# Patient Record
Sex: Male | Born: 1950 | Race: White | Hispanic: No | Marital: Married | Smoking: Never smoker
Health system: Southern US, Community
[De-identification: ages and names within clinical notes are randomized; demographics above are authoritative.]

## PROBLEM LIST (undated history)

## (undated) DIAGNOSIS — G20 Parkinson's disease: Secondary | ICD-10-CM

## (undated) DIAGNOSIS — I10 Essential (primary) hypertension: Secondary | ICD-10-CM

## (undated) DIAGNOSIS — Z87442 Personal history of urinary calculi: Secondary | ICD-10-CM

## (undated) DIAGNOSIS — N400 Enlarged prostate without lower urinary tract symptoms: Secondary | ICD-10-CM

## (undated) DIAGNOSIS — C4490 Unspecified malignant neoplasm of skin, unspecified: Secondary | ICD-10-CM

## (undated) DIAGNOSIS — N200 Calculus of kidney: Secondary | ICD-10-CM

## (undated) DIAGNOSIS — E785 Hyperlipidemia, unspecified: Secondary | ICD-10-CM

## (undated) DIAGNOSIS — I959 Hypotension, unspecified: Secondary | ICD-10-CM

## (undated) DIAGNOSIS — F329 Major depressive disorder, single episode, unspecified: Secondary | ICD-10-CM

## (undated) DIAGNOSIS — N183 Chronic kidney disease, stage 3 unspecified: Secondary | ICD-10-CM

## (undated) DIAGNOSIS — Z87898 Personal history of other specified conditions: Secondary | ICD-10-CM

## (undated) DIAGNOSIS — R55 Syncope and collapse: Secondary | ICD-10-CM

## (undated) DIAGNOSIS — R413 Other amnesia: Secondary | ICD-10-CM

## (undated) DIAGNOSIS — M1990 Unspecified osteoarthritis, unspecified site: Secondary | ICD-10-CM

## (undated) DIAGNOSIS — G4710 Hypersomnia, unspecified: Secondary | ICD-10-CM

## (undated) DIAGNOSIS — K219 Gastro-esophageal reflux disease without esophagitis: Secondary | ICD-10-CM

## (undated) HISTORY — DX: Major depressive disorder, single episode, unspecified: F32.9

## (undated) HISTORY — PX: TONSILLECTOMY: SUR1361

## (undated) HISTORY — DX: Gastro-esophageal reflux disease without esophagitis: K21.9

## (undated) HISTORY — DX: Parkinson's disease: G20

## (undated) HISTORY — DX: Hyperlipidemia, unspecified: E78.5

## (undated) HISTORY — PX: OTHER SURGICAL HISTORY: SHX169

## (undated) HISTORY — DX: Unspecified malignant neoplasm of skin, unspecified: C44.90

## (undated) HISTORY — DX: Hypersomnia, unspecified: G47.10

## (undated) HISTORY — DX: Other amnesia: R41.3

## (undated) HISTORY — DX: Essential (primary) hypertension: I10

---

## 2002-12-08 ENCOUNTER — Encounter: Payer: Self-pay | Admitting: Internal Medicine

## 2002-12-08 ENCOUNTER — Encounter: Admission: RE | Admit: 2002-12-08 | Discharge: 2002-12-08 | Payer: Self-pay | Admitting: Internal Medicine

## 2003-06-08 ENCOUNTER — Emergency Department (HOSPITAL_COMMUNITY): Admission: EM | Admit: 2003-06-08 | Discharge: 2003-06-09 | Payer: Self-pay | Admitting: Emergency Medicine

## 2005-04-09 ENCOUNTER — Ambulatory Visit: Payer: Self-pay | Admitting: Internal Medicine

## 2007-06-07 ENCOUNTER — Ambulatory Visit (HOSPITAL_COMMUNITY): Admission: RE | Admit: 2007-06-07 | Discharge: 2007-06-07 | Payer: Self-pay | Admitting: Neurosurgery

## 2007-11-30 ENCOUNTER — Ambulatory Visit (HOSPITAL_COMMUNITY): Admission: RE | Admit: 2007-11-30 | Discharge: 2007-11-30 | Payer: Self-pay | Admitting: Neurosurgery

## 2007-12-06 ENCOUNTER — Ambulatory Visit (HOSPITAL_COMMUNITY): Admission: RE | Admit: 2007-12-06 | Discharge: 2007-12-06 | Payer: Self-pay | Admitting: Neurosurgery

## 2007-12-07 ENCOUNTER — Inpatient Hospital Stay (HOSPITAL_COMMUNITY): Admission: RE | Admit: 2007-12-07 | Discharge: 2007-12-08 | Payer: Self-pay | Admitting: Neurosurgery

## 2007-12-21 ENCOUNTER — Ambulatory Visit (HOSPITAL_COMMUNITY): Admission: RE | Admit: 2007-12-21 | Discharge: 2007-12-22 | Payer: Self-pay | Admitting: Neurosurgery

## 2008-04-24 ENCOUNTER — Encounter: Payer: Self-pay | Admitting: Internal Medicine

## 2008-07-18 ENCOUNTER — Encounter: Payer: Self-pay | Admitting: Internal Medicine

## 2008-10-06 ENCOUNTER — Encounter: Payer: Self-pay | Admitting: Internal Medicine

## 2009-01-09 ENCOUNTER — Encounter: Payer: Self-pay | Admitting: Internal Medicine

## 2009-04-09 ENCOUNTER — Encounter: Payer: Self-pay | Admitting: Internal Medicine

## 2009-10-09 ENCOUNTER — Encounter: Payer: Self-pay | Admitting: Internal Medicine

## 2010-01-11 ENCOUNTER — Ambulatory Visit (HOSPITAL_COMMUNITY): Admission: RE | Admit: 2010-01-11 | Discharge: 2010-01-11 | Payer: Self-pay | Admitting: Neurosurgery

## 2010-01-25 ENCOUNTER — Encounter: Payer: Self-pay | Admitting: Internal Medicine

## 2010-05-21 NOTE — Letter (Signed)
Summary: [facility]  [facility]   Imported By: Sherian Rein 10/12/2009 10:37:47  _____________________________________________________________________  External Attachment:    Type:   Image     Comment:   External Document

## 2010-05-21 NOTE — Consult Note (Signed)
Summary: [facility]  [facility]   Imported By: Lennie Odor 01/31/2010 14:56:20  _____________________________________________________________________  External Attachment:    Type:   Image     Comment:   External Document

## 2010-05-29 ENCOUNTER — Encounter: Payer: Self-pay | Admitting: Internal Medicine

## 2010-06-12 NOTE — Letter (Signed)
Summary: Avie Echevaria MD/[facility]  Avie Echevaria MD/[facility]   Imported By: Lester Mitchell 06/03/2010 10:30:35  _____________________________________________________________________  External Attachment:    Type:   Image     Comment:   External Document

## 2010-07-04 LAB — CBC
MCHC: 34.7 g/dL (ref 30.0–36.0)
Platelets: 203 10*3/uL (ref 150–400)
RBC: 5.05 MIL/uL (ref 4.22–5.81)
RDW: 14 % (ref 11.5–15.5)
WBC: 7.9 10*3/uL (ref 4.0–10.5)

## 2010-07-04 LAB — SURGICAL PCR SCREEN: Staphylococcus aureus: NEGATIVE

## 2010-09-03 NOTE — Op Note (Signed)
John Herring, John Herring               ACCOUNT NO.:  [id number]   MEDICAL RECORD NO.:  [id number]          PATIENT TYPE:  OIB   LOCATION:  3535                         FACILITY:  [facility]   PHYSICIAN:  Danae Orleans. Venetia Maxon, M.D.  DATE OF BIRTH:  1950/10/05   DATE OF PROCEDURE:  12/21/2007  DATE OF DISCHARGE:                               OPERATIVE REPORT   PREOPERATIVE DIAGNOSIS:  Status post stage I deep brain stimulator  placement for Parkinson's disease.   POSTOPERATIVE DIAGNOSIS:  Status post stage I deep brain stimulator  placement for Parkinson's disease.   PROCEDURE:  Stage II bilateral deep brain stimulator implantable pulse  generator placement for Parkinson's.   SURGEON:  Danae Orleans. Venetia Maxon, MD   ASSISTANT:  Georgiann Cocker, RN   ANESTHESIA:  General endotracheal anesthesia.   ESTIMATED BLOOD LOSS:  Minimal.   COMPLICATIONS:  None.   DISPOSITION:  Recovery.   INDICATIONS:  John Herring is a 60 year old man with Parkinson's  disease and dyskinesias.  He had previously undergone a successful stage  I bilateral deep brain stimulator placement for Parkinson's and comes  back to the operating room for planned second stage with implantable  pulse generator and extension placement for bilateral deep brain  stimulator.   PROCEDURE:  John Herring was brought to the operating room.  Following a  satisfactory and uncomplicated induction of general endotracheal  anesthesia and placement of intravenous lines, he was placed with a  right shoulder bump on a blanket roll and doughnut head holder.  His  previously placed cranial staples were removed.  His frontal, parietal,  postauricular, and right upper chest areas were then prepped and draped  with Betadine scrub and paint.  Areas of planned incision were  infiltrated with local lidocaine.  Incision was made in the  postauricular region overlying the previously placed extensions and a  subcutaneous pocket was created over his right upper  chest.  The  tunneler was used to pass the 40 cm extensions and then these were  hooked to the lead extensions with Silastic covers, a white cover was  placed on the right lead, and clear cover was placed on the left lead.  The connections were torqued appropriately, and Silastic cover was  placed and 2-0 silk ties were placed on either end of the covers.  The  03 connections were placed on the left lead.  The 437 connections were  placed on the right lead with a connector battery and the connections  were torqued appropriately.  The wounds were then irrigated.  The  battery  was placed within the subcutaneous pocket.  The wounds were irrigated  and closed with interrupted 2-0 and 3-0 Vicryl sutures.  Chest and 2-0  Vicryl stitches reapproximated to the galea and then 3-0 nylon running  lock stitch.  Sterile occlusive dressing was placed.  The patient was  taken to the recovery having tolerated the procedure well.      Danae Orleans. Venetia Maxon, M.D.  Electronically Signed     JDS/MEDQ  D:  12/21/2007  T:  12/22/2007  Job:  [id number]

## 2010-09-03 NOTE — Op Note (Signed)
NAMELIBAN, GUEDES               ACCOUNT NO.:  [id number]   MEDICAL RECORD NO.:  [id number]          PATIENT TYPE:  AMB   LOCATION:  SDS                          FACILITY:  [facility]   PHYSICIAN:  Danae Orleans. Venetia Maxon, M.D.  DATE OF BIRTH:  Aug 23, 1950   DATE OF PROCEDURE:  12/07/2007  DATE OF DISCHARGE:  12/08/2007                               OPERATIVE REPORT   PREOPERATIVE DIAGNOSIS:  Parkinson's disease refractory to medications.   POSTOPERATIVE DIAGNOSIS:  Parkinson's disease refractory to medications.   PROCEDURE:  CT and MRI guided stereotactic subthalamic nucleus deep  brain stimulator placement with microelectrode recordings.   SURGEON:  Danae Orleans. Venetia Maxon, MD   ASSISTANT:  Georgiann Cocker, RN   ANESTHESIA:  Monitored sedation with local lidocaine.   FINDINGS:  Excellent microelectrode recordings through long stretches of  subthalamic nucleus bilaterally with good response to stimulation  bilaterally.   ESTIMATED BLOOD LOSS:  Minimal.   COMPLICATIONS:  None.   DISPOSITION:  Recovery.   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  He has been managed medically, but is  having progressively increasing problems with dyskinesias.  It was  elected to take him to surgery for placement of bilateral deep brain  stimulator electrodes.  This was to be done in a frameless fashion.  He  initially underwent an MRI of his brain without fiducials then had  fiducial markers placed in his skull in the office yesterday, had a CT  scan with fiducials and then preoperative planning was done the night  before surgery on the Stealth system to planned trajectories in  targeting for bilateral subthalamic nucleus electrodes.   PROCEDURE:  John Herring was brought to the operating room and placed in  supine semi-Fowler's position on the neck holder and with intravenous  sedation using Precedex.  The patient then had registration of his skull  with the fiducial markers to an accuracy of 0.4 mm.  I then prepped his  scalp after positioning the planned entry points and infiltrated the  skin with lidocaine with epinephrine, scored the scalp, and then drilled  2 marker holes in the skull to orient the bur holes.  The patient's  scalp was then prepped and draped in the usual sterile fashion and area  of planned incision was infiltrated with local lidocaine with  epinephrine.  A linear incision was made posterior to the coronal suture  curving forward on either ends and Raney clips were applied.  The scalp  was brought forward and the previously placed skull markers were  identified and the bur holes were then placed.  The dura was cauterized  with bipolar electrocautery and the bone bleeding was stopped with bone  wax.  The stim-lock caps were then placed and then the tower was created  on the left initially.  The dura was incised in a cruciate fashion.  Erie Noe  was cauterized with bipolar electrocautery, and using the Stealth  neuronavigation system, the left STN trajectory was established and with  planning to target, the microelectrode drive was then set at 87 mm  which  was the planned length to target, and using the microelectrode  recording, the microelectrode was started at 15 mm above target, and  initially the microelectrode recordings were suboptimal and the  electrode was changed and had much better recordings.  We received  indications of thalamic activity at 13 mm above target to 10 mm above  target and then subthalamic nucleus from 6 mm above target to 2 mm below  target and entered substantia nigra reticulata at 3.5 mm below target.  It was elected to put the stimulator to a depth of 2 mm below target and  the stimulating electrode was then placed, and with a stimulating  electrode at relatively low settings, the patient had complete cessation  of tremor and no evidence of rigidity.  On the right side of his body,  he had  transient paresthesias in his fingers and tips of fingers which  rapidly resolved.  The electrode was then locked in position with the  skin lock cap and the tower was disassembled.  A long extension was then  placed and the attention was then turned to the right side where the  tower was again reassembled.  The skull was reregistered, again with 0.4  mm accuracy, and microelectrode recordings were performed after  cauterizing the dura opening in a cruciate fashion and cauterizing the  pia.  On the right side, there was thalamic activity from 13 mm above  target to 8 mm above target and subthalamic nucleus activity from 4 mm  above target to 1.5 mm below target.  This made sense based on the fact  that we had taken somewhat more lateral and anterior trajectory to avoid  a sulcus on the initial plan.  We entered substantia nigra reticulata at  3.5 mm below target.  We set the depth of the stimulating electrode to  1.5 mm below target and then placed the stimulating electrode and with  that the patient had good relief of left-sided tremor and rigidity.  He  had transient paresthesias in both his hand and in his leg, more in the  hand than the leg and these resolved rapidly.  It was felt that this  electrode was well positioned and was locked down and the tower was  disassembled.  The caps were placed.  The redundant electrode was  circularized and placed in the right subgaleal pocket on the posterior  auricular region.  The right-sided extension was cut short to be able to  differentiate from the left side.  The wound was then irrigated and  closed with 2-0 Vicryl galea stitches and staples.  The fiducial markers  were removed.  Sterile occlusive head wrap was placed.  The patient was  taken to recovery room in stable and satisfactory condition having  tolerated the procedure well.      Danae Orleans. Venetia Maxon, M.D.  Electronically Signed     JDS/MEDQ  D:  12/07/2007  T:  12/08/2007  Job:   [id number]

## 2011-01-17 LAB — CBC
Hemoglobin: 16.1
MCHC: 33.7
MCV: 99
RBC: 4.84
WBC: 7.5

## 2011-01-20 ENCOUNTER — Other Ambulatory Visit: Payer: Self-pay | Admitting: Neurology

## 2011-01-20 ENCOUNTER — Ambulatory Visit
Admission: RE | Admit: 2011-01-20 | Discharge: 2011-01-20 | Disposition: A | Discharge status: Home / Self Care | Payer: Medicare Other | Source: Ambulatory Visit | Attending: Neurology | Admitting: Neurology

## 2011-01-20 DIAGNOSIS — M79671 Pain in right foot: Secondary | ICD-10-CM

## 2011-04-22 HISTORY — PX: COLONOSCOPY: SHX174

## 2011-05-05 ENCOUNTER — Telehealth: Payer: Self-pay

## 2011-05-05 DIAGNOSIS — Z0000 Encounter for general adult medical examination without abnormal findings: Secondary | ICD-10-CM

## 2011-05-05 DIAGNOSIS — Z1289 Encounter for screening for malignant neoplasm of other sites: Secondary | ICD-10-CM

## 2011-05-05 NOTE — Telephone Encounter (Signed)
Put order in for physical labs. 

## 2011-05-06 ENCOUNTER — Encounter: Payer: Self-pay | Admitting: Internal Medicine

## 2011-05-06 DIAGNOSIS — G20 Parkinson's disease: Secondary | ICD-10-CM

## 2011-05-06 DIAGNOSIS — F32A Depression, unspecified: Secondary | ICD-10-CM

## 2011-05-06 DIAGNOSIS — G4733 Obstructive sleep apnea (adult) (pediatric): Secondary | ICD-10-CM | POA: Insufficient documentation

## 2011-05-06 DIAGNOSIS — E785 Hyperlipidemia, unspecified: Secondary | ICD-10-CM

## 2011-05-06 DIAGNOSIS — G20A1 Parkinson's disease without dyskinesia, without mention of fluctuations: Secondary | ICD-10-CM

## 2011-05-06 DIAGNOSIS — F329 Major depressive disorder, single episode, unspecified: Secondary | ICD-10-CM | POA: Insufficient documentation

## 2011-05-06 DIAGNOSIS — G4710 Hypersomnia, unspecified: Secondary | ICD-10-CM

## 2011-05-06 DIAGNOSIS — C4490 Unspecified malignant neoplasm of skin, unspecified: Secondary | ICD-10-CM

## 2011-05-06 HISTORY — DX: Unspecified malignant neoplasm of skin, unspecified: C44.90

## 2011-05-06 HISTORY — DX: Parkinson's disease: G20

## 2011-05-06 HISTORY — DX: Hypersomnia, unspecified: G47.10

## 2011-05-06 HISTORY — DX: Hyperlipidemia, unspecified: E78.5

## 2011-05-06 HISTORY — DX: Parkinson's disease without dyskinesia, without mention of fluctuations: G20.A1

## 2011-05-06 HISTORY — DX: Depression, unspecified: F32.A

## 2011-06-08 ENCOUNTER — Encounter: Payer: Self-pay | Admitting: Internal Medicine

## 2011-06-08 DIAGNOSIS — Z0000 Encounter for general adult medical examination without abnormal findings: Secondary | ICD-10-CM | POA: Insufficient documentation

## 2011-06-08 DIAGNOSIS — Z0001 Encounter for general adult medical examination with abnormal findings: Secondary | ICD-10-CM | POA: Insufficient documentation

## 2011-06-13 ENCOUNTER — Encounter: Payer: Self-pay | Admitting: Internal Medicine

## 2011-06-13 ENCOUNTER — Other Ambulatory Visit (INDEPENDENT_AMBULATORY_CARE_PROVIDER_SITE_OTHER): Payer: BC Managed Care – PPO

## 2011-06-13 ENCOUNTER — Ambulatory Visit (INDEPENDENT_AMBULATORY_CARE_PROVIDER_SITE_OTHER): Payer: BC Managed Care – PPO | Admitting: Internal Medicine

## 2011-06-13 VITALS — BP 112/78 | HR 93 | Temp 98.8°F | Ht 70.0 in | Wt 189.5 lb

## 2011-06-13 DIAGNOSIS — Z0000 Encounter for general adult medical examination without abnormal findings: Secondary | ICD-10-CM

## 2011-06-13 DIAGNOSIS — F329 Major depressive disorder, single episode, unspecified: Secondary | ICD-10-CM

## 2011-06-13 DIAGNOSIS — M25519 Pain in unspecified shoulder: Secondary | ICD-10-CM

## 2011-06-13 DIAGNOSIS — M25512 Pain in left shoulder: Secondary | ICD-10-CM | POA: Insufficient documentation

## 2011-06-13 DIAGNOSIS — F1020 Alcohol dependence, uncomplicated: Secondary | ICD-10-CM

## 2011-06-13 DIAGNOSIS — F3289 Other specified depressive episodes: Secondary | ICD-10-CM

## 2011-06-13 DIAGNOSIS — M25511 Pain in right shoulder: Secondary | ICD-10-CM

## 2011-06-13 LAB — URINALYSIS, ROUTINE W REFLEX MICROSCOPIC
Bilirubin Urine: NEGATIVE
Hgb urine dipstick: NEGATIVE
Total Protein, Urine: NEGATIVE
Urine Glucose: NEGATIVE
Urobilinogen, UA: 0.2 (ref 0.0–1.0)

## 2011-06-13 LAB — BASIC METABOLIC PANEL
CO2: 25 mEq/L (ref 19–32)
Calcium: 9.8 mg/dL (ref 8.4–10.5)
Chloride: 104 mEq/L (ref 96–112)
Creatinine, Ser: 1.3 mg/dL (ref 0.4–1.5)
Sodium: 138 mEq/L (ref 135–145)

## 2011-06-13 LAB — CBC WITH DIFFERENTIAL/PLATELET
Basophils Absolute: 0 10*3/uL (ref 0.0–0.1)
Basophils Relative: 0.6 % (ref 0.0–3.0)
Eosinophils Absolute: 0 10*3/uL (ref 0.0–0.7)
Hemoglobin: 15.5 g/dL (ref 13.0–17.0)
Lymphocytes Relative: 23.8 % (ref 12.0–46.0)
MCHC: 33.6 g/dL (ref 30.0–36.0)
MCV: 97.3 fl (ref 78.0–100.0)
Monocytes Absolute: 0.6 10*3/uL (ref 0.1–1.0)
Neutro Abs: 5.3 10*3/uL (ref 1.4–7.7)
RBC: 4.72 Mil/uL (ref 4.22–5.81)
RDW: 14.1 % (ref 11.5–14.6)

## 2011-06-13 LAB — LIPID PANEL
Total CHOL/HDL Ratio: 4
VLDL: 34.6 mg/dL (ref 0.0–40.0)

## 2011-06-13 LAB — HEPATIC FUNCTION PANEL
ALT: 6 U/L (ref 0–53)
Alkaline Phosphatase: 60 U/L (ref 39–117)
Bilirubin, Direct: 0.1 mg/dL (ref 0.0–0.3)
Total Bilirubin: 0.5 mg/dL (ref 0.3–1.2)
Total Protein: 6.8 g/dL (ref 6.0–8.3)

## 2011-06-13 LAB — PSA: PSA: 2.42 ng/mL (ref 0.10–4.00)

## 2011-06-13 LAB — LDL CHOLESTEROL, DIRECT: Direct LDL: 179.1 mg/dL

## 2011-06-13 MED ORDER — ESCITALOPRAM OXALATE 10 MG PO TABS: 10.0000 mg | ORAL_TABLET | Freq: Every day | ORAL | Status: DC

## 2011-06-13 MED ORDER — MELOXICAM 15 MG PO TABS: 15.0000 mg | ORAL_TABLET | Freq: Every day | ORAL | Status: DC

## 2011-06-13 NOTE — Assessment & Plan Note (Signed)
To cont AA involvement, pt encouraged

## 2011-06-13 NOTE — Assessment & Plan Note (Signed)
Unclear etiology, for mobic prn, declines ortho eval or films,  to f/u any worsening symptoms or concerns

## 2011-06-13 NOTE — Assessment & Plan Note (Signed)
Mild, verified nonsuicidal, for lexapro 10 mg qd,  to f/u any worsening symptoms or concerns

## 2011-06-13 NOTE — Progress Notes (Signed)
Subjective:    Patient ID: John Herring, male    DOB: 1950/11/06, 61 y.o.   MRN: [id number]  HPI  Here for wellness and f/u;  Overall doing ok;  Pt denies CP, worsening SOB, DOE, wheezing, orthopnea, PND, worsening LE edema, palpitations, dizziness or syncope.  Pt denies neurological change such as new Headache, facial or extremity weakness.  Pt denies polydipsia, polyuria, or low sugar symptoms. Pt states overall good compliance with treatment and medications, good tolerability, and trying to follow lower cholesterol diet.  Pt denies worsening depressive symptoms, suicidal ideation or panic. No fever, wt loss, night sweats, loss of appetite, or other constitutional symptoms.  Pt states good ability with ADL's, low fall risk, home safety reviewed and adequate, no significant changes in hearing or vision, and occasionally active with exercise.  Sees Dr Sandria Manly every 3 mo for Parkinson's.  Has worsening depression but no suicidal ideation, or panic.  Has ongoing alcoholism as well, now quit and going to AA.  Also with bilat shoulder aching, worse intemittent, mild, without neck pain or radicular symtpoms. Past Medical History  Diagnosis Date  . 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 Hypersomnia 05/06/2011  . Hyperlipidemia 05/06/2011  . Skin cancer 05/06/2011  . Depression 05/06/2011  . OSA (obstructive sleep apnea) 05/06/2011   No past surgical history on file.  reports that he has never smoked. He does not have any smokeless tobacco history on file. He reports that he drinks alcohol. He reports that he does not use illicit drugs. family history is not on file. No Known Allergies No current outpatient prescriptions on file prior to visit.   Review of Systems Review of Systems  Constitutional: Negative for diaphoresis, activity change, appetite change and unexpected weight change.  HENT: Negative for hearing loss, ear pain, facial swelling, mouth sores and neck  stiffness.   Eyes: Negative for pain, redness and visual disturbance.  Respiratory: Negative for shortness of breath and wheezing.   Cardiovascular: Negative for chest pain and palpitations.  Gastrointestinal: Negative for diarrhea, blood in stool, abdominal distention and rectal pain.  Genitourinary: Negative for hematuria, flank pain and decreased urine volume.  Musculoskeletal: Negative for myalgias and joint swelling.  Skin: Negative for color change and wound.  Neurological: Negative for syncope and numbness.  Hematological: Negative for adenopathy.  Psychiatric/Behavioral: Negative for hallucinations, self-injury, decreased concentration and agitation.      Objective:   Physical Exam BP 112/78  Pulse 93  Temp(Src) 98.8 F (37.1 C) (Oral)  Ht 5\' 10"  (1.778 m)  Wt 189 lb 8 oz (85.957 kg)  BMI 27.19 kg/m2  SpO2 91% Physical Exam  VS noted Constitutional: Pt is oriented to person, place, and time. Appears well-developed and well-nourished.  HENT:  Head: Normocephalic and atraumatic.  Right Ear: External ear normal.  Left Ear: External ear normal.  Nose: Nose normal.  Mouth/Throat: Oropharynx is clear and moist.  Eyes: Conjunctivae and EOM are normal. Pupils are equal, round, and reactive to light.  Neck: Normal range of motion. Neck supple. No JVD present. No tracheal deviation present.  Cardiovascular: Normal rate, regular rhythm, normal heart sounds and intact distal pulses.   Pulmonary/Chest: Effort normal and breath sounds normal.  Abdominal: Soft. Bowel sounds are normal. There is no tenderness.  Musculoskeletal: Normal range of motion. Exhibits no edema.  Lymphadenopathy:  Has no cervical adenopathy.  Neurological: Pt is alert and oriented to person, place, and time. Pt has normal reflexes. No cranial  nerve deficit.  Skin: Skin is warm and dry. No rash noted.  Psychiatric:  Has depressed mood and affect. Behavior is normal.  Bilat shoulders FROM, NT    Assessment &  Plan:

## 2011-06-13 NOTE — Assessment & Plan Note (Signed)

## 2011-06-13 NOTE — Patient Instructions (Addendum)
Take all new medications as prescribed - the pain medication as needed for shoulder pain, and the lexapro 10 mg for depression Continue all other medications as before Please keep your appointments with your specialists as you have planned - Dr Love/neurology, and AA meetings You will be contacted regarding the referral for: Gastroenterology for purpose of planning the screening colonoscopy Please make Nurse Appt at any time for Shingles Shot if you wish to have this done You are otherwise up to date with prevention Please go to LAB in the Basement for the blood and/or urine tests to be done today Please call the phone number [phone] (the PhoneTree System) for results of testing in 2-3 days;  When calling, simply dial the number, and when prompted enter the MRN number above (the Medical Record Number) and the # key, then the message should start. Please return in 1 year for your yearly visit, or sooner if needed, with Lab testing done 3-5 days before

## 2011-06-15 ENCOUNTER — Other Ambulatory Visit: Payer: Self-pay | Admitting: Internal Medicine

## 2011-06-15 DIAGNOSIS — E785 Hyperlipidemia, unspecified: Secondary | ICD-10-CM

## 2011-06-15 DIAGNOSIS — Z79899 Other long term (current) drug therapy: Secondary | ICD-10-CM

## 2011-06-15 MED ORDER — ATORVASTATIN CALCIUM 20 MG PO TABS: 20.0000 mg | ORAL_TABLET | Freq: Every day | ORAL | Status: DC

## 2011-07-11 ENCOUNTER — Encounter: Payer: Self-pay | Admitting: Internal Medicine

## 2011-07-11 ENCOUNTER — Other Ambulatory Visit (INDEPENDENT_AMBULATORY_CARE_PROVIDER_SITE_OTHER): Payer: BC Managed Care – PPO

## 2011-07-11 DIAGNOSIS — Z79899 Other long term (current) drug therapy: Secondary | ICD-10-CM

## 2011-07-11 DIAGNOSIS — E785 Hyperlipidemia, unspecified: Secondary | ICD-10-CM

## 2011-07-11 LAB — LIPID PANEL
Cholesterol: 169 mg/dL (ref 0–200)
HDL: 57.6 mg/dL (ref >39.00)
LDL Cholesterol: 87 mg/dL (ref 0–99)
Total CHOL/HDL Ratio: 3
Triglycerides: 123 mg/dL (ref 0.0–149.0)
VLDL: 24.6 mg/dL (ref 0.0–40.0)

## 2011-07-11 LAB — HEPATIC FUNCTION PANEL
AST: 20 U/L (ref 0–37)
Alkaline Phosphatase: 57 U/L (ref 39–117)
Bilirubin, Direct: 0.1 mg/dL (ref 0.0–0.3)
Total Bilirubin: 0.4 mg/dL (ref 0.3–1.2)

## 2011-10-28 ENCOUNTER — Encounter: Payer: Self-pay | Admitting: Internal Medicine

## 2011-12-26 ENCOUNTER — Ambulatory Visit (AMBULATORY_SURGERY_CENTER): Payer: BC Managed Care – PPO

## 2011-12-26 VITALS — Ht 70.5 in | Wt 195.9 lb

## 2011-12-26 DIAGNOSIS — Z1211 Encounter for screening for malignant neoplasm of colon: Secondary | ICD-10-CM

## 2012-01-09 ENCOUNTER — Encounter: Payer: Self-pay | Admitting: Internal Medicine

## 2012-01-09 ENCOUNTER — Ambulatory Visit (AMBULATORY_SURGERY_CENTER): Payer: BC Managed Care – PPO | Admitting: Internal Medicine

## 2012-01-09 VITALS — BP 142/88 | HR 90 | Temp 97.3°F | Resp 19 | Ht 70.0 in | Wt 195.0 lb

## 2012-01-09 DIAGNOSIS — Z1211 Encounter for screening for malignant neoplasm of colon: Secondary | ICD-10-CM

## 2012-01-09 DIAGNOSIS — D126 Benign neoplasm of colon, unspecified: Secondary | ICD-10-CM

## 2012-01-09 MED ORDER — SODIUM CHLORIDE 0.9 % IV SOLN: 500.0000 mL | INTRAVENOUS | Status: DC

## 2012-01-09 NOTE — Patient Instructions (Addendum)

## 2012-01-09 NOTE — Progress Notes (Addendum)
Patient did not have preoperative order for IV antibiotic SSI prophylaxis. (G8918)  Patient did not experience any of the following events: a burn prior to discharge; a fall within the facility; wrong site/side/patient/procedure/implant event; or a hospital transfer or hospital admission upon discharge from the facility. (G8907)  

## 2012-01-09 NOTE — Progress Notes (Signed)
BRAIN STIMULATOR IN PLACE,WITH BATTERY PACK RT. CHEST.

## 2012-01-09 NOTE — Op Note (Signed)
[facility] [address] [place], [address]   COLONOSCOPY PROCEDURE REPORT  PATIENT: John, Herring  MR#: [id number] BIRTHDATE: [birth year] , 61  yrs. old GENDER: Male ENDOSCOPIST: Beverley Fiedler, MD REFERRED AO:ZHYQ, Fayrene Fearing PROCEDURE DATE:  01/09/2012 PROCEDURE:   Colonoscopy with cold biopsy polypectomy ASA CLASS:   Class III INDICATIONS:average risk screening and first colonoscopy. MEDICATIONS: MAC sedation, administered by CRNA and propofol (Diprivan) 200mg  IV  DESCRIPTION OF PROCEDURE:   After the risks benefits and alternatives of the procedure were thoroughly explained, informed consent was obtained.  A digital rectal exam revealed no rectal mass.   The LB CF-[id number]  endoscope was introduced through the anus and advanced to the cecum, which was identified by both the appendix and ileocecal valve. No adverse events experienced. The quality of the prep was Suprep good  The instrument was then slowly withdrawn as the colon was fully examined.   COLON FINDINGS: Mild diverticulosis was noted in the sigmoid colon. A sessile polyp measuring 4 mm in size was found in the descending colon.  A polypectomy was performed with cold forceps.  The resection was complete and the polyp tissue was completely retrieved.   Moderate sized internal hemorrhoids were found. Retroflexed views revealed internal hemorrhoids. The time to cecum=3 minutes 39 seconds.  Withdrawal time=10 minutes 27 seconds. The scope was withdrawn and the procedure completed.  COMPLICATIONS: There were no complications.  ENDOSCOPIC IMPRESSION: 1.   Mild diverticulosis was noted in the sigmoid colon 2.   Sessile polyp measuring 4 mm in size was found in the descending colon; polypectomy was performed with cold forceps 3.   Moderate sized internal hemorrhoids  RECOMMENDATIONS: 1.  Await pathology results 2.  High fiber diet 3.  If the polyp removed today is proven to be an  adenomatous (pre-cancerous) polyp, you will need a repeat colonoscopy in 5 years.  Otherwise you should continue to follow colorectal cancer screening guidelines for "routine risk" patients with colonoscopy in 10 years.  You will receive a letter within 1-2 weeks with the results of your biopsy as well as final recommendations.  Please call my office if you have not received a letter after 3 weeks.  eSigned:  Beverley Fiedler, MD 01/09/2012 10:42 AM  cc: Corwin Levins, MD and The Patient   PATIENT NAME:  John, Herring MR#: [id number]

## 2012-01-12 ENCOUNTER — Telehealth: Payer: Self-pay | Admitting: *Deleted

## 2012-01-12 NOTE — Telephone Encounter (Signed)
NO ANSWER, MESSAGE LEFT FOR THE PATIENT. 

## 2012-01-13 ENCOUNTER — Encounter: Payer: Self-pay | Admitting: Internal Medicine

## 2012-03-08 ENCOUNTER — Other Ambulatory Visit: Payer: Self-pay | Admitting: Internal Medicine

## 2012-06-08 ENCOUNTER — Other Ambulatory Visit: Payer: Self-pay | Admitting: Internal Medicine

## 2012-06-11 ENCOUNTER — Other Ambulatory Visit: Payer: Self-pay | Admitting: Internal Medicine

## 2012-06-12 ENCOUNTER — Encounter: Payer: Self-pay | Admitting: Neurology

## 2012-06-12 DIAGNOSIS — M79609 Pain in unspecified limb: Secondary | ICD-10-CM | POA: Insufficient documentation

## 2012-06-12 DIAGNOSIS — I951 Orthostatic hypotension: Secondary | ICD-10-CM

## 2012-07-12 ENCOUNTER — Encounter: Payer: Self-pay | Admitting: Neurology

## 2012-07-12 ENCOUNTER — Ambulatory Visit (INDEPENDENT_AMBULATORY_CARE_PROVIDER_SITE_OTHER): Payer: BC Managed Care – PPO | Admitting: Neurology

## 2012-07-12 VITALS — BP 124/80 | HR 80 | Temp 97.6°F | Ht 70.0 in | Wt 199.0 lb

## 2012-07-12 DIAGNOSIS — G20 Parkinson's disease: Secondary | ICD-10-CM

## 2012-07-12 DIAGNOSIS — R413 Other amnesia: Secondary | ICD-10-CM

## 2012-07-12 DIAGNOSIS — I951 Orthostatic hypotension: Secondary | ICD-10-CM

## 2012-07-12 DIAGNOSIS — F329 Major depressive disorder, single episode, unspecified: Secondary | ICD-10-CM

## 2012-07-12 DIAGNOSIS — G4710 Hypersomnia, unspecified: Secondary | ICD-10-CM

## 2012-07-12 HISTORY — DX: Other amnesia: R41.3

## 2012-07-12 MED ORDER — MIDODRINE HCL 5 MG PO TABS: ORAL_TABLET | ORAL | Status: DC

## 2012-07-12 NOTE — Patient Instructions (Addendum)
I think overall you're doing fairly well but I do want to suggest a few changes today:   Remember to drink plenty of fluid, eat healthy meals and do not skip any meals. Try to eat protein with a every meal and eat a snack in between meals. Try to keep a regular sleep-wake schedule and try to exercise daily, particularly in the form of walking, 20-30 minutes a day. Try to use a stationary bike or pedals as much as you can to keep the foot pump going. You still may add extra salt at times, if you BP is on the lower side. Monitor BP daily for the next few days, then every few days.   I would like to see you back in 3 months, sooner if the need arises. Please call us with any interim questions, concerns, problems, updates or refill requests.  Please also call us for any test results so we can go over those with you on the phone. Brett Canales is my clinical assistant and will answer any of your questions and relay your messages to me.  Our phone number is [phone].  When you see Dr. Rubin Payor next month please discuss with him whether you could come off of the Azilect to see in the dyskinesias improve. You can always restarted if needed. I renewed you Midodrine prescription today.

## 2012-07-12 NOTE — Progress Notes (Signed)
Subjective:    Patient ID: John Herring is a 62 y.o. male.  HPI Interim history:  John Herring is a very pleasant 62 year old right-handed gentleman with a approximately ten-year history of Parkinson's disease who presents for followup consultation. He is a former patient of John Herring and has been followed by him for the past several years. This is his first visit with me today and he is accompanied by his wife. He was last seen by John Herring on 06/10/2012 at which time he has had some problems with low blood pressure values and he had recently been placed on midodrine for that. This was increased and he was asked to come back in a month for rechecking how his blood pressure values have been. The patient is also followed by John Herring at [facility] and has a history of DBS placed in September 2009. He was last seen for DBS check and programming by John Herring in 1/14. He has had ongoing issues with low BP upon standing with a sense of pre-syncope. In fact, he had an actual syncopal spell. He was up in the morning and was walking the dog and was outside, had bent over and when he straightened up, he fainted. He woke up in the yard, but had no injuries thankfully. His wife was at work. They called John Herring and he was instructed to increase his midodrine from 5 mg tid to 7.5 mg in AM, then 5 mg and 5mg  for the rest of the day. He has been eating more salt. He tries to drink plenty of fluid, but his wife believes, he could do better.  Since his DBS he came off of the Requip altogether and changed from Stalevo to C/L with gradual increase in the dose of the C/L. Does not have much in the way of motor fluctuations at this time. Azilect was added about 6 mo ago and perhaps his dyskinesias are worse since the Azilect was started.  I reviewed his prior notes with John Herring and his old records and below is a summary of that review:  62 year old RH man with an 9-1/2 year history of Parkinson's disease initially  treated with Requip, complicated by dyskinesias. In 2006 C/L was added and in June 2007 he was changed to Stalevo. Zelapar was added in July 2008. He had bilateral DBS in 12/21/2007 and has had problems with motor fluctuations, dyskinesias involving the left side of his body, and  poor response on the right with parkinsonian symptoms. He had changes in his antidepressant medicines which seemed to be associated with an increase in dyskinesias. John Herring decreased his stimulation but then had to  increase it because of the development of significant Parkinsonian signs. His DBS was adjusted in  10/2011 by John Herring and he had a battery change in 02/2012. His dyskinesias on the left side of his body continue but improved after recent change in settings by John Herring. He has slurred speech, difficulty writing, a feeling like "zombie" and "not in my body", and at times marked bradykinesia with difficulty" telling his legs to move" He notes wearing off before the next dose. He has daytime sleepiness and sleeps from 3 PM - 11PM gets up, and then goes back to sleep until the AM. ESS has been in the 13-16 range. Ritalin is helpful. He is independent in his ADLs and drives, but only in restricted areas. He denies bowel or bladder incontinence, obsessive-compulsive behavior, or hallucinations. He has postural dizziness. No history  of dystonia. He has a dizzy sensation with poor balance and memory loss. He is not using a cane or a walker.  He notes  drooling. He walks 1 mile 2 times per day. He has painful  right rotator cuff disease. He requires assistance with some fine motor skills. He has had recurrent falls. Pulse oximetry in the past did not show evidence of intermittent desaturations concerning for OSA. He used to see John Herring for his depression. He also has anxiety and apathy. He renewed his driver's license 04/6107. He saw John Herring, an orthopedist regarding a R foot fracture and John Herring regarding his right  shoulder. He had 2 injections in his right shoulder but his symptoms are now recurring. He is on Methylphenidate 5 mg t.i.d. for daytime sleepiness. He has pseudobulbar affect but cannot afford Nudexta. He visited his son in [place] and did well. On 12/19/2011 = MMSE 28/30, CDT 4/4, AFT 15. He has had like he is going to pass out but has not passed out. On 05/26/2012 = MMSE 28/30, CDT 4/4, AFT 11. On 06/10/12 patient returned for a BP check after being placed on Midodrin 2.5 mg tid. No syncope was reported at that visit.      Past Medical History  Diagnosis Date  . 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 Hypersomnia 05/06/2011  . Hyperlipidemia 05/06/2011  . Skin cancer 05/06/2011  . Depression 05/06/2011  . OSA (obstructive sleep apnea) 05/06/2011     Past Surgical History  Procedure Laterality Date  . Deep brain stimulation      Parkinson's disease    Family History  Problem Relation Age of Onset  . Colon cancer Neg Hx    History   Social History  . Marital Status: Married    Spouse Name: N/A    Number of Children: 2  . Years of Education: 18   Occupational History  . Disabled    Social History Main Topics  . Smoking status: Former Games developer  . Smokeless tobacco: Never Used  . Alcohol Use: No  . Drug Use: No  . Sexually Active: None   Other Topics Concern  . None   Social History Narrative  . None    No Known Allergies:   His Outpatient Encounter Prescriptions as of 07/12/2012  Medication Sig Dispense Refill  . atorvastatin (LIPITOR) 20 MG tablet TAKE 1 TABLET BY MOUTH DAILY.  90 tablet  0  . AZILECT 1 MG TABS       . carbidopa-levodopa (SINEMET) 10-100 MG per tablet Take 1 tablet by mouth 4 (four) times daily.      . carbidopa-levodopa (SINEMET) 25-100 MG per tablet 6 (six) times daily. Take 1/2 twice daily and 1 by mouth daily      . escitalopram (LEXAPRO) 10 MG tablet Take 1 tablet (10 mg total) by mouth daily.  90 tablet  3  .  meloxicam (MOBIC) 15 MG tablet TAKE 1 TABLET BY MOUTH EVERY DAY  90 tablet  1  . methylphenidate (RITALIN) 5 MG tablet Take 5 mg by mouth 3 (three) times daily.      . midodrine (PROAMATINE) 2.5 MG tablet Take 2.5 mg by mouth 3 (three) times daily.      . [DISCONTINUED] atorvastatin (LIPITOR) 20 MG tablet TAKE 1 TABLET BY MOUTH DAILY.  90 tablet  0   No facility-administered encounter medications on file as of 07/12/2012.  :  Review of Systems  Constitutional: Positive for fatigue.  HENT: Positive for sneezing and tinnitus.   Eyes: Positive for visual disturbance (Blurred / double vision).  Respiratory: Positive for cough.        Snoring  Musculoskeletal: Positive for myalgias, joint swelling and arthralgias.  Neurological: Positive for dizziness, tremors, syncope, speech difficulty, weakness and light-headedness.  Psychiatric/Behavioral: Positive for sleep disturbance and dysphoric mood.       Decreased energy,Anxiety, depression    Objective:  Neurologic Exam  Physical Exam  Physical Examination:   Filed Vitals:   07/12/12 0951  BP: 124/80  Pulse: 80  Temp: 97.6 F (36.4 C)    on repeat orthostatic testing, his sitting blood pressure was 153/92 with a pulse of 92 and his standing blood pressure was 141/80 with a pulse of 89. He did not have much in the way of lightheadedness upon standing.  General Examination: The patient is a very pleasant 62 y.o. male in no acute distress.  HEENT: Normocephalic, atraumatic, pupils are equal, round and reactive to light and accommodation. Extraocular tracking is mildly impaired without nystagmus noted. Hearing is grossly intact. Face is mildly masked with mild facial dyskinesias noted. Speech is soft with no dysarthria noted. There is no lip, neck or jaw tremor. Neck is very mildly rigid with full range of motion. Oropharynx exam reveals normal findings. No significant airway crowding is noted. Mallampati is class I. Tongue protrudes  centrally and palate elevates symmetrically.  Chest: is clear to auscultation without wheezing, rhonchi or crackles noted.  Heart: sounds are normal without murmurs, rubs or gallops noted.  Abdomen: is soft, non-tender and non-distended with normal bowel sounds appreciated on auscultation.  Extremities: There is no pitting edema in the distal lower extremities bilaterally. Pedal pulses are intact. He is wearing compression socks to the knees.   Skin: is warm and dry with no trophic changes noted.  Musculoskeletal: exam reveals no obvious joint deformities, tenderness or joint swelling or erythema. He has moderate primarily left-sided dyskinesias, severe at times.  Neurologically:  Mental status: The patient is awake, alert and oriented in all 4 spheres. His memory, attention, language and knowledge are appropriate. There is no aphasia, agnosia, apraxia or anomia. Speech is clear with normal prosody and enunciation. Thought process is linear. Mood is congruent. Affect is normal.  Cranial nerves are as described above under HEENT exam. In addition, shoulder shrug is normal with left shoulder height higher. Motor exam: Normal bulk, and strength is noted. Tone is mildly increased throughout.  He intermittent mild to moderate resting tremor right more so than left. Romberg is negative. Reflexes are 2+ throughout. Toes are downgoing bilaterally. Fine motor skills are  moderately impaired bilaterally with finger taps, hand movements and rapid alternating patting, worse on the right but with more pronounced dyskinesias on the left. Foot taps are mildly impaired on the left and mild to moderately so on the right.   Cerebellar testing shows no dysmetria or intention tremor on finger to nose testing. There is no truncal or gait ataxia but he does have mild truncal dyskinesias at times.  Sensory exam is intact to light touch throughout.    he is able to stand without assistance and his posture is moderately  stooped, he has a tendency to lean to his left. He walks with some insecurity and turns with 3 steps. His balance is mildly impaired.                Assessment and Plan:    Assessment and Plan:  In  summary, FRUTOSO DIMARE is a very pleasant 62 y.o.-year old male with a  approximately ten-year history of idiopathic Parkinson's disease, right-sided predominant, status post bilateral DBS in September 2009 with interim battery change in 2011. He is due for battery check and change probably any time. He is due for followup for programming and DBS check with John Herring next month.  I had a long chat with the patient and  his wife  about my findings and the diagnosis, its prognosis and treatment options. We talked about medical treatments and non-pharmacological approaches. We talked about maintaining a  healthy lifestyle in general. I encouraged the patient to eat healthy, exercise daily and keep well hydrated, to keep a scheduled bedtime and wake time routine, to not skip any meals and eat healthy snacks in between meals. he knows not to eat with his Sinemet dose and we mutually agreed that he will have his lunch around 1 PM and his Sinemet dose around 12:30 PM. He continues to take it 6 times daily. His wife noticed perhaps a worsening of his dyskinesias since the isolate was started a few months ago. I recommended that they bring this up with John Herring next month to see if he can take him off of it for a while. They can always restart it if the need arises. I recommended that he continue with his current medication regimen,   change positions slowly, drink plenty of fluid and perhaps even add a little bit of extra salt to his diet. They are encouraged to monitor his blood pressure regularly.  I answered all their questions today and the patient and his wife  were in agreement. I would like to see  him back in 3 months, sooner if the need arises and encouraged them to call with any interim questions,  concerns, problems or updates and refill requests.

## 2012-07-30 ENCOUNTER — Other Ambulatory Visit: Payer: Self-pay | Admitting: Internal Medicine

## 2012-08-26 ENCOUNTER — Other Ambulatory Visit: Payer: Self-pay | Admitting: Neurosurgery

## 2012-08-26 ENCOUNTER — Encounter (HOSPITAL_COMMUNITY): Payer: Self-pay | Admitting: Pharmacy Technician

## 2012-08-28 ENCOUNTER — Other Ambulatory Visit: Payer: Self-pay | Admitting: Internal Medicine

## 2012-08-31 ENCOUNTER — Encounter (HOSPITAL_COMMUNITY)
Admission: RE | Admit: 2012-08-31 | Discharge: 2012-08-31 | Disposition: A | Discharge status: Home / Self Care | Payer: BC Managed Care – PPO | Source: Ambulatory Visit | Attending: Neurosurgery | Admitting: Neurosurgery

## 2012-08-31 ENCOUNTER — Encounter (HOSPITAL_COMMUNITY): Payer: Self-pay

## 2012-08-31 LAB — CBC
MCH: 31.4 pg (ref 26.0–34.0)
MCV: 89.8 fL (ref 78.0–100.0)
Platelets: 190 10*3/uL (ref 150–400)
RBC: 4.42 MIL/uL (ref 4.22–5.81)
RDW: 14.4 % (ref 11.5–15.5)
WBC: 7.2 10*3/uL (ref 4.0–10.5)

## 2012-08-31 LAB — BASIC METABOLIC PANEL
CO2: 24 mEq/L (ref 19–32)
Calcium: 9.5 mg/dL (ref 8.4–10.5)
Creatinine, Ser: 1.26 mg/dL (ref 0.50–1.35)
GFR calc non Af Amer: 60 mL/min — ABNORMAL LOW (ref >90)
Glucose, Bld: 108 mg/dL — ABNORMAL HIGH (ref 70–99)
Sodium: 139 mEq/L (ref 135–145)

## 2012-08-31 LAB — SURGICAL PCR SCREEN: Staphylococcus aureus: POSITIVE — AB

## 2012-08-31 MED ORDER — CEFAZOLIN SODIUM-DEXTROSE 2-3 GM-% IV SOLR
2.0000 g | INTRAVENOUS | Status: AC
  Administered 2012-09-01: 2 g via INTRAVENOUS
  Filled 2012-08-31: qty 50

## 2012-08-31 NOTE — H&P (Signed)
John Herring  #[id number]  DOB:  [birth year] 02/24/2011:                   John Herring returns today.  He is frustrated with his interaction with Dr. Rubin Payor.  He says he saw him in May.  He adjusted his DBS and then has only been seeing him back every few months and feels he is not doing well since then.  He has not changed any of his medications.  He is not sure how that relationship is to go between him and Dr. Sandria Manly and me.  I wrote a text to Dr. Rubin Payor asking him to see him back sooner to try to optimize things and I will see him back and then we will go from there, as he is currently not scheduled to see him until January and he is extremely dyskinetic on examination today.                                                                            Danae Orleans. Venetia Maxon, M.D./[facility]   Amaryllis Dyke. 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 Orlin Hilding for consideration of deep brain stimulation for Parkinson's disease.  He travels extensively to [place], [place] and [place].  He has been able to work but is having a harder time doing his job and has had a lot of problems with dyskinesias particularly more recently.  He notes pain into his right shoulder and right arm to his elbow and also his neck currently 6-7/10 in severity with 9/10 at night.  He notes numbness into his lower legs and he says he was diagnosed with Parkinson's in 2004.  He is currently taking Requip 4 mg. 5 times daily and Stalevo 150 mg. 4 times daily, and Zelapar 1.25 mg. daily.  He has had lots of problems with dyskinesias but only occasional problems with freezing and he is having symptoms more consistent with on and the dyskinesias than with off.  He says he gets quite tired  with the dyskinesias.  He notes he has trouble with slurring his words and sometimes has difficulty with communication and notes these symptoms over the last six months.  Initially his problem was more tremor than dyskinesias but he does note that he has a lot of difficulty doing fine motor activities for thinks such as buttoning buttons or wiping and he has a hard time with writing clearly or doing fine tasks.    REVIEW OF SYSTEMS:  A detailed Review of Systems sheet was reviewed with the patient.  Pertinent positives are ringing in the ears, balance disturbance, inability to smell, shortness of breath, back pain, arm pain, joint pain, neck pain, difficulty with speech, difficulty with coordination in arms and/or legs.  All other systems are negative; this includes Constitutional symptoms, nose, mouth, throat, Cardiovascular, Endocrine, Gastrointestinal, Genitourinary, Integumentary, Psychiatric, Hematologic/Lymphatic, Allergic/Immunologic.    PAST MEDICAL HISTORY:                                  Current Medical Conditions:                    No other significant medical problems.             Prior Operations and Hospitalizations:     None.            Medications and Allergies:                       He is currently taking Requip 4 mg. 5 times daily and Stalevo 150 mg. 4 times daily, and Zelapar 1.25 mg. daily.  No known drug allergies.            Height and Weight:                                  He lost 28 pounds last year.  Currently 5 feet 11 inches tall, 187 pounds.        SOCIAL HISTORY:                                     Nonsmoker, social drinker of alcoholic beverages.                             PHYSICAL EXAMINATION:                       General Appearance:                           On examination today, Mr. Flynt is a pleasant and cooperative man in no acute distress.             Blood Pressure, Pulse:                  118/70.  Heart rate is 68 and  regular.            HEENT - normocephalic, atraumatic.  The pupils are equal, round and reactive to light.  The extraocular muscles are intact.  Sclerae - white.  Conjunctiva - pink.  Oropharynx benign.  Uvula midline.            Neck - he turns his head to the right and has dyskinesias in which he rotates to the right.  He says that when doing so he gets right arm pain.  There are no masses, meningismus, deformities, tracheal deviation, jugular vein distention or carotid bruits.  There is normal cervical range of motion.  Spurlings' test is negative without reproducible radicular pain turning the patient's head to either side.  Lhermitte's sign is not present with axial compression.  Respiratory - there is normal respiratory effort with good intercostal function.  Lungs are clear to auscultation.  There are no rales, rhonchi or wheezes.             Cardiovascular - the heart has regular rate and rhythm to auscultation.  No murmurs are appreciated.  There is no extremity edema, cyanosis or clubbing.  There are palpable pedal pulses.             Abdomen - soft, nontender, no hepatosplenomegaly appreciated or masses.  There are active bowel sounds.  No guarding or rebound.             Musculoskeletal Examination - the patient is able to walk about the examining room with a normal, casual, heel and toe gait.    NEUROLOGICAL EXAMINATION:        The patient is oriented to time, person and place and has good recall of both recent and remote memory with normal attention span and concentration.  The patient speaks with clear and fluent speech and exhibits normal language function and appropriate fund of knowledge.  Significant dyskinesias in both his neck and his arms.  He does not appear to have a tremor.  He does not appear to have significant rigidity on my examination today.             Cranial Nerve Examination - pupils are equal, round and reactive to light.  Extraocular movements are  full.  Visual fields are full to confrontational testing.  Facial sensation and facial movement are symmetric and intact.  Hearing is intact to finger rub.  Palate is upgoing.  Shoulder shrug is symmetric.  Tongue protrudes in the midline.             Motor Examination - motor strength is 5/5 in the bilateral deltoids, biceps, triceps, handgrips, wrist extensors, interosseous.  In the lower extremities motor strength is 5/5 in hip flexion, extension, quadriceps, hamstrings, plantar flexion, dorsiflexion and extensor hallucis longus.   No cogwheel rigidity or significant tremor but the patient has significant dyskinesias.            Sensory Examination - normal to light touch and pinprick sensation in the upper and lower extremities.            Deep Tendon Reflexes - 2 in the biceps, triceps, and brachioradialis, 2 in the knees, 2 in the ankles.  The great toes are downgoing to plantar stimulation.  No pathologic reflexes. He denies numbness in his upper or lower extremities.              Cerebellar Examination - normal coordination in upper and lower extremities and normal rapid alternating movements.  Romberg test is negative.  Has dyskinetic movements.    IMPRESSION AND RECOMMENDATIONS:      Mr. Helfman is a 62 year old man with a diagnosis of Parkinson's for the last five years.  He has been treated with Requip, Stalevo and Zelapar.  He is having many more problems with dyskinesias over the last six months.  He is sent for consideration of deep brain stimulation and I think this is a very reasonable option for him.  I also think it may help him with control of the dyskinesias but I would like to discuss with Dr. Orlin Hilding consideration of backing down on some of his medications particularly the Zelapar to see if this will also help with his dyskinesias.  I will also assess his cervical spine as I believe that  he may have some disc pathology as a basis for his neck and right arm pain.    I will make  further recommendations after those imaging studies have been performed and I have discussed this situation with Dr. Orlin Hilding.   [facility]       Danae Orleans. Venetia Maxon, M.D.

## 2012-08-31 NOTE — Pre-Procedure Instructions (Signed)
John Herring  08/31/2012   Your procedure is scheduled on:  May 14  Report to [facility] Enter hospital off of [address] then [facility] 3rd Floor at 06:30 AM.  Call this number if you have problems the morning of surgery: [phone]   Remember:   Do not eat food or drink liquids after midnight.   Take these medicines the morning of surgery with A SIP OF WATER: Amantadine, Azilect, Carbidopa- Levodopa, Lexapro, Ritalin, Midorine   Do not wear jewelry, make-up or nail polish.  Do not wear lotions, powders, or perfumes. You may wear deodorant.  Do not shave 48 hours prior to surgery. Men may shave face and neck.  Do not bring valuables to the hospital.  Contacts, dentures or bridgework may not be worn into surgery.  Leave suitcase in the car. After surgery it may be brought to your room.  For patients admitted to the hospital, checkout time is 11:00 AM the day of discharge.   Patients discharged the day of surgery will not be allowed to drive home.  Name and phone number of your driver: Family/ Friend  Special Instructions: Shower using CHG 2 nights before surgery and the night before surgery.  If you shower the day of surgery use CHG.  Use special wash - you have one bottle of CHG for all showers.  You should use approximately 1/3 of the bottle for each shower.   Please read over the following fact sheets that you were given: Pain Booklet, Coughing and Deep Breathing and Surgical Site Infection Prevention

## 2012-09-01 ENCOUNTER — Encounter (HOSPITAL_COMMUNITY): Payer: Self-pay | Admitting: *Deleted

## 2012-09-01 ENCOUNTER — Ambulatory Visit (HOSPITAL_COMMUNITY): Payer: BC Managed Care – PPO | Admitting: Anesthesiology

## 2012-09-01 ENCOUNTER — Ambulatory Visit (HOSPITAL_COMMUNITY)
Admission: RE | Admit: 2012-09-01 | Discharge: 2012-09-01 | Disposition: A | Discharge status: Home / Self Care | Payer: BC Managed Care – PPO | Source: Ambulatory Visit | Attending: Neurosurgery | Admitting: Neurosurgery

## 2012-09-01 ENCOUNTER — Encounter (HOSPITAL_COMMUNITY): Payer: Self-pay | Admitting: Anesthesiology

## 2012-09-01 ENCOUNTER — Encounter (HOSPITAL_COMMUNITY)
Admission: RE | Disposition: A | Discharge status: Home / Self Care | Payer: Self-pay | Source: Ambulatory Visit | Attending: Neurosurgery

## 2012-09-01 DIAGNOSIS — Z462 Encounter for fitting and adjustment of other devices related to nervous system and special senses: Secondary | ICD-10-CM | POA: Insufficient documentation

## 2012-09-01 HISTORY — PX: SUBTHALAMIC STIMULATOR BATTERY REPLACEMENT: SHX5405

## 2012-09-01 SURGERY — SUBTHALAMIC STIMULATOR BATTERY REPLACEMENT
Anesthesia: General | Site: Chest | Wound class: Clean

## 2012-09-01 MED ORDER — VANCOMYCIN HCL 1000 MG IV SOLR
INTRAVENOUS | Status: AC
  Filled 2012-09-01: qty 1000

## 2012-09-01 MED ORDER — MEPERIDINE HCL 25 MG/ML IJ SOLN: 6.2500 mg | INTRAMUSCULAR | Status: DC | PRN

## 2012-09-01 MED ORDER — MUPIROCIN 2 % EX OINT
TOPICAL_OINTMENT | CUTANEOUS | Status: AC
  Administered 2012-09-01: 08:00:00
  Filled 2012-09-01: qty 22

## 2012-09-01 MED ORDER — PROPOFOL 10 MG/ML IV BOLUS
INTRAVENOUS | Status: DC | PRN
  Administered 2012-09-01: 150 mg via INTRAVENOUS

## 2012-09-01 MED ORDER — 0.9 % SODIUM CHLORIDE (POUR BTL) OPTIME
TOPICAL | Status: DC | PRN
  Administered 2012-09-01: 1000 mL

## 2012-09-01 MED ORDER — MIDAZOLAM HCL 5 MG/5ML IJ SOLN
INTRAMUSCULAR | Status: DC | PRN
  Administered 2012-09-01 (×2): 1 mg via INTRAVENOUS

## 2012-09-01 MED ORDER — HYDROMORPHONE HCL PF 1 MG/ML IJ SOLN
INTRAMUSCULAR | Status: AC
  Filled 2012-09-01: qty 1

## 2012-09-01 MED ORDER — OXYCODONE HCL 5 MG/5ML PO SOLN: 5.0000 mg | Freq: Once | ORAL | Status: AC | PRN

## 2012-09-01 MED ORDER — HYDROMORPHONE HCL PF 1 MG/ML IJ SOLN
0.2500 mg | INTRAMUSCULAR | Status: DC | PRN
  Administered 2012-09-01 (×2): 0.25 mg via INTRAVENOUS

## 2012-09-01 MED ORDER — BUPIVACAINE HCL (PF) 0.5 % IJ SOLN
INTRAMUSCULAR | Status: DC | PRN
  Administered 2012-09-01: 5 mL

## 2012-09-01 MED ORDER — LIDOCAINE-EPINEPHRINE 1 %-1:100000 IJ SOLN
INTRAMUSCULAR | Status: DC | PRN
  Administered 2012-09-01: 5 mL

## 2012-09-01 MED ORDER — ONDANSETRON HCL 4 MG/2ML IJ SOLN: 4.0000 mg | Freq: Once | INTRAMUSCULAR | Status: DC | PRN

## 2012-09-01 MED ORDER — VANCOMYCIN HCL 1000 MG IV SOLR
INTRAVENOUS | Status: DC | PRN
  Administered 2012-09-01: 1000 mg

## 2012-09-01 MED ORDER — OXYCODONE HCL 5 MG PO TABS
ORAL_TABLET | ORAL | Status: AC
  Filled 2012-09-01: qty 1

## 2012-09-01 MED ORDER — LACTATED RINGERS IV SOLN
INTRAVENOUS | Status: DC | PRN
  Administered 2012-09-01: 08:00:00 via INTRAVENOUS

## 2012-09-01 MED ORDER — FENTANYL CITRATE 0.05 MG/ML IJ SOLN
INTRAMUSCULAR | Status: DC | PRN
  Administered 2012-09-01: 100 ug via INTRAVENOUS
  Administered 2012-09-01: 50 ug via INTRAVENOUS

## 2012-09-01 MED ORDER — OXYCODONE HCL 5 MG PO TABS
5.0000 mg | ORAL_TABLET | Freq: Once | ORAL | Status: AC | PRN
  Administered 2012-09-01: 5 mg via ORAL

## 2012-09-01 SURGICAL SUPPLY — 58 items
ADAPTER POCKET 2X4 ×1 IMPLANT
ADPR NRSTM 2X4 SPNL CORD ×1 IMPLANT
APL SKNCLS STERI-STRIP NONHPOA (GAUZE/BANDAGES/DRESSINGS) ×1
BAG DECANTER FOR FLEXI CONT (MISCELLANEOUS) ×2 IMPLANT
BENZOIN TINCTURE PRP APPL 2/3 (GAUZE/BANDAGES/DRESSINGS) ×1 IMPLANT
BLADE SURG 10 STRL SS (BLADE) ×2 IMPLANT
BLADE SURG 15 STRL LF DISP TIS (BLADE) ×1 IMPLANT
BLADE SURG 15 STRL SS (BLADE) ×2
BOOT SUTURE AID YELLOW STND (SUTURE) ×2 IMPLANT
CANISTER SUCTION 2500CC (MISCELLANEOUS) ×2 IMPLANT
CLOTH BEACON ORANGE TIMEOUT ST (SAFETY) ×2 IMPLANT
CORDS BIPOLAR (ELECTRODE) ×2 IMPLANT
DRAPE INCISE IOBAN 66X45 STRL (DRAPES) ×2 IMPLANT
DRAPE LAPAROTOMY 100X72 PEDS (DRAPES) ×2 IMPLANT
DRAPE POUCH INSTRU U-SHP 10X18 (DRAPES) ×2 IMPLANT
DRESSING TELFA 8X3 (GAUZE/BANDAGES/DRESSINGS) ×2 IMPLANT
DRSG OPSITE 4X5.5 SM (GAUZE/BANDAGES/DRESSINGS) ×1 IMPLANT
ELECT CAUTERY BLADE 6.4 (BLADE) ×2 IMPLANT
GAUZE SPONGE 4X4 16PLY XRAY LF (GAUZE/BANDAGES/DRESSINGS) ×2 IMPLANT
GLOVE BIO SURGEON STRL SZ8 (GLOVE) ×2 IMPLANT
GLOVE BIOGEL PI IND STRL 8 (GLOVE) ×1 IMPLANT
GLOVE BIOGEL PI IND STRL 8.5 (GLOVE) ×1 IMPLANT
GLOVE BIOGEL PI INDICATOR 8 (GLOVE) ×1
GLOVE BIOGEL PI INDICATOR 8.5 (GLOVE) ×1
GLOVE ECLIPSE 7.5 STRL STRAW (GLOVE) ×2 IMPLANT
GLOVE EXAM NITRILE LRG STRL (GLOVE) IMPLANT
GLOVE EXAM NITRILE MD LF STRL (GLOVE) IMPLANT
GLOVE EXAM NITRILE XL STR (GLOVE) IMPLANT
GLOVE EXAM NITRILE XS STR PU (GLOVE) IMPLANT
GOWN BRE IMP SLV AUR LG STRL (GOWN DISPOSABLE) IMPLANT
GOWN BRE IMP SLV AUR XL STRL (GOWN DISPOSABLE) ×2 IMPLANT
GOWN STRL REIN 2XL LVL4 (GOWN DISPOSABLE) ×2 IMPLANT
KIT BASIN OR (CUSTOM PROCEDURE TRAY) ×2 IMPLANT
KIT ROOM TURNOVER OR (KITS) ×2 IMPLANT
NDL HYPO 25X1 1.5 SAFETY (NEEDLE) ×1 IMPLANT
NEEDLE HYPO 25X1 1.5 SAFETY (NEEDLE) ×2 IMPLANT
NEUROSTIM OCTOPOLAR ~~LOC~~ 49X65 (Neuro Prosthesis/Implant) ×1 IMPLANT
NS IRRIG 1000ML POUR BTL (IV SOLUTION) ×2 IMPLANT
PACK SURGICAL SETUP 50X90 (CUSTOM PROCEDURE TRAY) ×2 IMPLANT
PAD ARMBOARD 7.5X6 YLW CONV (MISCELLANEOUS) ×6 IMPLANT
PENCIL BUTTON HOLSTER BLD 10FT (ELECTRODE) ×2 IMPLANT
PROGRAMMER EON PATIENT (MISCELLANEOUS) ×1 IMPLANT
SPONGE GAUZE 4X4 12PLY (GAUZE/BANDAGES/DRESSINGS) ×2 IMPLANT
STAPLER SKIN PROX WIDE 3.9 (STAPLE) ×2 IMPLANT
STOCKINETTE 4X48 STRL (DRAPES) ×2 IMPLANT
STRIP CLOSURE SKIN 1/2X4 (GAUZE/BANDAGES/DRESSINGS) ×2 IMPLANT
SUT ETHILON 3 0 PS 1 (SUTURE) ×4 IMPLANT
SUT SILK 2 0 TIES 10X30 (SUTURE) ×2 IMPLANT
SUT VIC AB 2-0 CP2 18 (SUTURE) ×2 IMPLANT
SUT VIC AB 3-0 SH 8-18 (SUTURE) ×2 IMPLANT
SYR BULB 3OZ (MISCELLANEOUS) ×2 IMPLANT
SYR CONTROL 10ML LL (SYRINGE) ×2 IMPLANT
TAPE STRIPS DRAPE STRL (GAUZE/BANDAGES/DRESSINGS) ×1 IMPLANT
TOWEL OR 17X24 6PK STRL BLUE (TOWEL DISPOSABLE) ×2 IMPLANT
TOWEL OR 17X26 10 PK STRL BLUE (TOWEL DISPOSABLE) ×2 IMPLANT
TUBE CONNECTING 12X1/4 (SUCTIONS) ×2 IMPLANT
UNDERPAD 30X30 INCONTINENT (UNDERPADS AND DIAPERS) ×2 IMPLANT
WATER STERILE IRR 1000ML POUR (IV SOLUTION) ×2 IMPLANT

## 2012-09-01 NOTE — Preoperative (Signed)
Beta Blockers   Reason not to administer Beta Blockers:Not Applicable 

## 2012-09-01 NOTE — Anesthesia Preprocedure Evaluation (Addendum)
Anesthesia Evaluation  Patient identified by MRN, date of birth, ID band Patient awake    Reviewed: Allergy & Precautions, H&P , NPO status , Patient's Chart, lab work & pertinent test results, reviewed documented beta blocker date and time   Airway Mallampati: I TM Distance: >3 FB Neck ROM: Full    Dental  (+) Teeth Intact and Dental Advisory Given   Pulmonary sleep apnea ,  Does not use CPAP         Cardiovascular     Neuro/Psych H/O Parkinsonism    GI/Hepatic   Endo/Other    Renal/GU      Musculoskeletal   Abdominal   Peds  Hematology   Anesthesia Other Findings   Reproductive/Obstetrics                          Anesthesia Physical Anesthesia Plan  ASA: III  Anesthesia Plan: General   Post-op Pain Management:    Induction: Intravenous  Airway Management Planned: Oral ETT  Additional Equipment:   Intra-op Plan:   Post-operative Plan: Extubation in OR  Informed Consent: I have reviewed the patients History and Physical, chart, labs and discussed the procedure including the risks, benefits and alternatives for the proposed anesthesia with the patient or authorized representative who has indicated his/her understanding and acceptance.   Dental advisory given  Plan Discussed with: CRNA, Surgeon and Anesthesiologist  Anesthesia Plan Comments:        Anesthesia Quick Evaluation

## 2012-09-01 NOTE — Anesthesia Postprocedure Evaluation (Signed)
Anesthesia Post Note  Patient: John Herring  Procedure(s) Performed: Procedure(s) (LRB): SUBTHALAMIC STIMULATOR BATTERY REPLACEMENT (N/A)  Anesthesia type: general  Patient location: PACU  Post pain: Pain level controlled  Post assessment: Patient's Cardiovascular Status Stable  Last Vitals:  Filed Vitals:   09/01/12 1046  BP: 111/75  Pulse: 82  Temp: 36.8 C  Resp: 16    Post vital signs: Reviewed and stable  Level of consciousness: sedated  Complications: No apparent anesthesia complications

## 2012-09-01 NOTE — Interval H&P Note (Signed)
History and Physical Interval Note:  09/01/2012 8:16 AM  John Herring  has presented today for surgery, with the diagnosis of Parkinsons   The various methods of treatment have been discussed with the patient and family. After consideration of risks, benefits and other options for treatment, the patient has consented to  Procedure(s) with comments: SUBTHALAMIC STIMULATOR BATTERY REPLACEMENT (N/A) - Deep Brain Stimulator battery change as a surgical intervention .  The patient's history has been reviewed, patient examined, no change in status, stable for surgery.  I have reviewed the patient's chart and labs.  Questions were answered to the patient's satisfaction.     [name]

## 2012-09-01 NOTE — Anesthesia Procedure Notes (Signed)
Procedure Name: LMA Insertion Date/Time: 09/01/2012 8:37 AM Performed by: Brien Mates DOBSON Pre-anesthesia Checklist: Patient identified, Emergency Drugs available, Suction available, Patient being monitored and Timeout performed Patient Re-evaluated:Patient Re-evaluated prior to inductionOxygen Delivery Method: Circle system utilized Preoxygenation: Pre-oxygenation with 100% oxygen Intubation Type: IV induction Ventilation: Mask ventilation without difficulty LMA: LMA inserted LMA Size: 4.0 Number of attempts: 1 Placement Confirmation: positive ETCO2 and breath sounds checked- equal and bilateral Tube secured with: Tape Dental Injury: Teeth and Oropharynx as per pre-operative assessment

## 2012-09-01 NOTE — Progress Notes (Signed)
Spoke with Herring Herring , Dr. Rush Farmer nurse re; when dressing can come off.  Herring Herring stated to leave on 24-48 hours, leave steri strips or surgical glue alone will come off in few weeks.  No lotions, powders, etc to be used around incision (patient + spouse made aware).

## 2012-09-01 NOTE — Brief Op Note (Signed)
09/01/2012  9:05 AM  PATIENT:  John Herring  62 y.o. male  PRE-OPERATIVE DIAGNOSIS:  Parkinsons   POST-OPERATIVE DIAGNOSIS:  Parkinsons   PROCEDURE:  Procedure(s) with comments: SUBTHALAMIC STIMULATOR BATTERY REPLACEMENT (N/A) - Deep Brain Stimulator battery change  SURGEON:  Surgeon(s) and Role:    * Maeola Harman, MD - Primary  PHYSICIAN ASSISTANT:   ASSISTANTS: none   ANESTHESIA:   general  EBL:  Total I/O In: 800 [I.V.:800] Out: -   BLOOD ADMINISTERED:none  DRAINS: none   LOCAL MEDICATIONS USED:  MARCAINE     SPECIMEN:  No Specimen  DISPOSITION OF SPECIMEN:  N/A  COUNTS:  YES  TOURNIQUET:  * No tourniquets in log *  DICTATION: Patient has implanted subthalamic stimulator electrodes and IPG, which is now depleted.  It was elected for patient to undergo IPG revision.  PROCEDURE: Patient was brought to the operating room and given intravenous sedation.  Right upper chest was prepped with betadine scrub and duraprep.  Area of planned incision was infiltrated with lidocaine.  Prior incision was reopened and the old IPG was externalized.  Adaptor was connected to new IPG which was placed in the pocket.  Wound was irrigated with bacitracin and irrigated with vancomycin. Then irrigated once more.  Incision was closed with 2-0 Vicryl and 3-0 vicryl sutures and dressed with a sterile occlusive dressing.  Counts were correct at the end of the case.  PLAN OF CARE: Discharge to home after PACU  PATIENT DISPOSITION:  PACU - hemodynamically stable.   Delay start of Pharmacological VTE agent (>24hrs) due to surgical blood loss or risk of bleeding: yes

## 2012-09-01 NOTE — Op Note (Signed)
09/01/2012  9:05 AM  PATIENT:  John Herring  62 y.o. male  PRE-OPERATIVE DIAGNOSIS:  Parkinsons   POST-OPERATIVE DIAGNOSIS:  Parkinsons   PROCEDURE:  Procedure(s) with comments: SUBTHALAMIC STIMULATOR BATTERY REPLACEMENT (N/A) - Deep Brain Stimulator battery change  SURGEON:  Surgeon(s) and Role:    * Vanetta Rule, MD - Primary  PHYSICIAN ASSISTANT:   ASSISTANTS: none   ANESTHESIA:   general  EBL:  Total I/O In: 800 [I.V.:800] Out: -   BLOOD ADMINISTERED:none  DRAINS: none   LOCAL MEDICATIONS USED:  MARCAINE     SPECIMEN:  No Specimen  DISPOSITION OF SPECIMEN:  N/A  COUNTS:  YES  TOURNIQUET:  * No tourniquets in log *  DICTATION: Patient has implanted subthalamic stimulator electrodes and IPG, which is now depleted.  It was elected for patient to undergo IPG revision.  PROCEDURE: Patient was brought to the operating room and given intravenous sedation.  Right upper chest was prepped with betadine scrub and duraprep.  Area of planned incision was infiltrated with lidocaine.  Prior incision was reopened and the old IPG was externalized.  Adaptor was connected to new IPG which was placed in the pocket.  Wound was irrigated with bacitracin and irrigated with vancomycin. Then irrigated once more.  Incision was closed with 2-0 Vicryl and 3-0 vicryl sutures and dressed with a sterile occlusive dressing.  Counts were correct at the end of the case.  PLAN OF CARE: Discharge to home after PACU  PATIENT DISPOSITION:  PACU - hemodynamically stable.   Delay start of Pharmacological VTE agent (>24hrs) due to surgical blood loss or risk of bleeding: yes  

## 2012-09-01 NOTE — Transfer of Care (Signed)
Immediate Anesthesia Transfer of Care Note  Patient: John Herring  Procedure(s) Performed: Procedure(s) with comments: SUBTHALAMIC STIMULATOR BATTERY REPLACEMENT (N/A) - Deep Brain Stimulator battery change  Patient Location: PACU  Anesthesia Type:General  Level of Consciousness: awake  Airway & Oxygen Therapy: Patient Spontanous Breathing and Patient connected to face mask oxygen  Post-op Assessment: Report given to PACU RN and Post -op Vital signs reviewed and stable  Post vital signs: Reviewed and stable  Complications: No apparent anesthesia complications

## 2012-09-03 ENCOUNTER — Encounter (HOSPITAL_COMMUNITY): Payer: Self-pay | Admitting: Neurosurgery

## 2012-09-05 ENCOUNTER — Other Ambulatory Visit: Payer: Self-pay | Admitting: Internal Medicine

## 2012-09-06 NOTE — Telephone Encounter (Signed)
mobic done erx 

## 2012-10-07 ENCOUNTER — Ambulatory Visit: Payer: Medicare Other | Admitting: Neurology

## 2012-11-08 ENCOUNTER — Ambulatory Visit: Payer: BC Managed Care – PPO | Admitting: Neurology

## 2012-12-01 ENCOUNTER — Other Ambulatory Visit: Payer: Self-pay | Admitting: Internal Medicine

## 2012-12-02 ENCOUNTER — Ambulatory Visit (INDEPENDENT_AMBULATORY_CARE_PROVIDER_SITE_OTHER): Payer: BC Managed Care – PPO | Admitting: Internal Medicine

## 2012-12-02 ENCOUNTER — Other Ambulatory Visit (INDEPENDENT_AMBULATORY_CARE_PROVIDER_SITE_OTHER): Payer: BC Managed Care – PPO

## 2012-12-02 ENCOUNTER — Encounter: Payer: Self-pay | Admitting: Internal Medicine

## 2012-12-02 ENCOUNTER — Telehealth: Payer: Self-pay | Admitting: *Deleted

## 2012-12-02 DIAGNOSIS — Z0000 Encounter for general adult medical examination without abnormal findings: Secondary | ICD-10-CM

## 2012-12-02 DIAGNOSIS — K219 Gastro-esophageal reflux disease without esophagitis: Secondary | ICD-10-CM

## 2012-12-02 HISTORY — DX: Gastro-esophageal reflux disease without esophagitis: K21.9

## 2012-12-02 LAB — CBC WITH DIFFERENTIAL/PLATELET
Basophils Absolute: 0.1 10*3/uL (ref 0.0–0.1)
Eosinophils Absolute: 0.1 10*3/uL (ref 0.0–0.7)
HCT: 42.7 % (ref 39.0–52.0)
Hemoglobin: 14.2 g/dL (ref 13.0–17.0)
Lymphs Abs: 2.9 10*3/uL (ref 0.7–4.0)
MCHC: 33.3 g/dL (ref 30.0–36.0)
MCV: 92.4 fl (ref 78.0–100.0)
Monocytes Relative: 9.3 % (ref 3.0–12.0)
Neutro Abs: 5 10*3/uL (ref 1.4–7.7)
RDW: 14.5 % (ref 11.5–14.6)

## 2012-12-02 LAB — HEPATIC FUNCTION PANEL
Alkaline Phosphatase: 89 U/L (ref 39–117)
Bilirubin, Direct: 0.2 mg/dL (ref 0.0–0.3)
Total Bilirubin: 1.1 mg/dL (ref 0.3–1.2)

## 2012-12-02 LAB — LIPID PANEL
HDL: 43.5 mg/dL (ref >39.00)
Total CHOL/HDL Ratio: 4
VLDL: 35.2 mg/dL (ref 0.0–40.0)

## 2012-12-02 LAB — BASIC METABOLIC PANEL
Calcium: 10.3 mg/dL (ref 8.4–10.5)
GFR: 53.77 mL/min — ABNORMAL LOW (ref >60.00)
Sodium: 138 mEq/L (ref 135–145)

## 2012-12-02 MED ORDER — ESCITALOPRAM OXALATE 10 MG PO TABS: 10.0000 mg | ORAL_TABLET | Freq: Every day | ORAL | Status: DC

## 2012-12-02 MED ORDER — ATORVASTATIN CALCIUM 20 MG PO TABS: 20.0000 mg | ORAL_TABLET | Freq: Every day | ORAL | Status: DC

## 2012-12-02 NOTE — Telephone Encounter (Signed)
Can address at OV today

## 2012-12-02 NOTE — Patient Instructions (Signed)
Please continue all other medications as before, and refills have been done if requested. Please continue your efforts at being more active, low cholesterol diet, and weight control. You are otherwise up to date with prevention measures today. Please keep your appointments with your specialists as you have planned Please go to the LAB in the Basement (turn left off the elevator) for the tests to be done today You will be contacted by phone if any changes need to be made immediately.  Otherwise, you will receive a letter about your results with an explanation, but please check with MyChart first.  Please remember to sign up for My Chart if you have not done so, as this will be important to you in the future with finding out test results, communicating by private email, and scheduling acute appointments online when needed.  Please return in 1 year for your yearly visit, or sooner if needed, with Lab testing done 3-5 days before  

## 2012-12-02 NOTE — Assessment & Plan Note (Signed)

## 2012-12-02 NOTE — Telephone Encounter (Signed)
Pt called requesting refills on Escitalopram and Atorvistatin.  Pt scheduled appointment for this afternoon,  Please advise

## 2012-12-02 NOTE — Progress Notes (Signed)
Subjective:    Patient ID: John Herring, male    DOB: Mar 02, 1951, 62 y.o.   MRN: [id number]  HPI  Here for wellness and f/u;  Overall doing ok;  Pt denies CP, worsening SOB, DOE, wheezing, orthopnea, PND, worsening LE edema, palpitations, dizziness or syncope.  Pt denies neurological change such as new headache, facial or extremity weakness.  Pt denies polydipsia, polyuria, or low sugar symptoms. Pt states overall good compliance with treatment and medications, good tolerability, and has been trying to follow lower cholesterol diet.  Pt denies worsening depressive symptoms, suicidal ideation or panic. No fever, night sweats, wt loss, loss of appetite, or other constitutional symptoms.  Pt states good ability with ADL's, has low fall risk, home safety reviewed and adequate, no other significant changes in hearing or vision, and only occasionally active with exercise. No acute compalints, Sees Dr Donneta Romberg Azzie Almas at [facility], also NS - Dr Venetia Maxon locally. Has occasional reflux controlled with TUMS Past Medical History  Diagnosis Date  . 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 Hypersomnia 05/06/2011  . Hyperlipidemia 05/06/2011  . Skin cancer 05/06/2011  . Depression 05/06/2011  . Memory loss 07/12/2012   Past Surgical History  Procedure Laterality Date  . Deep brain stimulation      Parkinson's disease  . Tonsillectomy      As a child  . Subthalamic stimulator battery replacement N/A 09/01/2012    Procedure: SUBTHALAMIC STIMULATOR BATTERY REPLACEMENT;  Surgeon: Maeola Harman, MD;  Location: [facility];  Service: Neurosurgery;  Laterality: N/A;  Deep Brain Stimulator battery change    reports that he has never smoked. He has never used smokeless tobacco. He reports that he does not drink alcohol or use illicit drugs. family history is negative for Colon cancer. No Known Allergies Current Outpatient Prescriptions on File Prior to Visit  Medication Sig Dispense Refill   . amantadine (SYMMETREL) 100 MG capsule Take 100 mg by mouth 2 (two) times daily.      . AZILECT 1 MG TABS Take 1 mg by mouth daily.       . carbidopa-levodopa (SINEMET) 25-100 MG per tablet 6 (six) times daily. Take 1/2 twice daily and 1 by mouth daily      . meloxicam (MOBIC) 15 MG tablet Take 15 mg by mouth daily.      . meloxicam (MOBIC) 15 MG tablet TAKE 1 TABLET BY MOUTH EVERY DAY  90 tablet  1  . methylphenidate (RITALIN) 5 MG tablet Take 5 mg by mouth 3 (three) times daily.      . midodrine (PROAMATINE) 5 MG tablet Take 5-7.5 mg by mouth 3 (three) times daily. 7.5 mg at 730 am and then 5 mg 1230 pm and 5 mg at 530p       No current facility-administered medications on file prior to visit.   Review of Systems Constitutional: Negative for diaphoresis, activity change, appetite change or unexpected weight change.  HENT: Negative for hearing loss, ear pain, facial swelling, mouth sores and neck stiffness.   Eyes: Negative for pain, redness and visual disturbance.  Respiratory: Negative for shortness of breath and wheezing.   Cardiovascular: Negative for chest pain and palpitations.  Gastrointestinal: Negative for diarrhea, blood in stool, abdominal distention or other pain Genitourinary: Negative for hematuria, flank pain or change in urine volume.  Musculoskeletal: Negative for myalgias and joint swelling.  Skin: Negative for color change and wound.  Neurological: Negative for syncope and numbness. other than  noted Hematological: Negative for adenopathy.  Psychiatric/Behavioral: Negative for hallucinations, self-injury, decreased concentration and agitation.      Objective:   Physical Exam There were no vitals taken for this visit. VS noted,  Constitutional: Pt is oriented to person, place, and time. Appears well-developed and well-nourished.  Head: Normocephalic and atraumatic.  Right Ear: External ear normal.  Left Ear: External ear normal.  Nose: Nose normal.   Mouth/Throat: Oropharynx is clear and moist.  Eyes: Conjunctivae and EOM are normal. Pupils are equal, round, and reactive to light.  Neck: Normal range of motion. Neck supple. No JVD present. No tracheal deviation present.  Cardiovascular: Normal rate, regular rhythm, normal heart sounds and intact distal pulses.   Pulmonary/Chest: Effort normal and breath sounds normal.  Abdominal: Soft. Bowel sounds are normal. There is no tenderness. No HSM  Musculoskeletal: Normal range of motion. Exhibits no edema.  Lymphadenopathy:  Has no cervical adenopathy.  Neurological: Pt is alert and oriented to person, place, and time. Pt has normal reflexes. No cranial nerve deficit. Motor 5/5, large abnormal movements despite current med and DBS Skin: Skin is warm and dry. No rash noted.  Psychiatric:  Has  normal mood and affect. Behavior is normal.     Assessment & Plan:

## 2012-12-03 ENCOUNTER — Encounter: Payer: Self-pay | Admitting: Internal Medicine

## 2012-12-03 ENCOUNTER — Other Ambulatory Visit: Payer: Self-pay | Admitting: Internal Medicine

## 2012-12-03 ENCOUNTER — Other Ambulatory Visit: Payer: Self-pay | Admitting: Neurology

## 2012-12-03 DIAGNOSIS — I951 Orthostatic hypotension: Secondary | ICD-10-CM

## 2012-12-03 LAB — URINALYSIS, ROUTINE W REFLEX MICROSCOPIC
Ketones, ur: NEGATIVE
RBC / HPF: NONE SEEN (ref 0–?)
Specific Gravity, Urine: 1.015 (ref 1.000–1.030)
Urine Glucose: NEGATIVE
Urobilinogen, UA: 0.2 (ref 0.0–1.0)

## 2012-12-03 MED ORDER — MIDODRINE HCL 5 MG PO TABS: ORAL_TABLET | ORAL | Status: DC

## 2012-12-03 NOTE — Telephone Encounter (Signed)
MD will address at OV

## 2013-01-04 ENCOUNTER — Encounter: Payer: BC Managed Care – PPO | Admitting: Internal Medicine

## 2013-03-10 ENCOUNTER — Other Ambulatory Visit: Payer: Self-pay | Admitting: Internal Medicine

## 2013-03-16 DIAGNOSIS — G4752 REM sleep behavior disorder: Secondary | ICD-10-CM | POA: Insufficient documentation

## 2013-03-16 DIAGNOSIS — G249 Dystonia, unspecified: Secondary | ICD-10-CM | POA: Insufficient documentation

## 2013-03-16 DIAGNOSIS — R55 Syncope and collapse: Secondary | ICD-10-CM | POA: Insufficient documentation

## 2013-03-16 DIAGNOSIS — R269 Unspecified abnormalities of gait and mobility: Secondary | ICD-10-CM | POA: Insufficient documentation

## 2013-03-23 ENCOUNTER — Ambulatory Visit (INDEPENDENT_AMBULATORY_CARE_PROVIDER_SITE_OTHER): Payer: BC Managed Care – PPO | Admitting: Internal Medicine

## 2013-03-23 ENCOUNTER — Encounter: Payer: Self-pay | Admitting: Internal Medicine

## 2013-03-23 DIAGNOSIS — I951 Orthostatic hypotension: Secondary | ICD-10-CM

## 2013-03-23 DIAGNOSIS — F329 Major depressive disorder, single episode, unspecified: Secondary | ICD-10-CM

## 2013-03-23 DIAGNOSIS — F3289 Other specified depressive episodes: Secondary | ICD-10-CM

## 2013-03-23 DIAGNOSIS — E785 Hyperlipidemia, unspecified: Secondary | ICD-10-CM

## 2013-03-23 NOTE — Assessment & Plan Note (Signed)
With myalgias ? Related to statin, for trial off lipitor,  to f/u any worsening symptoms or concerns

## 2013-03-23 NOTE — Patient Instructions (Signed)
Please take HALF of the lexapro for 1 mo, then stop completely OK to stop the Lipitor now to see if the pains seem to resolve, or weakness improves so that you have less chance of falls Please keep your appointments with your specialists as you have planned - Neurology  Please remember to sign up for My Chart if you have not done so, as this will be important to you in the future with finding out test results, communicating by private email, and scheduling acute appointments online when needed.

## 2013-03-23 NOTE — Assessment & Plan Note (Signed)
To cont current meds, avoid further anti-htn meds as not likely to tolerate

## 2013-03-23 NOTE — Progress Notes (Signed)
   Subjective:    Patient ID: John Herring, male    DOB: 1950-11-04, 62 y.o.   MRN: [id number]  HPI  Here to fu, has worsening recurring orthostasis, dizzy with standing now on mult meds, and fell last night with abrasion to left forehead, no other injury.  Pt states neurology asked him to see about cutting back on other meds if possible, does have myalgias ongoing - ? Statin realted.  Also denies worsening depression, on lexapro x 2 yrs, asks for trial off.  mobic helping for arthritic pain. Past Medical History  Diagnosis Date  . 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 Hypersomnia 05/06/2011  . Hyperlipidemia 05/06/2011  . Skin cancer 05/06/2011  . Depression 05/06/2011  . Memory loss 07/12/2012  . GERD (gastroesophageal reflux disease) 12/02/2012   Past Surgical History  Procedure Laterality Date  . Deep brain stimulation      Parkinson's disease  . Tonsillectomy      As a child  . Subthalamic stimulator battery replacement N/A 09/01/2012    Procedure: SUBTHALAMIC STIMULATOR BATTERY REPLACEMENT;  Surgeon: Maeola Harman, MD;  Location: [facility];  Service: Neurosurgery;  Laterality: N/A;  Deep Brain Stimulator battery change    reports that he has never smoked. He has never used smokeless tobacco. He reports that he does not drink alcohol or use illicit drugs. family history is negative for Colon cancer. No Known Allergies Current Outpatient Prescriptions on File Prior to Visit  Medication Sig Dispense Refill  . amantadine (SYMMETREL) 100 MG capsule Take 100 mg by mouth 2 (two) times daily.      . AZILECT 1 MG TABS Take 1 mg by mouth daily.       . carbidopa-levodopa (SINEMET) 25-100 MG per tablet 6 (six) times daily. Take 1/2 twice daily and 1 by mouth daily      . meloxicam (MOBIC) 15 MG tablet Take 15 mg by mouth daily.      . meloxicam (MOBIC) 15 MG tablet TAKE 1 TABLET BY MOUTH EVERY DAY  90 tablet  1  . methylphenidate (RITALIN) 5 MG tablet  Take 5 mg by mouth 3 (three) times daily.      . midodrine (PROAMATINE) 5 MG tablet 7.5 mg at 730 am and then 5 mg 1230 pm and 5 mg at 530p  45 tablet  5   No current facility-administered medications on file prior to visit.      Review of Systems  Constitutional: Negative for unexpected weight change, or unusual diaphoresis  HENT: Negative for tinnitus.   Eyes: Negative for photophobia and visual disturbance.  Respiratory: Negative for choking and stridor.   Gastrointestinal: Negative for vomiting and blood in stool.  Genitourinary: Negative for hematuria and decreased urine volume.  Musculoskeletal: Negative for acute joint swelling Skin: Negative for color change and wound.  Psychiatric/Behavioral: Negative for decreased concentration or  hyperactivity.       Objective:   Physical Exam        Assessment & Plan:

## 2013-03-23 NOTE — Assessment & Plan Note (Signed)
Ok for trial off lexapro at 5 mg per day for one month, then stop

## 2013-03-23 NOTE — Progress Notes (Signed)
Pre-visit discussion using our clinic review tool. No additional management support is needed unless otherwise documented below in the visit note.  

## 2013-05-04 ENCOUNTER — Other Ambulatory Visit: Payer: Self-pay | Admitting: *Deleted

## 2013-05-04 DIAGNOSIS — I951 Orthostatic hypotension: Secondary | ICD-10-CM

## 2013-05-04 MED ORDER — MIDODRINE HCL 5 MG PO TABS: ORAL_TABLET | ORAL | Status: DC

## 2013-05-04 NOTE — Telephone Encounter (Signed)
Patient phoned needing refill on midodrine.  Last OV with CP 03/23/2013.  Refilled per protocol.

## 2013-05-10 ENCOUNTER — Telehealth: Payer: Self-pay

## 2013-05-10 DIAGNOSIS — I951 Orthostatic hypotension: Secondary | ICD-10-CM

## 2013-05-10 MED ORDER — MIDODRINE HCL 5 MG PO TABS
ORAL_TABLET | ORAL | Status: DC
Start: 2013-05-10 — End: 2013-06-06

## 2013-05-10 NOTE — Telephone Encounter (Signed)
Pharmacy requesting ok to fill Midorine 5 mg for a 30 day supply.  Advise please.

## 2013-05-10 NOTE — Telephone Encounter (Signed)
Ok

## 2013-06-06 ENCOUNTER — Other Ambulatory Visit: Payer: Self-pay | Admitting: Internal Medicine

## 2013-06-21 ENCOUNTER — Other Ambulatory Visit: Payer: Self-pay | Admitting: Internal Medicine

## 2013-06-22 NOTE — Telephone Encounter (Signed)
Done erx 

## 2013-06-30 ENCOUNTER — Other Ambulatory Visit: Payer: Self-pay | Admitting: Dermatology

## 2013-09-08 ENCOUNTER — Other Ambulatory Visit: Payer: Self-pay | Admitting: Internal Medicine

## 2013-10-04 ENCOUNTER — Other Ambulatory Visit: Payer: Self-pay | Admitting: Internal Medicine

## 2013-10-06 ENCOUNTER — Telehealth: Payer: Self-pay

## 2013-10-06 NOTE — Telephone Encounter (Signed)
Ok this time only 

## 2013-10-06 NOTE — Telephone Encounter (Signed)
Pharmacy called needing ok to increase recent refill from #45 to #90 for midodrine as going to [place] for one month.  Informed pharmacist ok to increase as were refills on medication.

## 2013-10-07 NOTE — Telephone Encounter (Signed)
Pharmacy informed.

## 2013-11-22 ENCOUNTER — Emergency Department
Admission: EM | Admit: 2013-11-22 | Discharge: 2013-11-22 | Disposition: A | Discharge status: Home / Self Care | Payer: BC Managed Care – PPO | Attending: Emergency Medicine | Admitting: Emergency Medicine

## 2013-11-22 ENCOUNTER — Emergency Department: Payer: Medicare Other

## 2013-11-22 ENCOUNTER — Emergency Department: Payer: BC Managed Care – PPO

## 2013-11-22 DIAGNOSIS — S20212A Contusion of left front wall of thorax, initial encounter: Secondary | ICD-10-CM

## 2013-11-22 DIAGNOSIS — S20219A Contusion of unspecified front wall of thorax, initial encounter: Secondary | ICD-10-CM | POA: Insufficient documentation

## 2013-11-22 DIAGNOSIS — W19XXXA Unspecified fall, initial encounter: Secondary | ICD-10-CM | POA: Insufficient documentation

## 2013-11-22 DIAGNOSIS — I10 Essential (primary) hypertension: Secondary | ICD-10-CM | POA: Insufficient documentation

## 2013-11-22 MED ORDER — OXYCODONE-ACETAMINOPHEN 5-325 MG PO TABS
1.0000 | ORAL_TABLET | ORAL | Status: DC | PRN
Start: 2013-11-22 — End: 2017-08-06

## 2013-11-22 NOTE — ED Provider Notes (Signed)
Physician/Midlevel provider first contact with patient: 11/22/13 1419         History     Chief Complaint   Patient presents with   . Fall   . Hip Pain     Patient is a 63 y.o. male presenting with fall and hip pain. The history is provided by the patient and the spouse. No language interpreter was used.   Fall  Incident onset: one hour ago. The fall occurred while walking. Distance fallen: the patient lost his balance when walking down the steps. He was turning on the landing and fell down the remainder of the steps. He impacted his left rib cage. The point of impact was the head and left hip. He was ambulatory at the scene. There was no entrapment after the fall. There was no drug use involved in the accident. Pertinent negatives include no abdominal pain, no vomiting, no headaches and no loss of consciousness.   Hip Pain  Pertinent negatives include no abdominal pain, headaches or vomiting.   he states that he hit his left hip but that has not bothering him. He also hit his head but he said not enough to cause any injury and he denies any headache, neck pain, loss of consciousness or any focal motor or sensory complaints. hhe denies any injury to his extremities and has no abdominal pain.    @INJURYMECHANISM @   Past Medical History   Diagnosis Date   . Parkinson disease    . Hypertension        History reviewed. No pertinent past surgical history.    History reviewed. No pertinent family history.    Social  History   Substance Use Topics   . Smoking status: Never Smoker    . Smokeless tobacco: Not on file   . Alcohol Use: No       .     No Known Allergies    Current/Home Medications    CARBIDOPA-LEVODOPA (SINEMET) 10-100 MG PER TABLET    Take 1 tablet by mouth 3 (three) times daily.        Review of Systems   Gastrointestinal: Negative for vomiting and abdominal pain.   Neurological: Negative for loss of consciousness and headaches.   All other systems reviewed and are negative.      Physical Exam    BP: 140/88  mmHg, Heart Rate: 102, Temp: 97.5 F (36.4 C), Resp Rate: 20, SpO2: 97 %    Physical Exam   Constitutional: He appears well-developed and well-nourished.   HENT:   Head: Normocephalic and atraumatic.   Eyes: EOM are normal. Pupils are equal, round, and reactive to light.   Neck: Neck supple.   Neck is nontender   Cardiovascular: Normal rate, regular rhythm and normal heart sounds.    Pulmonary/Chest: Effort normal and breath sounds normal.   There is moderate to marked tenderness over the left thorax in the midaxillary line down to approximately rib 10.   Abdominal: Soft. There is no tenderness.       MDM and ED Course     ED Medication Orders    None        Radiology Results (24 Hour)    Procedure Component Value Units Date/Time    XR Ribs Left W PA Chest [[id number]] Collected:  11/22/13 1452    Order Status:  Completed Updated:  11/22/13 1454    Narrative:      Clinical History:  injury    Examination:  Four views  of the left ribs and a frontal view of the chest.    Comparison:  None available.    Findings:  Heart normal size. Lungs clear. No pleural effusion or pneumothorax.  There is no fracture or other focal bony  pathology.  Soft tissues unremarkable.      Impression:      Normal chest and left ribs.    ReadingStation:[id number]             MDM  3:06 PM   Reexamination of the abdomen shows no tenderness. There is no sign of splenic injury on a clinical basis.    Procedures    Clinical Impression & Disposition     Clinical Impression  Final diagnoses:   Rib contusion, left, initial encounter        ED Disposition    Discharge [name] discharge to home/self care.    Condition at disposition: Stable             New Prescriptions    OXYCODONE-ACETAMINOPHEN (PERCOCET) 5-325 MG PER TABLET    Take 1-2 tablets by mouth every 4 (four) hours as needed for Pain.                 Charissa Bash, MD  11/22/13 [phone]

## 2013-11-22 NOTE — Discharge Instructions (Signed)
Rib Contusion    A rib contusion is a bruise to one or more rib bones. It may cause pain, tenderness, swelling and a purplish discoloration. There may be a sharp pain with each breath. A rib contusion takes a few days, to a few weeks to heal.    A small crack (fracture) in the rib may cause the same symptoms as a rib contusion. The small crack may not be seen on a chest x-ray. However, the treatment of these two conditions are the same.  Home Care:   Rest. You should not be doing any heavy lifting or strenuous exertion, or any activity that causes pain.   Apply an ice pack (ice cubes in a plastic bag, wrapped in a towel) over the injured area for 20 minutes every 1-2 hours the first day. Continue with ice packs 3-4 times a day for the next two days, then as needed for the relief of pain and swelling.   You may use acetaminophen (Tylenol) or ibuprofen (Motrin, Advil) to control pain, unless another pain medicine was prescribed. [NOTE: If you have chronic liver or kidney disease or ever had a stomach ulcer or GI bleeding, talk with your doctor before using these medicines.]  Follow Up  with your doctor during the next week or as directed.  Get Prompt Medical Attention  if any of the following occur:   Shortness of breath   Increasing chest pain with breathing or congested cough   Dizziness, weakness or fainting   New or worsening of abdominal pain   Fever of 100.4F (38C) or higher, or as directed by your healthcare provider   2000-2014 [employer] [address], [place] [address]. All rights reserved. This information is not intended as a substitute for professional medical care. Always follow your healthcare professional's instructions.

## 2013-12-06 ENCOUNTER — Other Ambulatory Visit (INDEPENDENT_AMBULATORY_CARE_PROVIDER_SITE_OTHER): Payer: BC Managed Care – PPO

## 2013-12-06 ENCOUNTER — Encounter: Payer: Self-pay | Admitting: Internal Medicine

## 2013-12-06 ENCOUNTER — Ambulatory Visit (INDEPENDENT_AMBULATORY_CARE_PROVIDER_SITE_OTHER): Payer: BC Managed Care – PPO | Admitting: Internal Medicine

## 2013-12-06 VITALS — BP 138/72 | HR 107 | Temp 97.4°F | Wt 196.5 lb

## 2013-12-06 DIAGNOSIS — R079 Chest pain, unspecified: Secondary | ICD-10-CM | POA: Insufficient documentation

## 2013-12-06 DIAGNOSIS — I209 Angina pectoris, unspecified: Secondary | ICD-10-CM | POA: Insufficient documentation

## 2013-12-06 DIAGNOSIS — Z0000 Encounter for general adult medical examination without abnormal findings: Secondary | ICD-10-CM

## 2013-12-06 DIAGNOSIS — R0789 Other chest pain: Secondary | ICD-10-CM

## 2013-12-06 DIAGNOSIS — E785 Hyperlipidemia, unspecified: Secondary | ICD-10-CM

## 2013-12-06 DIAGNOSIS — I951 Orthostatic hypotension: Secondary | ICD-10-CM

## 2013-12-06 LAB — CBC WITH DIFFERENTIAL/PLATELET
Basophils Absolute: 0.1 10*3/uL (ref 0.0–0.1)
Basophils Relative: 1.3 % (ref 0.0–3.0)
EOS PCT: 3.4 % (ref 0.0–5.0)
Eosinophils Absolute: 0.4 10*3/uL (ref 0.0–0.7)
HEMATOCRIT: 42.2 % (ref 39.0–52.0)
HEMOGLOBIN: 14.1 g/dL (ref 13.0–17.0)
LYMPHS ABS: 2.7 10*3/uL (ref 0.7–4.0)
Lymphocytes Relative: 26.3 % (ref 12.0–46.0)
MCHC: 33.3 g/dL (ref 30.0–36.0)
MCV: 93 fl (ref 78.0–100.0)
MONO ABS: 0.8 10*3/uL (ref 0.1–1.0)
Monocytes Relative: 7.6 % (ref 3.0–12.0)
Neutro Abs: 6.3 10*3/uL (ref 1.4–7.7)
Neutrophils Relative %: 61.4 % (ref 43.0–77.0)
PLATELETS: 308 10*3/uL (ref 150.0–400.0)
RBC: 4.54 Mil/uL (ref 4.22–5.81)
RDW: 14.2 % (ref 11.5–15.5)
WBC: 10.3 10*3/uL (ref 4.0–10.5)

## 2013-12-06 LAB — LIPID PANEL
CHOLESTEROL: 240 mg/dL — AB (ref 0–200)
HDL: 37.7 mg/dL — AB (ref >39.00)
LDL Cholesterol: 162 mg/dL — ABNORMAL HIGH (ref 0–99)
NonHDL: 202.3
TRIGLYCERIDES: 200 mg/dL — AB (ref 0.0–149.0)
Total CHOL/HDL Ratio: 6
VLDL: 40 mg/dL (ref 0.0–40.0)

## 2013-12-06 LAB — BASIC METABOLIC PANEL
BUN: 24 mg/dL — ABNORMAL HIGH (ref 6–23)
CALCIUM: 9.4 mg/dL (ref 8.4–10.5)
CO2: 26 mEq/L (ref 19–32)
Chloride: 104 mEq/L (ref 96–112)
Creatinine, Ser: 1.6 mg/dL — ABNORMAL HIGH (ref 0.4–1.5)
GFR: 47.38 mL/min — ABNORMAL LOW (ref >60.00)
GLUCOSE: 84 mg/dL (ref 70–99)
POTASSIUM: 4.8 meq/L (ref 3.5–5.1)
SODIUM: 138 meq/L (ref 135–145)

## 2013-12-06 LAB — HEPATIC FUNCTION PANEL
ALBUMIN: 3.9 g/dL (ref 3.5–5.2)
ALT: 6 U/L (ref 0–53)
AST: 15 U/L (ref 0–37)
Alkaline Phosphatase: 105 U/L (ref 39–117)
BILIRUBIN TOTAL: 1 mg/dL (ref 0.2–1.2)
Bilirubin, Direct: 0.1 mg/dL (ref 0.0–0.3)
TOTAL PROTEIN: 6.8 g/dL (ref 6.0–8.3)

## 2013-12-06 LAB — PSA: PSA: 3.65 ng/mL (ref 0.10–4.00)

## 2013-12-06 LAB — TSH: TSH: 3.01 u[IU]/mL (ref 0.35–4.50)

## 2013-12-06 MED ORDER — CYCLOBENZAPRINE HCL 5 MG PO TABS: 5.0000 mg | ORAL_TABLET | Freq: Three times a day (TID) | ORAL | Status: DC | PRN

## 2013-12-06 MED ORDER — TRAMADOL HCL 50 MG PO TABS: 50.0000 mg | ORAL_TABLET | Freq: Four times a day (QID) | ORAL | Status: DC | PRN

## 2013-12-06 NOTE — Assessment & Plan Note (Signed)

## 2013-12-06 NOTE — Assessment & Plan Note (Signed)
Ok for trial off the midodrine to gauge persistent symptoms, on non activity day (nothing scheduled), also to not drive for several days, to re-start for recurrent symptoms

## 2013-12-06 NOTE — Assessment & Plan Note (Signed)
With recent fall, c/w contusion , x 2 wks, to d/c ibuprofen, cont mobic prn, add tramadol prn, flexeril prn,  to f/u any worsening symptoms or concerns

## 2013-12-06 NOTE — Patient Instructions (Addendum)
Please take all new medication as prescribed - the tramadol for pain, and muscle relaxer as needed  OK to hold the midodrine if you do not have anything scheduled for a few days, and hold off on driving.  Please re-start the midrodrine for any worsening symptoms of dizziness and weakness  Please continue all other medications as before, and refills have been done if requested.  Please have the pharmacy call with any other refills you may need.  Please continue your efforts at being more active, low cholesterol diet, and weight control.  You are otherwise up to date with prevention measures today.  Please keep your appointments with your specialists as you may have planned  Please go to the LAB in the Basement (turn left off the elevator) for the tests to be done today  You will be contacted by phone if any changes need to be made immediately.  Otherwise, you will receive a letter about your results with an explanation, but please check with MyChart first.  Please remember to sign up for MyChart if you have not done so, as this will be important to you in the future with finding out test results, communicating by private email, and scheduling acute appointments online when needed.  Please return in 1 year for your yearly visit, or sooner if needed

## 2013-12-06 NOTE — Assessment & Plan Note (Signed)
To hold further statin due to intolerance with parkinsons

## 2013-12-06 NOTE — Addendum Note (Signed)
Addended by: Biagio Borg on: 12/06/2013 03:49 PM   Modules accepted: Orders

## 2013-12-06 NOTE — Progress Notes (Signed)
Pre visit review using our clinic review tool, if applicable. No additional management support is needed unless otherwise documented below in the visit note. 

## 2013-12-06 NOTE — Progress Notes (Signed)
Subjective:    Patient ID: John Herring, male    DOB: 1951-01-05, 63 y.o.   MRN: [id number]  HPI Here for wellness and f/u;  Overall doing ok;  Pt denies CP, worsening SOB, DOE, wheezing, orthopnea, PND, worsening LE edema, palpitations, dizziness or syncope.  Pt denies neurological change such as new headache, facial or extremity weakness.  Pt denies polydipsia, polyuria, or low sugar symptoms. Pt states overall good compliance with treatment and medications, good tolerability, and has been trying to follow lower cholesterol diet.  Pt denies worsening depressive symptoms, suicidal ideation or panic. No fever, night sweats, wt loss, loss of appetite, or other constitutional symptoms.  Pt states good ability with ADL's, has low fall risk, home safety reviewed and adequate, no other significant changes in hearing or vision, and only occasionally active with exercise.  Was in [place], at a second home, fell down stairs aug 4 to the basement, 7 steps, due to tripping only, landed on left chest/rib cage, shar severe pain with sob, seen in local ER, imaging neg, dx with contusion, tx with percocet 5 325 prn (only 8 pills) and ibuprofen 200 at 4 tabs qid.  Asks for different pain med.  Also taking meloxicam for pain Past Medical History  Diagnosis Date  . 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 Hypersomnia 05/06/2011  . Hyperlipidemia 05/06/2011  . Skin cancer 05/06/2011  . Depression 05/06/2011  . Memory loss 07/12/2012  . GERD (gastroesophageal reflux disease) 12/02/2012   Past Surgical History  Procedure Laterality Date  . Deep brain stimulation      Parkinson's disease  . Tonsillectomy      As a child  . Subthalamic stimulator battery replacement N/A 09/01/2012    Procedure: SUBTHALAMIC STIMULATOR BATTERY REPLACEMENT;  Surgeon: Erline Levine, MD;  Location: [facility];  Service: Neurosurgery;  Laterality: N/A;  Deep Brain Stimulator battery change    reports  that he has never smoked. He has never used smokeless tobacco. He reports that he does not drink alcohol or use illicit drugs. family history is negative for Colon cancer. No Known Allergies Current Outpatient Prescriptions on File Prior to Visit  Medication Sig Dispense Refill  . amantadine (SYMMETREL) 100 MG capsule Take 100 mg by mouth 2 (two) times daily.      . AZILECT 1 MG TABS Take 1 mg by mouth daily.       . carbidopa-levodopa (SINEMET) 25-100 MG per tablet 6 (six) times daily. Take 1/2 twice daily and 1 by mouth daily      . midodrine (PROAMATINE) 5 MG tablet TAKE 1 1/2 TABLETS BY MOUTH AT 7:30 AM, 1 TABLET AT 12:30 PM AND 1 TABLET AT 5:30 PM  45 tablet  2  . meloxicam (MOBIC) 15 MG tablet TAKE 1 TABLET BY MOUTH EVERY DAY  90 tablet  1   No current facility-administered medications on file prior to visit.     Review of Systems Constitutional: Negative for increased diaphoresis, other activity, appetite or other siginficant weight change  HENT: Negative for worsening hearing loss, ear pain, facial swelling, mouth sores and neck stiffness.   Eyes: Negative for other worsening pain, redness or visual disturbance.  Respiratory: Negative for shortness of breath and wheezing.   Cardiovascular: Negative for chest pain and palpitations.  Gastrointestinal: Negative for diarrhea, blood in stool, abdominal distention or other pain Genitourinary: Negative for hematuria, flank pain or change in urine volume.  Musculoskeletal: Negative for  myalgias or other joint complaints.  Skin: Negative for color change and wound.  Neurological: Negative for syncope and numbness. other than noted Hematological: Negative for adenopathy. or other swelling Psychiatric/Behavioral: Negative for hallucinations, self-injury, decreased concentration or other worsening agitation.      Objective:   Physical Exam BP 138/72  Pulse 107  Temp(Src) 97.4 F (36.3 C) (Oral)  Wt 196 lb 8 oz (89.132 kg)  SpO2  94% VS noted,  Constitutional: Pt is oriented to person, place, and time. Appears well-developed and well-nourished.  Head: Normocephalic and atraumatic.  Right Ear: External ear normal.  Left Ear: External ear normal.  Nose: Nose normal.  Mouth/Throat: Oropharynx is clear and moist.  Eyes: Conjunctivae and EOM are normal. Pupils are equal, round, and reactive to light.  Neck: Normal range of motion. Neck supple. No JVD present. No tracheal deviation present.  Cardiovascular: Normal rate, regular rhythm, normal heart sounds and intact distal pulses.   Pulmonary/Chest: Effort normal and breath sounds without rales or wheezing  Abdominal: Soft. Bowel sounds are normal. NT. No HSM  Musculoskeletal: Normal range of motion. Exhibits no edema.  Lymphadenopathy:  Has no cervical adenopathy.  Neurological: Pt is alert and oriented to person, place, and time. Pt has normal reflexes. No cranial nerve deficit. Motor grossly intact, has marked parkinson type movements Skin: Skin is warm and dry. No rash noted.  Psychiatric:  Has normal mood and affect. Behavior is normal.      Assessment & Plan:

## 2013-12-07 ENCOUNTER — Encounter: Payer: Self-pay | Admitting: Internal Medicine

## 2013-12-07 LAB — URINALYSIS, ROUTINE W REFLEX MICROSCOPIC
BILIRUBIN URINE: NEGATIVE
HGB URINE DIPSTICK: NEGATIVE
KETONES UR: NEGATIVE
LEUKOCYTES UA: NEGATIVE
NITRITE: NEGATIVE
RBC / HPF: NONE SEEN (ref 0–?)
Specific Gravity, Urine: 1.01 (ref 1.000–1.030)
Total Protein, Urine: NEGATIVE
Urine Glucose: NEGATIVE
Urobilinogen, UA: 0.2 (ref 0.0–1.0)
pH: 7 (ref 5.0–8.0)

## 2014-01-17 ENCOUNTER — Other Ambulatory Visit: Payer: Self-pay

## 2014-01-17 MED ORDER — MIDODRINE HCL 5 MG PO TABS: ORAL_TABLET | ORAL | Status: DC

## 2014-03-07 ENCOUNTER — Other Ambulatory Visit: Payer: Self-pay | Admitting: Internal Medicine

## 2014-06-09 ENCOUNTER — Other Ambulatory Visit (INDEPENDENT_AMBULATORY_CARE_PROVIDER_SITE_OTHER): Payer: BC Managed Care – PPO

## 2014-06-09 ENCOUNTER — Ambulatory Visit (INDEPENDENT_AMBULATORY_CARE_PROVIDER_SITE_OTHER): Payer: BC Managed Care – PPO | Admitting: Internal Medicine

## 2014-06-09 ENCOUNTER — Encounter: Payer: Self-pay | Admitting: Internal Medicine

## 2014-06-09 VITALS — BP 102/68 | HR 88 | Temp 98.6°F | Wt 204.0 lb

## 2014-06-09 DIAGNOSIS — R972 Elevated prostate specific antigen [PSA]: Secondary | ICD-10-CM | POA: Diagnosis not present

## 2014-06-09 DIAGNOSIS — E785 Hyperlipidemia, unspecified: Secondary | ICD-10-CM

## 2014-06-09 DIAGNOSIS — N189 Chronic kidney disease, unspecified: Secondary | ICD-10-CM | POA: Insufficient documentation

## 2014-06-09 DIAGNOSIS — F329 Major depressive disorder, single episode, unspecified: Secondary | ICD-10-CM

## 2014-06-09 DIAGNOSIS — N182 Chronic kidney disease, stage 2 (mild): Secondary | ICD-10-CM

## 2014-06-09 DIAGNOSIS — N183 Chronic kidney disease, stage 3 unspecified: Secondary | ICD-10-CM | POA: Insufficient documentation

## 2014-06-09 DIAGNOSIS — F32A Depression, unspecified: Secondary | ICD-10-CM

## 2014-06-09 LAB — BASIC METABOLIC PANEL
BUN: 19 mg/dL (ref 6–23)
CHLORIDE: 106 meq/L (ref 96–112)
CO2: 27 meq/L (ref 19–32)
Calcium: 10.1 mg/dL (ref 8.4–10.5)
Creatinine, Ser: 1.57 mg/dL — ABNORMAL HIGH (ref 0.40–1.50)
GFR: 47.65 mL/min — ABNORMAL LOW (ref >60.00)
Glucose, Bld: 78 mg/dL (ref 70–99)
Potassium: 5.2 mEq/L — ABNORMAL HIGH (ref 3.5–5.1)
Sodium: 139 mEq/L (ref 135–145)

## 2014-06-09 LAB — PSA: PSA: 3.58 ng/mL (ref 0.10–4.00)

## 2014-06-09 NOTE — Progress Notes (Signed)
Pre visit review using our clinic review tool, if applicable. No additional management support is needed unless otherwise documented below in the visit note. 

## 2014-06-09 NOTE — Progress Notes (Signed)
Subjective:    Patient ID: John Herring, male    DOB: 1950-07-17, 64 y.o.   MRN: [id number]  HPI  Here to f/u; overall doing ok,  Pt denies chest pain, increased sob or doe, wheezing, orthopnea, PND, increased LE swelling, palpitations, dizziness or syncope.  Pt denies polydipsia, polyuria, or low sugar symptoms such as weakness or confusion improved with po intake.  Pt denies new neurological symptoms such as new headache, or facial or extremity weakness or numbness.   Pt states overall good compliance with meds, has been trying to follow lower cholesterol diet, with wt overall stable,  but little exercise however. Cont's to see [facility] neurology, s/p DBS, no new complaints.  Still drives locally. No recent accidents.  Has had several falls, one in the past month at night disoriented and struck head on furnitture with scalp abrasion.  Overall much improved however MSK control off the statin, which seemed to also make the dizziness worse as well Denies worsening depressive symptoms, suicidal ideation, or panic Past Medical History  Diagnosis Date  . 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 Hypersomnia 05/06/2011  . Hyperlipidemia 05/06/2011  . Skin cancer 05/06/2011  . Depression 05/06/2011  . Memory loss 07/12/2012  . GERD (gastroesophageal reflux disease) 12/02/2012   Past Surgical History  Procedure Laterality Date  . Deep brain stimulation      Parkinson's disease  . Tonsillectomy      As a child  . Subthalamic stimulator battery replacement N/A 09/01/2012    Procedure: SUBTHALAMIC STIMULATOR BATTERY REPLACEMENT;  Surgeon: Erline Levine, MD;  Location: [facility];  Service: Neurosurgery;  Laterality: N/A;  Deep Brain Stimulator battery change    reports that he has never smoked. He has never used smokeless tobacco. He reports that he does not drink alcohol or use illicit drugs. family history is negative for Colon cancer. Allergies  Allergen Reactions  .  Statins     Makes parkinson worse   Current Outpatient Prescriptions on File Prior to Visit  Medication Sig Dispense Refill  . amantadine (SYMMETREL) 100 MG capsule Take 100 mg by mouth 2 (two) times daily.    . AZILECT 1 MG TABS Take 1 mg by mouth daily.     . carbidopa-levodopa (SINEMET) 25-100 MG per tablet Take 1 tablet by mouth 6 (six) times daily.     . meloxicam (MOBIC) 15 MG tablet TAKE 1 TABLET BY MOUTH EVERY DAY 90 tablet 1  . midodrine (PROAMATINE) 5 MG tablet Take 1 1/2 tablets by mouth at 7:30 AM, 1 tablet at 12:30 PM and 1 tablet at 5:30 PM. 45 tablet 2   No current facility-administered medications on file prior to visit.   Review of Systems  Constitutional: Negative for unusual diaphoresis or other sweats  HENT: Negative for ringing in ear Eyes: Negative for double vision or worsening visual disturbance.  Respiratory: Negative for choking and stridor.   Gastrointestinal: Negative for vomiting or other signifcant bowel change Genitourinary: Negative for hematuria or decreased urine volume.  Musculoskeletal: Negative for other MSK pain or swelling Skin: Negative for color change and worsening wound.  Neurological: Negative for tremors and numbness other than noted  Psychiatric/Behavioral: Negative for decreased concentration or agitation other than above       Objective:   Physical Exam BP 102/68 mmHg  Pulse 88  Temp(Src) 98.6 F (37 C) (Oral)  Wt 204 lb (92.534 kg) VS noted,  Constitutional: Pt appears well-developed, well-nourished.  HENT: Head: NCAT.  Right Ear: External ear normal.  Left Ear: External ear normal.  Eyes: . Pupils are equal, round, and reactive to light. Conjunctivae and EOM are normal Neck: Normal range of motion. Neck supple.  Cardiovascular: Normal rate and regular rhythm.   Pulmonary/Chest: Effort normal and breath sounds without rales or wheezing.  Abd:  Soft, NT, ND, + BS Neurological: Pt is alert. Not confused , motor grossly intact,  + parkinsonian movements, masked facies Skin: Skin is warm. No rash Psychiatric: Pt behavior is normal. No agitation.     Assessment & Plan:

## 2014-06-09 NOTE — Patient Instructions (Signed)

## 2014-06-09 NOTE — Assessment & Plan Note (Signed)
stable overall by history and exam, recent data reviewed with pt, and pt to continue medical treatment as before,  to f/u any worsening symptoms or concerns, has been statin intoilerant, not wanting to consider PCSK9 inhibitor at this time Lab Results  Component Value Date   CHOL 240* 12/06/2013   HDL 37.70* 12/06/2013   LDLCALC 162* 12/06/2013   LDLDIRECT 179.1 06/13/2011   TRIG 200.0* 12/06/2013   CHOLHDL 6 12/06/2013

## 2014-06-09 NOTE — Assessment & Plan Note (Signed)
Mild last visit, ? Transient related to volume, for f/u lab today

## 2014-06-09 NOTE — Assessment & Plan Note (Signed)
?   Worsening psa velocity - for psa repeat today

## 2014-06-09 NOTE — Assessment & Plan Note (Signed)
stable overall by history and exam, recent data reviewed with pt, and pt to continue medical treatment as before,  to f/u any worsening symptoms or concerns Lab Results  Component Value Date   WBC 10.3 12/06/2013   HGB 14.1 12/06/2013   HCT 42.2 12/06/2013   PLT 308.0 12/06/2013   GLUCOSE 84 12/06/2013   CHOL 240* 12/06/2013   TRIG 200.0* 12/06/2013   HDL 37.70* 12/06/2013   LDLDIRECT 179.1 06/13/2011   LDLCALC 162* 12/06/2013   ALT 6 12/06/2013   AST 15 12/06/2013   NA 138 12/06/2013   K 4.8 12/06/2013   CL 104 12/06/2013   CREATININE 1.6* 12/06/2013   BUN 24* 12/06/2013   CO2 26 12/06/2013   TSH 3.01 12/06/2013   PSA 3.65 12/06/2013

## 2014-06-13 ENCOUNTER — Encounter: Payer: Self-pay | Admitting: Internal Medicine

## 2014-09-01 ENCOUNTER — Other Ambulatory Visit: Payer: Self-pay | Admitting: Internal Medicine

## 2014-12-08 ENCOUNTER — Encounter: Payer: BC Managed Care – PPO | Admitting: Internal Medicine

## 2014-12-08 ENCOUNTER — Ambulatory Visit (INDEPENDENT_AMBULATORY_CARE_PROVIDER_SITE_OTHER): Payer: BC Managed Care – PPO | Admitting: Internal Medicine

## 2014-12-08 ENCOUNTER — Encounter: Payer: Self-pay | Admitting: Internal Medicine

## 2014-12-08 ENCOUNTER — Other Ambulatory Visit (INDEPENDENT_AMBULATORY_CARE_PROVIDER_SITE_OTHER): Payer: BC Managed Care – PPO

## 2014-12-08 VITALS — BP 124/82 | HR 90 | Temp 98.4°F | Ht 71.0 in | Wt 194.0 lb

## 2014-12-08 DIAGNOSIS — Z0000 Encounter for general adult medical examination without abnormal findings: Secondary | ICD-10-CM | POA: Diagnosis not present

## 2014-12-08 LAB — URINALYSIS, ROUTINE W REFLEX MICROSCOPIC
Bilirubin Urine: NEGATIVE
HGB URINE DIPSTICK: NEGATIVE
KETONES UR: NEGATIVE
Leukocytes, UA: NEGATIVE
NITRITE: NEGATIVE
SPECIFIC GRAVITY, URINE: 1.02 (ref 1.000–1.030)
Urine Glucose: NEGATIVE
Urobilinogen, UA: 0.2 (ref 0.0–1.0)
pH: 6.5 (ref 5.0–8.0)

## 2014-12-08 LAB — HEPATIC FUNCTION PANEL
ALK PHOS: 77 U/L (ref 39–117)
ALT: 2 U/L (ref 0–53)
AST: 13 U/L (ref 0–37)
Albumin: 4.2 g/dL (ref 3.5–5.2)
BILIRUBIN TOTAL: 0.6 mg/dL (ref 0.2–1.2)
Bilirubin, Direct: 0.1 mg/dL (ref 0.0–0.3)
Total Protein: 6.6 g/dL (ref 6.0–8.3)

## 2014-12-08 LAB — LIPID PANEL
CHOL/HDL RATIO: 5
CHOLESTEROL: 229 mg/dL — AB (ref 0–200)
HDL: 43.3 mg/dL (ref >39.00)
LDL CALC: 154 mg/dL — AB (ref 0–99)
NonHDL: 185.39
Triglycerides: 156 mg/dL — ABNORMAL HIGH (ref 0.0–149.0)
VLDL: 31.2 mg/dL (ref 0.0–40.0)

## 2014-12-08 LAB — CBC WITH DIFFERENTIAL/PLATELET
BASOS PCT: 0.6 % (ref 0.0–3.0)
Basophils Absolute: 0.1 10*3/uL (ref 0.0–0.1)
EOS PCT: 0.7 % (ref 0.0–5.0)
Eosinophils Absolute: 0.1 10*3/uL (ref 0.0–0.7)
HCT: 45.5 % (ref 39.0–52.0)
Hemoglobin: 15.4 g/dL (ref 13.0–17.0)
LYMPHS ABS: 1.8 10*3/uL (ref 0.7–4.0)
Lymphocytes Relative: 19.3 % (ref 12.0–46.0)
MCHC: 33.7 g/dL (ref 30.0–36.0)
MCV: 93.1 fl (ref 78.0–100.0)
MONO ABS: 0.6 10*3/uL (ref 0.1–1.0)
Monocytes Relative: 7 % (ref 3.0–12.0)
NEUTROS PCT: 72.4 % (ref 43.0–77.0)
Neutro Abs: 6.6 10*3/uL (ref 1.4–7.7)
Platelets: 207 10*3/uL (ref 150.0–400.0)
RBC: 4.89 Mil/uL (ref 4.22–5.81)
RDW: 13.9 % (ref 11.5–15.5)
WBC: 9.2 10*3/uL (ref 4.0–10.5)

## 2014-12-08 LAB — TSH: TSH: 2.37 u[IU]/mL (ref 0.35–4.50)

## 2014-12-08 LAB — BASIC METABOLIC PANEL
BUN: 23 mg/dL (ref 6–23)
CO2: 27 mEq/L (ref 19–32)
Calcium: 9.4 mg/dL (ref 8.4–10.5)
Chloride: 105 mEq/L (ref 96–112)
Creatinine, Ser: 1.27 mg/dL (ref 0.40–1.50)
GFR: 60.76 mL/min (ref >60.00)
Glucose, Bld: 101 mg/dL — ABNORMAL HIGH (ref 70–99)
POTASSIUM: 4.8 meq/L (ref 3.5–5.1)
SODIUM: 139 meq/L (ref 135–145)

## 2014-12-08 LAB — PSA: PSA: 4 ng/mL (ref 0.10–4.00)

## 2014-12-08 NOTE — Progress Notes (Signed)
Pre visit review using our clinic review tool, if applicable. No additional management support is needed unless otherwise documented below in the visit note. 

## 2014-12-08 NOTE — Progress Notes (Signed)
Subjective:    Patient ID: John Herring, male    DOB: 1950-10-03, 64 y.o.   MRN: [id number]  HPI  Here for wellness and f/u;  Overall doing ok;  Pt denies Chest pain, worsening SOB, DOE, wheezing, orthopnea, PND, worsening LE edema, palpitations, dizziness or syncope.  Pt denies neurological change such as new headache, facial or extremity weakness.  Pt denies polydipsia, polyuria, or low sugar symptoms. Pt states overall good compliance with treatment and medications, good tolerability, and has been trying to follow appropriate diet.  Pt denies worsening depressive symptoms, suicidal ideation or panic. No fever, night sweats, wt loss, loss of appetite, or other constitutional symptoms.  Pt states good ability with ADL's, has low fall risk, home safety reviewed and adequate, no other significant changes in hearing or vision, and only occasionally active with exercise.  Has ongoing parkinsons with several falls recently, no specific injury complaints Past Medical History  Diagnosis Date  . 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 Hypersomnia 05/06/2011  . Hyperlipidemia 05/06/2011  . Skin cancer 05/06/2011  . Depression 05/06/2011  . Memory loss 07/12/2012  . GERD (gastroesophageal reflux disease) 12/02/2012   Past Surgical History  Procedure Laterality Date  . Deep brain stimulation      Parkinson's disease  . Tonsillectomy      As a child  . Subthalamic stimulator battery replacement N/A 09/01/2012    Procedure: SUBTHALAMIC STIMULATOR BATTERY REPLACEMENT;  Surgeon: Erline Levine, MD;  Location: [facility];  Service: Neurosurgery;  Laterality: N/A;  Deep Brain Stimulator battery change    reports that he has never smoked. He has never used smokeless tobacco. He reports that he does not drink alcohol or use illicit drugs. family history is negative for Colon cancer. Allergies  Allergen Reactions  . Statins     Makes parkinson worse   Current  Outpatient Prescriptions on File Prior to Visit  Medication Sig Dispense Refill  . amantadine (SYMMETREL) 100 MG capsule Take 100 mg by mouth 2 (two) times daily.    . AZILECT 1 MG TABS Take 1 mg by mouth daily.     . carbidopa-levodopa (SINEMET) 25-100 MG per tablet Take 1 tablet by mouth 6 (six) times daily.     . meloxicam (MOBIC) 15 MG tablet TAKE 1 TABLET BY MOUTH EVERY DAY 90 tablet 1  . midodrine (PROAMATINE) 5 MG tablet Take 1 1/2 tablets by mouth at 7:30 AM, 1 tablet at 12:30 PM and 1 tablet at 5:30 PM. (Patient not taking: Reported on 12/08/2014) 45 tablet 2   No current facility-administered medications on file prior to visit.   Review of Systems Constitutional: Negative for increased diaphoresis, other activity, appetite or siginficant weight change other than noted HENT: Negative for worsening hearing loss, ear pain, facial swelling, mouth sores and neck stiffness.   Eyes: Negative for other worsening pain, redness or visual disturbance.  Respiratory: Negative for shortness of breath and wheezing  Cardiovascular: Negative for chest pain and palpitations.  Gastrointestinal: Negative for diarrhea, blood in stool, abdominal distention or other pain Genitourinary: Negative for hematuria, flank pain or change in urine volume.  Musculoskeletal: Negative for myalgias or other joint complaints.  Skin: Negative for color change and wound or drainage.  Neurological: Negative for syncope and numbness. other than noted Hematological: Negative for adenopathy. or other swelling Psychiatric/Behavioral: Negative for hallucinations, SI, self-injury, decreased concentration or other worsening agitation.      Objective:   Physical  Exam BP 124/82 mmHg  Pulse 90  Temp(Src) 98.4 F (36.9 C) (Oral)  Ht 5\' 11"  (1.803 m)  Wt 194 lb (87.998 kg)  BMI 27.07 kg/m2  SpO2 95% VS noted,  Constitutional: Pt is oriented to person, place, and time. Appears well-developed and well-nourished, in no  significant distress Head: Normocephalic and atraumatic.  Right Ear: External ear normal.  Left Ear: External ear normal.  Nose: Nose normal.  Mouth/Throat: Oropharynx is clear and moist.  Eyes: Conjunctivae and EOM are normal. Pupils are equal, round, and reactive to light.  Neck: Normal range of motion. Neck supple. No JVD present. No tracheal deviation present or significant neck LA or mass Cardiovascular: Normal rate, regular rhythm, normal heart sounds and intact distal pulses.   Pulmonary/Chest: Effort normal and breath sounds without rales or wheezing  Abdominal: Soft. Bowel sounds are normal. NT. No HSM  Musculoskeletal: Normal range of motion. Exhibits no edema.  Lymphadenopathy:  Has no cervical adenopathy.  Neurological: Pt is alert and oriented to person, place, and time. Pt has normal reflexes. No cranial nerve deficit. Motor grossly intact, o/w not done in detail, has signficant parkinsonian movement today Skin: Skin is warm and dry. No rash noted.  Psychiatric:  Has normal mood and affect. Behavior is normal.     Assessment & Plan:

## 2014-12-08 NOTE — Patient Instructions (Signed)

## 2014-12-08 NOTE — Assessment & Plan Note (Signed)

## 2014-12-18 ENCOUNTER — Telehealth: Payer: Self-pay | Admitting: *Deleted

## 2014-12-18 MED ORDER — MIDODRINE HCL 5 MG PO TABS: ORAL_TABLET | ORAL | Status: DC

## 2014-12-18 NOTE — Telephone Encounter (Signed)
Pt states he is needing refills on his midodrine. Verified pharmacy inform pt send it to [facility],,,/lmb

## 2014-12-28 ENCOUNTER — Telehealth: Payer: Self-pay | Admitting: Internal Medicine

## 2014-12-28 MED ORDER — MIDODRINE HCL 5 MG PO TABS: ORAL_TABLET | ORAL | Status: DC

## 2014-12-28 NOTE — Telephone Encounter (Signed)
The midocrine is to help keep the blood pressure up and less dizzy  It does not cause lower BP  The current dosing is apparently not working well  Patients have taken up to 20 mg at a time and done ok  For now, ok to increase to 2 tabs in AM, then 1.5 tab at 1230 PM, then 1.5 tab at 530 PM - done erx

## 2014-12-28 NOTE — Telephone Encounter (Signed)
Since he has been taking midodrine (PROAMATINE) 5 MG tablet [[id number] he has been feeling very dizzy especially in the morning. He says last week bp read 72/53, but this week has been in the normal range Can you please call to discuss

## 2014-12-28 NOTE — Telephone Encounter (Signed)
Pt states that he has been experiencing dizziness x 2 week (prior to starting Midodrine). He has had this before and was told that is was due to low blood pressure. Pt is requesting MD advisement on whether he should continue with Midodrine or D/C, please advise

## 2014-12-28 NOTE — Telephone Encounter (Signed)
Pt informed and understood

## 2015-02-02 ENCOUNTER — Ambulatory Visit (INDEPENDENT_AMBULATORY_CARE_PROVIDER_SITE_OTHER): Payer: BC Managed Care – PPO | Admitting: Internal Medicine

## 2015-02-02 ENCOUNTER — Encounter: Payer: Self-pay | Admitting: Internal Medicine

## 2015-02-02 VITALS — BP 114/78 | HR 88 | Temp 98.5°F | Ht 69.0 in | Wt 191.0 lb

## 2015-02-02 DIAGNOSIS — F32A Depression, unspecified: Secondary | ICD-10-CM

## 2015-02-02 DIAGNOSIS — F329 Major depressive disorder, single episode, unspecified: Secondary | ICD-10-CM

## 2015-02-02 DIAGNOSIS — G20 Parkinson's disease: Secondary | ICD-10-CM | POA: Diagnosis not present

## 2015-02-02 DIAGNOSIS — I951 Orthostatic hypotension: Secondary | ICD-10-CM

## 2015-02-02 MED ORDER — MIDODRINE HCL 10 MG PO TABS: 10.0000 mg | ORAL_TABLET | Freq: Three times a day (TID) | ORAL | Status: DC

## 2015-02-02 NOTE — Patient Instructions (Addendum)
Ok to increase the midodrine to 10 mg three times daily  Please continue all other medications as before, and refills have been done if requested.  Please have the pharmacy call with any other refills you may need.  Please continue your efforts at being more active, low cholesterol diet, and weight control.  Please keep your appointments with your specialists as you may have planned - Dr Levora Angel at [facility]

## 2015-02-02 NOTE — Progress Notes (Signed)
Pre visit review using our clinic review tool, if applicable. No additional management support is needed unless otherwise documented below in the visit note. 

## 2015-02-02 NOTE — Progress Notes (Signed)
Subjective:    Patient ID: John Herring, male    DOB: 21-Nov-1950, 64 y.o.   MRN: [id number]  HPI  Here to f/u, unfortunately with falls daily and more frequent with recurring orthostatic symptoms , feels "overwhelmed" and just goes down. Sees Neurology Dr Levora Angel at [facility], and done relatively well on current meds, but seems not working as well in last 2 months.  Pt denies chest pain, increased sob or doe, wheezing, orthopnea, PND, increased LE swelling, palpitations, dizziness or syncope. Pt denies new neurological symptoms such as new headache, or facial or extremity weakness or numbness   Pt denies polydipsia, polyuria,  Denies worsening depressive symptoms, and no suicidal ideation, or panic Past Medical History  Diagnosis Date  . 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[place]) 05/06/2011  . 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 Hypersomnia 05/06/2011  . Hyperlipidemia 05/06/2011  . Skin cancer 05/06/2011  . Depression 05/06/2011  . Memory loss 07/12/2012  . GERD (gastroesophageal reflux disease) 12/02/2012   Past Surgical History  Procedure Laterality Date  . Deep brain stimulation      Parkinson's disease  . Tonsillectomy      As a child  . Subthalamic stimulator battery replacement N/A 09/01/2012    Procedure: SUBTHALAMIC STIMULATOR BATTERY REPLACEMENT;  Surgeon: Erline Levine, MD;  Location: [place];  Service: Neurosurgery;  Laterality: N/A;  Deep Brain Stimulator battery change    reports that he has never smoked. He has never used smokeless tobacco. He reports that he does not drink alcohol or use illicit drugs. family history is negative for Colon cancer. Allergies  Allergen Reactions  . Statins     Makes parkinson worse   Current Outpatient Prescriptions on File Prior to Visit  Medication Sig Dispense Refill  . amantadine (SYMMETREL) 100 MG capsule Take 100 mg by mouth 2 (two) times daily.    . AZILECT 1 MG TABS Take 1 mg by mouth daily.     . carbidopa-levodopa (SINEMET) 25-100 MG per  tablet Take 1 tablet by mouth 6 (six) times daily.     . meloxicam (MOBIC) 15 MG tablet TAKE 1 TABLET BY MOUTH EVERY DAY 90 tablet 1   No current facility-administered medications on file prior to visit.   Review of Systems  Constitutional: Negative for unusual diaphoresis or night sweats HENT: Negative for ringing in ear or discharge Eyes: Negative for double vision or worsening visual disturbance.  Respiratory: Negative for choking and stridor.   Gastrointestinal: Negative for vomiting or other signifcant bowel change Genitourinary: Negative for hematuria or change in urine volume.  Musculoskeletal: Negative for other MSK pain or swelling Skin: Negative for color change and worsening wound.  Neurological: Negative for tremors and numbness other than noted  Psychiatric/Behavioral: Negative for decreased concentration or agitation other than above       Objective:   Physical Exam BP 114/78 mmHg  Pulse 88  Temp(Src) 98.5 F (36.9 C) (Oral)  Ht 5\' 9"  (1.753 m)  Wt 191 lb (86.637 kg)  BMI 28.19 kg/m2  SpO2 96% VS noted,  Constitutional: Pt appears in no significant distress HENT: Head: NCAT.  Right Ear: External ear normal.  Left Ear: External ear normal.  Eyes: . Pupils are equal, round, and reactive to light. Conjunctivae and EOM are normal Neck: Normal range of motion. Neck supple.  Cardiovascular: Normal rate and regular rhythm.   Pulmonary/Chest: Effort normal and breath sounds without rales or wheezing.  Abd:  Soft, NT, ND, + BS Neurological: Pt is  alert. Not confused , motor grossly intact, marked parkinson movements of head and extremities Skin: Skin is warm. No rash, no LE edema Psychiatric: Pt behavior is normal. No agitation.     Assessment & Plan:

## 2015-02-03 NOTE — Assessment & Plan Note (Signed)
Worsening symptomatically, for increased midodrine to 10 tid,  to f/u any worsening symptoms or concerns, consider trial florinef

## 2015-02-03 NOTE — Assessment & Plan Note (Signed)
stable overall by history and exam, recent data reviewed with pt, and pt to continue medical treatment as before,  to f/u any worsening symptoms or concerns Lab Results  Component Value Date   WBC 9.2 12/08/2014   HGB 15.4 12/08/2014   HCT 45.5 12/08/2014   PLT 207.0 12/08/2014   GLUCOSE 101* 12/08/2014   CHOL 229* 12/08/2014   TRIG 156.0* 12/08/2014   HDL 43.30 12/08/2014   LDLDIRECT 179.1 06/13/2011   LDLCALC 154* 12/08/2014   ALT 2 12/08/2014   AST 13 12/08/2014   NA 139 12/08/2014   K 4.8 12/08/2014   CL 105 12/08/2014   CREATININE 1.27 12/08/2014   BUN 23 12/08/2014   CO2 27 12/08/2014   TSH 2.37 12/08/2014   PSA 4.00 12/08/2014

## 2015-02-03 NOTE — Assessment & Plan Note (Signed)
Cont same dose sinamet, for neurology f/u as planned,  to f/u any worsening symptoms or concerns

## 2015-02-09 ENCOUNTER — Encounter: Payer: Self-pay | Admitting: Internal Medicine

## 2015-02-09 ENCOUNTER — Ambulatory Visit (INDEPENDENT_AMBULATORY_CARE_PROVIDER_SITE_OTHER): Payer: BC Managed Care – PPO | Admitting: Internal Medicine

## 2015-02-09 VITALS — BP 118/70 | HR 98 | Temp 98.4°F | Wt 193.0 lb

## 2015-02-09 DIAGNOSIS — G20 Parkinson's disease: Secondary | ICD-10-CM | POA: Diagnosis not present

## 2015-02-09 DIAGNOSIS — I951 Orthostatic hypotension: Secondary | ICD-10-CM | POA: Diagnosis not present

## 2015-02-09 DIAGNOSIS — Z23 Encounter for immunization: Secondary | ICD-10-CM

## 2015-02-09 DIAGNOSIS — E785 Hyperlipidemia, unspecified: Secondary | ICD-10-CM | POA: Diagnosis not present

## 2015-02-09 NOTE — Patient Instructions (Addendum)
You had the flu shot today  Please continue all other medications as before, and refills have been done if requested.  Please have the pharmacy call with any other refills you may need.  Please continue your efforts at being more active, low cholesterol diet, and weight control.  Please keep your appointments with your specialists as you may have planned - Neurology at [facility] in Dec 2016

## 2015-02-09 NOTE — Assessment & Plan Note (Signed)
With recent much worsening falls, now on high dose midodrine and much improved with no falls in the past wk, to cont current meds,  to f/u any worsening symptoms or concerns

## 2015-02-09 NOTE — Progress Notes (Signed)
Subjective:    Patient ID: John Herring, male    DOB: 07-May-1950, 64 y.o.   MRN: [id number]  HPI  Here to f/u, overall doing very well with NO falls in the past wk.  Still has some occas dizziness but overall quite happy with results.  Pt denies chest pain, increased sob or doe, wheezing, orthopnea, PND, increased LE swelling, palpitations, dizziness or syncope.  Pt denies new neurological symptoms such as new headache, or facial or extremity weakness or numbness   Pt denies polydipsia, polyuria.  Has not been able to tolerate statins in the past Past Medical History  Diagnosis Date  . 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 Hypersomnia 05/06/2011  . Hyperlipidemia 05/06/2011  . Skin cancer 05/06/2011  . Depression 05/06/2011  . Memory loss 07/12/2012  . GERD (gastroesophageal reflux disease) 12/02/2012   Past Surgical History  Procedure Laterality Date  . Deep brain stimulation      Parkinson's disease  . Tonsillectomy      As a child  . Subthalamic stimulator battery replacement N/A 09/01/2012    Procedure: SUBTHALAMIC STIMULATOR BATTERY REPLACEMENT;  Surgeon: Erline Levine, MD;  Location: [facility];  Service: Neurosurgery;  Laterality: N/A;  Deep Brain Stimulator battery change    reports that he has never smoked. He has never used smokeless tobacco. He reports that he does not drink alcohol or use illicit drugs. family history is negative for Colon cancer. Allergies  Allergen Reactions  . Statins     Makes parkinson worse   Current Outpatient Prescriptions on File Prior to Visit  Medication Sig Dispense Refill  . amantadine (SYMMETREL) 100 MG capsule Take 100 mg by mouth 2 (two) times daily.    . AZILECT 1 MG TABS Take 1 mg by mouth daily.     . carbidopa-levodopa (SINEMET) 25-100 MG per tablet Take 1 tablet by mouth 6 (six) times daily.     . meloxicam (MOBIC) 15 MG tablet TAKE 1 TABLET BY MOUTH EVERY DAY 90 tablet 1  . midodrine  (PROAMATINE) 10 MG tablet Take 1 tablet (10 mg total) by mouth 3 (three) times daily. 90 tablet 11   No current facility-administered medications on file prior to visit.   Review of Systems  Constitutional: Negative for unusual diaphoresis or night sweats HENT: Negative for ringing in ear or discharge Eyes: Negative for double vision or worsening visual disturbance.  Respiratory: Negative for choking and stridor.   Gastrointestinal: Negative for vomiting or other signifcant bowel change Genitourinary: Negative for hematuria or change in urine volume.  Musculoskeletal: Negative for other MSK pain or swelling Skin: Negative for color change and worsening wound.  Neurological: Negative for tremors and numbness other than noted  Psychiatric/Behavioral: Negative for decreased concentration or agitation other than above       Objective:   Physical Exam BP 118/70 mmHg  Pulse 98  Temp(Src) 98.4 F (36.9 C)  Wt 193 lb (87.544 kg)  SpO2 95% VS noted, not ill appearing Constitutional: Pt appears in no significant distress HENT: Head: NCAT.  Right Ear: External ear normal.  Left Ear: External ear normal.  Eyes: . Pupils are equal, round, and reactive to light. Conjunctivae and EOM are normal Neck: Normal range of motion. Neck supple.  Cardiovascular: Normal rate and regular rhythm.   Pulmonary/Chest: Effort normal and breath sounds without rales or wheezing.  Abd:  Soft, NT, ND, + BS Neurological: Pt is alert. Not confused ,  motor grossly intact, less parkinson Skin: Skin is warm. No rash, no LE edema Psychiatric: Pt behavior is normal. No agitation.     Assessment & Plan:

## 2015-02-09 NOTE — Assessment & Plan Note (Signed)
Stable, for f/u [facility] neurology as planned

## 2015-02-09 NOTE — Assessment & Plan Note (Signed)
Statin intolerant, d/w pt - for lower chol diet  Lab Results  Component Value Date   LDLCALC 154* 12/08/2014

## 2015-02-09 NOTE — Progress Notes (Signed)
Pre visit review using our clinic review tool, if applicable. No additional management support is needed unless otherwise documented below in the visit note. 

## 2015-03-05 ENCOUNTER — Other Ambulatory Visit: Payer: Self-pay | Admitting: Internal Medicine

## 2015-03-05 ENCOUNTER — Other Ambulatory Visit: Payer: Self-pay

## 2015-03-05 MED ORDER — MELOXICAM 15 MG PO TABS: 15.0000 mg | ORAL_TABLET | Freq: Every day | ORAL | Status: DC

## 2015-06-12 ENCOUNTER — Other Ambulatory Visit (INDEPENDENT_AMBULATORY_CARE_PROVIDER_SITE_OTHER): Payer: BC Managed Care – PPO

## 2015-06-12 ENCOUNTER — Encounter: Payer: Self-pay | Admitting: Internal Medicine

## 2015-06-12 ENCOUNTER — Ambulatory Visit (INDEPENDENT_AMBULATORY_CARE_PROVIDER_SITE_OTHER): Payer: BC Managed Care – PPO | Admitting: Internal Medicine

## 2015-06-12 VITALS — BP 130/82 | HR 90 | Temp 98.7°F | Resp 20 | Wt 185.0 lb

## 2015-06-12 DIAGNOSIS — Z0000 Encounter for general adult medical examination without abnormal findings: Secondary | ICD-10-CM | POA: Diagnosis not present

## 2015-06-12 DIAGNOSIS — R7989 Other specified abnormal findings of blood chemistry: Secondary | ICD-10-CM

## 2015-06-12 LAB — CBC WITH DIFFERENTIAL/PLATELET
Basophils Absolute: 0.1 10*3/uL (ref 0.0–0.1)
Basophils Relative: 1.1 % (ref 0.0–3.0)
EOS ABS: 0 10*3/uL (ref 0.0–0.7)
Eosinophils Relative: 0.4 % (ref 0.0–5.0)
HCT: 43.5 % (ref 39.0–52.0)
HEMOGLOBIN: 14.7 g/dL (ref 13.0–17.0)
Lymphocytes Relative: 20.1 % (ref 12.0–46.0)
Lymphs Abs: 1.9 10*3/uL (ref 0.7–4.0)
MCHC: 33.8 g/dL (ref 30.0–36.0)
MCV: 91.1 fl (ref 78.0–100.0)
MONO ABS: 0.8 10*3/uL (ref 0.1–1.0)
Monocytes Relative: 8.6 % (ref 3.0–12.0)
Neutro Abs: 6.5 10*3/uL (ref 1.4–7.7)
Neutrophils Relative %: 69.8 % (ref 43.0–77.0)
Platelets: 218 10*3/uL (ref 150.0–400.0)
RBC: 4.78 Mil/uL (ref 4.22–5.81)
RDW: 14.7 % (ref 11.5–15.5)
WBC: 9.4 10*3/uL (ref 4.0–10.5)

## 2015-06-12 LAB — HEPATIC FUNCTION PANEL
ALBUMIN: 4.7 g/dL (ref 3.5–5.2)
ALT: 4 U/L (ref 0–53)
AST: 11 U/L (ref 0–37)
Alkaline Phosphatase: 68 U/L (ref 39–117)
Bilirubin, Direct: 0.1 mg/dL (ref 0.0–0.3)
TOTAL PROTEIN: 7.2 g/dL (ref 6.0–8.3)
Total Bilirubin: 0.6 mg/dL (ref 0.2–1.2)

## 2015-06-12 LAB — LIPID PANEL
CHOLESTEROL: 298 mg/dL — AB (ref 0–200)
HDL: 44.2 mg/dL (ref >39.00)
NonHDL: 253.47
TRIGLYCERIDES: 223 mg/dL — AB (ref 0.0–149.0)
Total CHOL/HDL Ratio: 7
VLDL: 44.6 mg/dL — AB (ref 0.0–40.0)

## 2015-06-12 LAB — BASIC METABOLIC PANEL
BUN: 29 mg/dL — AB (ref 6–23)
CHLORIDE: 104 meq/L (ref 96–112)
CO2: 28 mEq/L (ref 19–32)
Calcium: 10.1 mg/dL (ref 8.4–10.5)
Creatinine, Ser: 1.55 mg/dL — ABNORMAL HIGH (ref 0.40–1.50)
GFR: 48.2 mL/min — ABNORMAL LOW (ref >60.00)
GLUCOSE: 98 mg/dL (ref 70–99)
POTASSIUM: 5.1 meq/L (ref 3.5–5.1)
Sodium: 138 mEq/L (ref 135–145)

## 2015-06-12 LAB — TSH: TSH: 1.97 u[IU]/mL (ref 0.35–4.50)

## 2015-06-12 LAB — PSA: PSA: 2.45 ng/mL (ref 0.10–4.00)

## 2015-06-12 LAB — LDL CHOLESTEROL, DIRECT: Direct LDL: 189 mg/dL

## 2015-06-12 NOTE — Progress Notes (Signed)
Subjective:    Patient ID: John Herring, male    DOB: 1950/07/08, 65 y.o.   MRN: [id number]  HPI  Here for wellness and f/u;  Overall doing ok;  Pt denies Chest pain, worsening SOB, DOE, wheezing, orthopnea, PND, worsening LE edema, palpitations, dizziness or syncope.  Pt denies neurological change such as new headache, facial or extremity weakness.  Pt denies polydipsia, polyuria, or low sugar symptoms. Pt states overall good compliance with treatment and medications, good tolerability, and has been trying to follow appropriate diet.  Pt denies worsening depressive symptoms, suicidal ideation or panic. No fever, night sweats, wt loss, loss of appetite, or other constitutional symptoms.  Pt states good ability with ADL's, has low fall risk, home safety reviewed and adequate, no other significant changes in hearing or vision, and only occasionally active with exercise, due to PD and recent finding worseing orthostatic hypotension, seen per neuro and cardiology Past Medical History  Diagnosis Date  . 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 Hypersomnia 05/06/2011  . Hyperlipidemia 05/06/2011  . Skin cancer 05/06/2011  . Depression 05/06/2011  . Memory loss 07/12/2012  . GERD (gastroesophageal reflux disease) 12/02/2012   Past Surgical History  Procedure Laterality Date  . Deep brain stimulation      Parkinson's disease  . Tonsillectomy      As a child  . Subthalamic stimulator battery replacement N/A 09/01/2012    Procedure: SUBTHALAMIC STIMULATOR BATTERY REPLACEMENT;  Surgeon: Erline Levine, MD;  Location: [facility];  Service: Neurosurgery;  Laterality: N/A;  Deep Brain Stimulator battery change    reports that he has never smoked. He has never used smokeless tobacco. He reports that he does not drink alcohol or use illicit drugs. family history is negative for Colon cancer. Allergies  Allergen Reactions  . Statins     Makes parkinson worse    Current Outpatient Prescriptions on File Prior to Visit  Medication Sig Dispense Refill  . amantadine (SYMMETREL) 100 MG capsule Take 100 mg by mouth 2 (two) times daily.    . AZILECT 1 MG TABS Take 1 mg by mouth daily.     . carbidopa-levodopa (SINEMET) 25-100 MG per tablet Take 1 tablet by mouth 6 (six) times daily.     . meloxicam (MOBIC) 15 MG tablet TAKE 1 TABLET BY MOUTH EVERY DAY 90 tablet 1  . meloxicam (MOBIC) 15 MG tablet Take 1 tablet (15 mg total) by mouth daily. 90 tablet 1  . midodrine (PROAMATINE) 10 MG tablet Take 1 tablet (10 mg total) by mouth 3 (three) times daily. 90 tablet 11   No current facility-administered medications on file prior to visit.   Review of Systems Constitutional: Negative for increased diaphoresis, other activity, appetite or siginficant weight change other than noted HENT: Negative for worsening hearing loss, ear pain, facial swelling, mouth sores and neck stiffness.   Eyes: Negative for other worsening pain, redness or visual disturbance.  Respiratory: Negative for shortness of breath and wheezing  Cardiovascular: Negative for chest pain and palpitations.  Gastrointestinal: Negative for diarrhea, blood in stool, abdominal distention or other pain Genitourinary: Negative for hematuria, flank pain or change in urine volume.  Musculoskeletal: Negative for myalgias or other joint complaints.  Skin: Negative for color change and wound or drainage.  Neurological: Negative for syncope and numbness. other than noted Hematological: Negative for adenopathy. or other swelling Psychiatric/Behavioral: Negative for hallucinations, SI, self-injury, decreased concentration or other worsening agitation.  Objective:   Physical Exam BP 130/82 mmHg  Pulse 90  Temp(Src) 98.7 F (37.1 C) (Oral)  Resp 20  Wt 185 lb (83.915 kg)  SpO2 96% VS noted, not ill appearing Constitutional: Pt is oriented to person, place, and time. Appears well-developed and  well-nourished, in no significant distress Head: Normocephalic and atraumatic.  Right Ear: External ear normal.  Left Ear: External ear normal.  Nose: Nose normal.  Mouth/Throat: Oropharynx is clear and moist.  Eyes: Conjunctivae and EOM are normal. Pupils are equal, round, and reactive to light.  Neck: Normal range of motion. Neck supple. No JVD present. No tracheal deviation present or significant neck LA or mass Cardiovascular: Normal rate, regular rhythm, normal heart sounds and intact distal pulses.   Pulmonary/Chest: Effort normal and breath sounds without rales or wheezing  Abdominal: Soft. Bowel sounds are normal. NT. No HSM  Musculoskeletal: Normal range of motion. Exhibits no edema.  Lymphadenopathy:  Has no cervical adenopathy.  Neurological: Pt is alert and oriented to person, place, and time. Pt has normal reflexes. No cranial nerve deficit. Motor grossly intact, + PD tupe involuntary movements Skin: Skin is warm and dry. No rash noted.  Psychiatric:  Has normal mood and affect. Behavior is normal. affect is c/w pseudobulbar     Assessment & Plan:

## 2015-06-12 NOTE — Assessment & Plan Note (Signed)

## 2015-06-12 NOTE — Progress Notes (Signed)
Pre visit review using our clinic review tool, if applicable. No additional management support is needed unless otherwise documented below in the visit note. 

## 2015-06-12 NOTE — Patient Instructions (Signed)
Please continue all other medications as before, and refills have been done if requested.  Please have the pharmacy call with any other refills you may need.  Please continue your efforts at being more active, low cholesterol diet, and weight control.  You are otherwise up to date with prevention measures today.  Please keep your appointments with your specialists as you may have planned  Please go to the LAB in the Basement (turn left off the elevator) for the tests to be done today  You will be contacted by phone if any changes need to be made immediately.  Otherwise, you will receive a letter about your results with an explanation, but please check with MyChart first.  Please return in 6 months, or sooner if needed 

## 2015-06-13 ENCOUNTER — Encounter: Payer: Self-pay | Admitting: Internal Medicine

## 2015-06-13 LAB — URINALYSIS, ROUTINE W REFLEX MICROSCOPIC
Bilirubin Urine: NEGATIVE
KETONES UR: NEGATIVE
LEUKOCYTES UA: NEGATIVE
NITRITE: NEGATIVE
SPECIFIC GRAVITY, URINE: 1.025 (ref 1.000–1.030)
Total Protein, Urine: NEGATIVE
URINE GLUCOSE: NEGATIVE
Urobilinogen, UA: 0.2 (ref 0.0–1.0)
WBC, UA: NONE SEEN (ref 0–?)
pH: 6 (ref 5.0–8.0)

## 2015-06-13 LAB — HEPATITIS C ANTIBODY: HCV AB: NEGATIVE

## 2015-07-18 ENCOUNTER — Other Ambulatory Visit: Payer: Self-pay | Admitting: Neurosurgery

## 2015-07-18 ENCOUNTER — Encounter (HOSPITAL_COMMUNITY): Payer: Self-pay | Admitting: *Deleted

## 2015-07-18 NOTE — Progress Notes (Signed)
   07/18/15 1839  OBSTRUCTIVE SLEEP APNEA  Have you ever been diagnosed with sleep apnea through a sleep study? No  Do you snore loudly (loud enough to be heard through closed doors)?  1  Do you often feel tired, fatigued, or sleepy during the daytime (such as falling asleep during driving or talking to someone)? 1  Has anyone observed you stop breathing during your sleep? 0  Do you have, or are you being treated for high blood pressure? 0  BMI more than 35 kg/m2? 0  Age > 50 (1-yes) 1  Neck circumference greater than:Male 16 inches or larger, Male 17inches or larger? 1  Male Gender (Yes=1) 1  Obstructive Sleep Apnea Score 5

## 2015-07-18 NOTE — Progress Notes (Signed)
Pt requested that I speak with his wife for pre-op call. She states pt does not have a cardiac history, denies any recent chest pain or sob complaint from pt. Pt does have Parkinson's Disease, hypotension and syncope.   I spoke with Dr. Oletta Lamas (anesthesiologist) because pt is on Azilect (MAO inhibitor) and Amantadine. She states that pt will be evaluated day of surgery but asked that pt not take Azilect or Amantadine in the AM, but to bring them with him in case his surgery gets cancelled. She also requested that I let Dr. Vertell Limber know that the MAO inhibitor may cause the surgery to be cancelled. It is after office hours and Dr. Vertell Limber is not on call. Will notify him in the AM.  Pt's wife was instructed for pt not to take his Azilect and Amantadine in the AM, but to bring them with pt. She voiced understanding.

## 2015-07-19 ENCOUNTER — Ambulatory Visit (HOSPITAL_COMMUNITY)
Admission: RE | Admit: 2015-07-19 | Discharge: 2015-07-19 | Disposition: A | Discharge status: Home / Self Care | Payer: BC Managed Care – PPO | Source: Ambulatory Visit | Attending: Neurosurgery | Admitting: Neurosurgery

## 2015-07-19 ENCOUNTER — Ambulatory Visit (HOSPITAL_COMMUNITY): Payer: BC Managed Care – PPO | Admitting: Certified Registered Nurse Anesthetist

## 2015-07-19 ENCOUNTER — Encounter (HOSPITAL_COMMUNITY): Payer: Self-pay | Admitting: Certified Registered Nurse Anesthetist

## 2015-07-19 ENCOUNTER — Encounter (HOSPITAL_COMMUNITY)
Admission: RE | Disposition: A | Discharge status: Home / Self Care | Payer: Self-pay | Source: Ambulatory Visit | Attending: Neurosurgery

## 2015-07-19 DIAGNOSIS — G20 Parkinson's disease: Secondary | ICD-10-CM | POA: Diagnosis present

## 2015-07-19 DIAGNOSIS — K219 Gastro-esophageal reflux disease without esophagitis: Secondary | ICD-10-CM | POA: Insufficient documentation

## 2015-07-19 DIAGNOSIS — Z85828 Personal history of other malignant neoplasm of skin: Secondary | ICD-10-CM | POA: Insufficient documentation

## 2015-07-19 DIAGNOSIS — E785 Hyperlipidemia, unspecified: Secondary | ICD-10-CM | POA: Diagnosis not present

## 2015-07-19 DIAGNOSIS — G20A1 Parkinson's disease without dyskinesia, without mention of fluctuations: Secondary | ICD-10-CM | POA: Diagnosis present

## 2015-07-19 DIAGNOSIS — F329 Major depressive disorder, single episode, unspecified: Secondary | ICD-10-CM | POA: Insufficient documentation

## 2015-07-19 HISTORY — PX: SUBTHALAMIC STIMULATOR BATTERY REPLACEMENT: SHX5405

## 2015-07-19 HISTORY — DX: Unspecified osteoarthritis, unspecified site: M19.90

## 2015-07-19 HISTORY — DX: Benign prostatic hyperplasia without lower urinary tract symptoms: N40.0

## 2015-07-19 HISTORY — DX: Calculus of kidney: N20.0

## 2015-07-19 HISTORY — DX: Hypotension, unspecified: I95.9

## 2015-07-19 HISTORY — DX: Syncope and collapse: R55

## 2015-07-19 LAB — BASIC METABOLIC PANEL
ANION GAP: 10 (ref 5–15)
BUN: 35 mg/dL — ABNORMAL HIGH (ref 6–20)
CALCIUM: 9.7 mg/dL (ref 8.9–10.3)
CO2: 21 mmol/L — ABNORMAL LOW (ref 22–32)
Chloride: 108 mmol/L (ref 101–111)
Creatinine, Ser: 1.72 mg/dL — ABNORMAL HIGH (ref 0.61–1.24)
GFR, EST AFRICAN AMERICAN: 47 mL/min — AB (ref >60)
GFR, EST NON AFRICAN AMERICAN: 40 mL/min — AB (ref >60)
Glucose, Bld: 100 mg/dL — ABNORMAL HIGH (ref 65–99)
Potassium: 5 mmol/L (ref 3.5–5.1)
Sodium: 139 mmol/L (ref 135–145)

## 2015-07-19 LAB — CBC
HCT: 42.9 % (ref 39.0–52.0)
Hemoglobin: 14.4 g/dL (ref 13.0–17.0)
MCH: 30.6 pg (ref 26.0–34.0)
MCHC: 33.6 g/dL (ref 30.0–36.0)
MCV: 91.3 fL (ref 78.0–100.0)
PLATELETS: 196 10*3/uL (ref 150–400)
RBC: 4.7 MIL/uL (ref 4.22–5.81)
RDW: 14.3 % (ref 11.5–15.5)
WBC: 8.7 10*3/uL (ref 4.0–10.5)

## 2015-07-19 SURGERY — SUBTHALAMIC STIMULATOR BATTERY REPLACEMENT
Anesthesia: General

## 2015-07-19 MED ORDER — PANTOPRAZOLE SODIUM 40 MG IV SOLR: 40.0000 mg | Freq: Every day | INTRAVENOUS | Status: DC

## 2015-07-19 MED ORDER — MENTHOL 3 MG MT LOZG: 1.0000 | LOZENGE | OROMUCOSAL | Status: DC | PRN

## 2015-07-19 MED ORDER — CEFAZOLIN SODIUM-DEXTROSE 2-4 GM/100ML-% IV SOLN: 2.0000 g | Freq: Three times a day (TID) | INTRAVENOUS | Status: DC

## 2015-07-19 MED ORDER — ROCURONIUM BROMIDE 50 MG/5ML IV SOLN
INTRAVENOUS | Status: AC
  Filled 2015-07-19: qty 1

## 2015-07-19 MED ORDER — METHOCARBAMOL 500 MG PO TABS: 500.0000 mg | ORAL_TABLET | Freq: Four times a day (QID) | ORAL | Status: DC | PRN

## 2015-07-19 MED ORDER — MIDAZOLAM HCL 5 MG/5ML IJ SOLN
INTRAMUSCULAR | Status: DC | PRN
  Administered 2015-07-19 (×2): .5 mg via INTRAVENOUS

## 2015-07-19 MED ORDER — MIDAZOLAM HCL 2 MG/2ML IJ SOLN
INTRAMUSCULAR | Status: AC
  Filled 2015-07-19: qty 2

## 2015-07-19 MED ORDER — HYDROCODONE-ACETAMINOPHEN 5-325 MG PO TABS
1.0000 | ORAL_TABLET | ORAL | Status: DC | PRN
  Administered 2015-07-19: 2 via ORAL
  Filled 2015-07-19: qty 2

## 2015-07-19 MED ORDER — MELOXICAM 15 MG PO TABS
15.0000 mg | ORAL_TABLET | Freq: Every day | ORAL | Status: DC
  Filled 2015-07-19: qty 1

## 2015-07-19 MED ORDER — PHENYLEPHRINE 40 MCG/ML (10ML) SYRINGE FOR IV PUSH (FOR BLOOD PRESSURE SUPPORT)
PREFILLED_SYRINGE | INTRAVENOUS | Status: AC
  Filled 2015-07-19: qty 10

## 2015-07-19 MED ORDER — OXYCODONE-ACETAMINOPHEN 5-325 MG PO TABS: 1.0000 | ORAL_TABLET | ORAL | Status: DC | PRN

## 2015-07-19 MED ORDER — FENTANYL CITRATE (PF) 250 MCG/5ML IJ SOLN
INTRAMUSCULAR | Status: AC
  Filled 2015-07-19: qty 5

## 2015-07-19 MED ORDER — ONDANSETRON HCL 4 MG/2ML IJ SOLN
INTRAMUSCULAR | Status: AC
  Filled 2015-07-19: qty 2

## 2015-07-19 MED ORDER — SERTRALINE HCL 50 MG PO TABS: 50.0000 mg | ORAL_TABLET | Freq: Every day | ORAL | Status: DC

## 2015-07-19 MED ORDER — FENTANYL CITRATE (PF) 100 MCG/2ML IJ SOLN
25.0000 ug | INTRAMUSCULAR | Status: DC | PRN
Start: 2015-07-19 — End: 2015-07-19

## 2015-07-19 MED ORDER — SODIUM CHLORIDE 0.9% FLUSH: 3.0000 mL | Freq: Two times a day (BID) | INTRAVENOUS | Status: DC

## 2015-07-19 MED ORDER — HYDROMORPHONE HCL 1 MG/ML IJ SOLN: 0.5000 mg | INTRAMUSCULAR | Status: DC | PRN

## 2015-07-19 MED ORDER — MIDODRINE HCL 5 MG PO TABS
10.0000 mg | ORAL_TABLET | Freq: Three times a day (TID) | ORAL | Status: DC
Start: 2015-07-20 — End: 2015-07-19
  Filled 2015-07-19: qty 2

## 2015-07-19 MED ORDER — LIDOCAINE-EPINEPHRINE 1 %-1:100000 IJ SOLN
INTRAMUSCULAR | Status: DC | PRN
  Administered 2015-07-19: 20 mL

## 2015-07-19 MED ORDER — LIDOCAINE HCL (CARDIAC) 20 MG/ML IV SOLN
INTRAVENOUS | Status: AC
  Filled 2015-07-19: qty 5

## 2015-07-19 MED ORDER — SODIUM CHLORIDE 0.9 % IV SOLN: 250.0000 mL | INTRAVENOUS | Status: DC

## 2015-07-19 MED ORDER — FENTANYL CITRATE (PF) 100 MCG/2ML IJ SOLN
INTRAMUSCULAR | Status: DC | PRN
  Administered 2015-07-19: 25 ug via INTRAVENOUS

## 2015-07-19 MED ORDER — ONDANSETRON HCL 4 MG/2ML IJ SOLN
INTRAMUSCULAR | Status: DC | PRN
  Administered 2015-07-19: 4 mg via INTRAVENOUS

## 2015-07-19 MED ORDER — OXYMETAZOLINE HCL 0.05 % NA SOLN: 1.0000 | Freq: Every day | NASAL | Status: DC | PRN

## 2015-07-19 MED ORDER — LACTATED RINGERS IV SOLN
INTRAVENOUS | Status: DC
  Administered 2015-07-19: 13:00:00 via INTRAVENOUS

## 2015-07-19 MED ORDER — ONDANSETRON HCL 4 MG/2ML IJ SOLN: 4.0000 mg | INTRAMUSCULAR | Status: DC | PRN

## 2015-07-19 MED ORDER — RASAGILINE MESYLATE 1 MG PO TABS
1.0000 mg | ORAL_TABLET | Freq: Every day | ORAL | Status: DC
Start: 2015-07-19 — End: 2015-07-19
  Filled 2015-07-19: qty 1

## 2015-07-19 MED ORDER — PROPOFOL 10 MG/ML IV BOLUS
INTRAVENOUS | Status: DC | PRN
  Administered 2015-07-19: 150 mg via INTRAVENOUS
  Administered 2015-07-19 (×2): 10 mg via INTRAVENOUS

## 2015-07-19 MED ORDER — ONDANSETRON HCL 4 MG/2ML IJ SOLN
4.0000 mg | Freq: Once | INTRAMUSCULAR | Status: DC
Start: 2015-07-19 — End: 2015-07-19

## 2015-07-19 MED ORDER — BUPIVACAINE HCL (PF) 0.5 % IJ SOLN
INTRAMUSCULAR | Status: DC | PRN
  Administered 2015-07-19: 20 mL

## 2015-07-19 MED ORDER — VANCOMYCIN HCL 1000 MG IV SOLR
INTRAVENOUS | Status: AC
  Filled 2015-07-19: qty 1000

## 2015-07-19 MED ORDER — DOCUSATE SODIUM 100 MG PO CAPS: 100.0000 mg | ORAL_CAPSULE | Freq: Two times a day (BID) | ORAL | Status: DC

## 2015-07-19 MED ORDER — KCL IN DEXTROSE-NACL 20-5-0.45 MEQ/L-%-% IV SOLN: INTRAVENOUS | Status: DC

## 2015-07-19 MED ORDER — AMANTADINE HCL 100 MG PO CAPS
100.0000 mg | ORAL_CAPSULE | Freq: Three times a day (TID) | ORAL | Status: DC
  Filled 2015-07-19: qty 1

## 2015-07-19 MED ORDER — LIDOCAINE HCL (CARDIAC) 20 MG/ML IV SOLN
INTRAVENOUS | Status: DC | PRN
  Administered 2015-07-19: 100 mg via INTRAVENOUS

## 2015-07-19 MED ORDER — VANCOMYCIN HCL IN DEXTROSE 1-5 GM/200ML-% IV SOLN
INTRAVENOUS | Status: AC
  Administered 2015-07-19: 1000 mg via INTRAVENOUS
  Filled 2015-07-19: qty 200

## 2015-07-19 MED ORDER — VANCOMYCIN HCL 1000 MG IV SOLR
INTRAVENOUS | Status: DC | PRN
  Administered 2015-07-19: 1000 mg

## 2015-07-19 MED ORDER — CARBIDOPA-LEVODOPA 25-100 MG PO TABS
1.0000 | ORAL_TABLET | Freq: Every day | ORAL | Status: DC
  Filled 2015-07-19 (×2): qty 1

## 2015-07-19 MED ORDER — CEFAZOLIN SODIUM-DEXTROSE 2-4 GM/100ML-% IV SOLN
2.0000 g | INTRAVENOUS | Status: AC
  Administered 2015-07-19: 2 g via INTRAVENOUS
  Filled 2015-07-19: qty 100

## 2015-07-19 MED ORDER — PHENOL 1.4 % MT LIQD: 1.0000 | OROMUCOSAL | Status: DC | PRN

## 2015-07-19 MED ORDER — ACETAMINOPHEN 325 MG PO TABS
650.0000 mg | ORAL_TABLET | ORAL | Status: DC | PRN
  Filled 2015-07-19: qty 2

## 2015-07-19 MED ORDER — IBUPROFEN 200 MG PO TABS
800.0000 mg | ORAL_TABLET | Freq: Three times a day (TID) | ORAL | Status: DC | PRN
Start: 2015-07-19 — End: 2015-07-19

## 2015-07-19 MED ORDER — ACETAMINOPHEN 650 MG RE SUPP: 650.0000 mg | RECTAL | Status: DC | PRN

## 2015-07-19 MED ORDER — SODIUM CHLORIDE 0.9% FLUSH
3.0000 mL | INTRAVENOUS | Status: DC | PRN
Start: 2015-07-19 — End: 2015-07-19

## 2015-07-19 MED ORDER — 0.9 % SODIUM CHLORIDE (POUR BTL) OPTIME
TOPICAL | Status: DC | PRN
  Administered 2015-07-19: 1000 mL

## 2015-07-19 MED ORDER — PROPOFOL 10 MG/ML IV BOLUS
INTRAVENOUS | Status: AC
  Filled 2015-07-19: qty 20

## 2015-07-19 MED ORDER — MEPERIDINE HCL 25 MG/ML IJ SOLN: 6.2500 mg | INTRAMUSCULAR | Status: DC | PRN

## 2015-07-19 MED ORDER — METHOCARBAMOL 1000 MG/10ML IJ SOLN
500.0000 mg | Freq: Four times a day (QID) | INTRAVENOUS | Status: DC | PRN
  Filled 2015-07-19: qty 5

## 2015-07-19 MED ORDER — ALUM & MAG HYDROXIDE-SIMETH 200-200-20 MG/5ML PO SUSP: 30.0000 mL | Freq: Four times a day (QID) | ORAL | Status: DC | PRN

## 2015-07-19 SURGICAL SUPPLY — 39 items
ADH SKN CLS APL DERMABOND .7 (GAUZE/BANDAGES/DRESSINGS) ×1
CANISTER SUCT 3000ML PPV (MISCELLANEOUS) ×3 IMPLANT
DECANTER SPIKE VIAL GLASS SM (MISCELLANEOUS) ×3 IMPLANT
DERMABOND ADVANCED (GAUZE/BANDAGES/DRESSINGS) ×2
DERMABOND ADVANCED .7 DNX12 (GAUZE/BANDAGES/DRESSINGS) ×1 IMPLANT
DRAPE CAMERA VIDEO/LASER (DRAPES) ×2 IMPLANT
DRAPE LAPAROTOMY 100X72 PEDS (DRAPES) ×3 IMPLANT
DRAPE POUCH INSTRU U-SHP 10X18 (DRAPES) ×3 IMPLANT
DRSG OPSITE POSTOP 4X6 (GAUZE/BANDAGES/DRESSINGS) ×2 IMPLANT
DURAPREP 26ML APPLICATOR (WOUND CARE) ×3 IMPLANT
GAUZE SPONGE 4X4 16PLY XRAY LF (GAUZE/BANDAGES/DRESSINGS) IMPLANT
GLOVE BIO SURGEON STRL SZ8 (GLOVE) ×3 IMPLANT
GLOVE BIOGEL PI IND STRL 8 (GLOVE) ×1 IMPLANT
GLOVE BIOGEL PI IND STRL 8.5 (GLOVE) ×1 IMPLANT
GLOVE BIOGEL PI INDICATOR 8 (GLOVE) ×2
GLOVE BIOGEL PI INDICATOR 8.5 (GLOVE) ×2
GLOVE ECLIPSE 8.0 STRL XLNG CF (GLOVE) ×3 IMPLANT
GLOVE EXAM NITRILE LRG STRL (GLOVE) IMPLANT
GLOVE EXAM NITRILE MD LF STRL (GLOVE) IMPLANT
GLOVE EXAM NITRILE XL STR (GLOVE) IMPLANT
GLOVE EXAM NITRILE XS STR PU (GLOVE) IMPLANT
GOWN STRL REUS W/ TWL LRG LVL3 (GOWN DISPOSABLE) IMPLANT
GOWN STRL REUS W/ TWL XL LVL3 (GOWN DISPOSABLE) ×1 IMPLANT
GOWN STRL REUS W/TWL 2XL LVL3 (GOWN DISPOSABLE) ×3 IMPLANT
GOWN STRL REUS W/TWL LRG LVL3 (GOWN DISPOSABLE)
GOWN STRL REUS W/TWL XL LVL3 (GOWN DISPOSABLE) ×3
KIT BASIN OR (CUSTOM PROCEDURE TRAY) ×3 IMPLANT
KIT ROOM TURNOVER OR (KITS) ×3 IMPLANT
NDL HYPO 25X1 1.5 SAFETY (NEEDLE) ×1 IMPLANT
NEEDLE HYPO 25X1 1.5 SAFETY (NEEDLE) ×3 IMPLANT
NEUROSTIM OCTOPOLAR ~~LOC~~ 49X65 (Neuro Prosthesis/Implant) ×2 IMPLANT
NS IRRIG 1000ML POUR BTL (IV SOLUTION) ×3 IMPLANT
PACK LAMINECTOMY NEURO (CUSTOM PROCEDURE TRAY) ×3 IMPLANT
PAD ARMBOARD 7.5X6 YLW CONV (MISCELLANEOUS) ×9 IMPLANT
SUT VIC AB 2-0 CP2 18 (SUTURE) ×5 IMPLANT
SUT VIC AB 3-0 SH 8-18 (SUTURE) ×3 IMPLANT
TOWEL OR 17X24 6PK STRL BLUE (TOWEL DISPOSABLE) ×3 IMPLANT
TOWEL OR 17X26 10 PK STRL BLUE (TOWEL DISPOSABLE) ×3 IMPLANT
WATER STERILE IRR 1000ML POUR (IV SOLUTION) ×3 IMPLANT

## 2015-07-19 NOTE — Anesthesia Preprocedure Evaluation (Addendum)
Anesthesia Evaluation  Patient identified by MRN, date of birth, ID band Patient awake    Reviewed: Allergy & Precautions, H&P , NPO status , Patient's Chart, lab work & pertinent test results, reviewed documented beta blocker date and time   Airway Mallampati: I  TM Distance: >3 FB Neck ROM: Full    Dental no notable dental hx. (+) Teeth Intact, Dental Advisory Given   Pulmonary sleep apnea ,  Does not use CPAP   Pulmonary exam normal breath sounds clear to auscultation       Cardiovascular negative cardio ROS Normal cardiovascular exam Rhythm:Regular Rate:Normal     Neuro/Psych H/O Parkinsonism negative psych ROS   GI/Hepatic negative GI ROS, Neg liver ROS,   Endo/Other  negative endocrine ROS  Renal/GU negative Renal ROS  negative genitourinary   Musculoskeletal negative musculoskeletal ROS (+)   Abdominal   Peds negative pediatric ROS (+)  Hematology negative hematology ROS (+)   Anesthesia Other Findings   Reproductive/Obstetrics negative OB ROS                            Anesthesia Physical  Anesthesia Plan  ASA: III  Anesthesia Plan: General   Post-op Pain Management:    Induction: Intravenous  Airway Management Planned: LMA  Additional Equipment:   Intra-op Plan:   Post-operative Plan: Extubation in OR  Informed Consent: I have reviewed the patients History and Physical, chart, labs and discussed the procedure including the risks, benefits and alternatives for the proposed anesthesia with the patient or authorized representative who has indicated his/her understanding and acceptance.   Dental advisory given  Plan Discussed with: CRNA, Surgeon and Anesthesiologist  Anesthesia Plan Comments:        Anesthesia Quick Evaluation

## 2015-07-19 NOTE — Progress Notes (Signed)
Discharged instructions/education/Rx given to patient with wife at bedside and they both verbalized understanding. Patient tolerated his dinner well, patient voiding with no difficulty, and patient ambulated in the hallway with no issues. Pain is low to moderate. Patient discharged via wheelchair.

## 2015-07-19 NOTE — Brief Op Note (Signed)
07/19/2015  4:43 PM  PATIENT:  John Herring  65 y.o. male  PRE-OPERATIVE DIAGNOSIS:  Parkinsons Disease with depleted implanted pulse generator for bilateral STN DBS leads  POST-OPERATIVE DIAGNOSIS: Parkinsons Disease with depleted implanted pulse generator for bilateral STN DBS leads  PROCEDURE:  Procedure(s) with comments: Deep Brain stimulator battery replacement (N/A) - Deep Brain stimulator battery replacement  SURGEON:  Surgeon(s) and Role:    * Erline Levine, MD - Primary  PHYSICIAN ASSISTANT:   ASSISTANTS: Poteat, RN   ANESTHESIA:   general  EBL:  Total I/O In: 600 [I.V.:600] Out: 1 [Blood:1]  BLOOD ADMINISTERED:none  DRAINS: none   LOCAL MEDICATIONS USED:  MARCAINE    and LIDOCAINE   SPECIMEN:  No Specimen  DISPOSITION OF SPECIMEN:  N/A  COUNTS:  YES  TOURNIQUET:  * No tourniquets in log *  DICTATION: DICTATION: Patient has implanted subthalamic stimulator electrodes and IPG, which is now depleted.  It was elected for patient to undergo IPG revision.  PROCEDURE: Patient was brought to the operating room and given intravenous sedation.  Right upper chest was prepped with betadine scrub and Duraprep.  Area of planned incision was infiltrated with lidocaine.  Prior incision was reopened and the old IPG was externalized.  Adaptor was connected to new IPG which was placed in the pocket.  Impedances were checked and found to be within range.  Wound was irrigated with vancomycin. Then irrigated once more.  Incision was closed with 2-0 Vicryl and 3-0 vicryl sutures and dressed with a sterile occlusive dressing.  Counts were correct at the end of the case.  PLAN OF CARE: Admit for overnight observation  PATIENT DISPOSITION:  PACU - hemodynamically stable.   Delay start of Pharmacological VTE agent (>24hrs) due to surgical blood loss or risk of bleeding: yes

## 2015-07-19 NOTE — Transfer of Care (Signed)
Immediate Anesthesia Transfer of Care Note  Patient: John Herring  Procedure(s) Performed: Procedure(s) with comments: Deep Brain stimulator battery replacement (N/A) - Deep Brain stimulator battery replacement  Patient Location: PACU  Anesthesia Type:General  Level of Consciousness: awake, alert  and oriented  Airway & Oxygen Therapy: Patient Spontanous Breathing  Post-op Assessment: Report given to RN and Post -op Vital signs reviewed and stable  Post vital signs: Reviewed and stable  Last Vitals:  Filed Vitals:   07/19/15 1206  BP: 138/86  Pulse: 88  Temp: 36.6 C  Resp: 18    Complications: No apparent anesthesia complications

## 2015-07-19 NOTE — Op Note (Signed)
07/19/2015  4:43 PM  PATIENT:  John Herring  65 y.o. male  PRE-OPERATIVE DIAGNOSIS:  Parkinsons Disease with depleted implanted pulse generator for bilateral STN DBS leads  POST-OPERATIVE DIAGNOSIS: Parkinsons Disease with depleted implanted pulse generator for bilateral STN DBS leads  PROCEDURE:  Procedure(s) with comments: Deep Brain stimulator battery replacement (N/A) - Deep Brain stimulator battery replacement  SURGEON:  Surgeon(s) and Role:    * Erline Levine, MD - Primary  PHYSICIAN ASSISTANT:   ASSISTANTS: Poteat, RN   ANESTHESIA:   general  EBL:  Total I/O In: 600 [I.V.:600] Out: 1 [Blood:1]  BLOOD ADMINISTERED:none  DRAINS: none   LOCAL MEDICATIONS USED:  MARCAINE    and LIDOCAINE   SPECIMEN:  No Specimen  DISPOSITION OF SPECIMEN:  N/A  COUNTS:  YES  TOURNIQUET:  * No tourniquets in log *  DICTATION: DICTATION: Patient has implanted subthalamic stimulator electrodes and IPG, which is now depleted.  It was elected for patient to undergo IPG revision.  PROCEDURE: Patient was brought to the operating room and given intravenous sedation.  Right upper chest was prepped with betadine scrub and Duraprep.  Area of planned incision was infiltrated with lidocaine.  Prior incision was reopened and the old IPG was externalized.  Adaptor was connected to new IPG which was placed in the pocket.  Impedances were checked and found to be within range.  Wound was irrigated with vancomycin. Then irrigated once more.  Incision was closed with 2-0 Vicryl and 3-0 vicryl sutures and dressed with a sterile occlusive dressing.  Counts were correct at the end of the case.  PLAN OF CARE: Admit for overnight observation  PATIENT DISPOSITION:  PACU - hemodynamically stable.   Delay start of Pharmacological VTE agent (>24hrs) due to surgical blood loss or risk of bleeding: yes

## 2015-07-19 NOTE — H&P (Signed)
John Herring is an 65 y.o. male.   Chief Complaint: Depleted IPG for bilateral DBS for Parkinson's HPI: Patient has PC IPG for Parkinson's for bilateral STN with depleted IPG at 2.59 Volts.  He presents for IPG revision.  Past Medical History  Diagnosis Date  . 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 Hypersomnia 05/06/2011  . Hyperlipidemia 05/06/2011  . Skin cancer 05/06/2011  . Depression 05/06/2011  . Memory loss 07/12/2012  . GERD (gastroesophageal reflux disease) 12/02/2012  . Hypotension   . Kidney stones   . Enlarged prostate   . Syncope   . Arthritis     Past Surgical History  Procedure Laterality Date  . Deep brain stimulation      Parkinson's disease  . Tonsillectomy      As a child  . Subthalamic stimulator battery replacement N/A 09/01/2012    Procedure: SUBTHALAMIC STIMULATOR BATTERY REPLACEMENT;  Surgeon: Erline Levine, MD;  Location: [facility];  Service: Neurosurgery;  Laterality: N/A;  Deep Brain Stimulator battery change    Family History  Problem Relation Age of Onset  . Colon cancer Neg Hx    Social History:  reports that he has never smoked. He has never used smokeless tobacco. He reports that he does not drink alcohol or use illicit drugs.  Allergies:  Allergies  Allergen Reactions  . Statins     Makes parkinson worse    Medications Prior to Admission  Medication Sig Dispense Refill  . amantadine (SYMMETREL) 100 MG capsule Take 100 mg by mouth 3 (three) times daily.     . AZILECT 1 MG TABS Take 1 mg by mouth daily.     . carbidopa-levodopa (SINEMET) 25-100 MG per tablet Take 1 tablet by mouth 6 (six) times daily.     . meloxicam (MOBIC) 15 MG tablet Take 1 tablet (15 mg total) by mouth daily. 90 tablet 1  . midodrine (PROAMATINE) 10 MG tablet Take 1 tablet (10 mg total) by mouth 3 (three) times daily. 90 tablet 11  . sertraline (ZOLOFT) 50 MG tablet Take 50 mg by mouth daily.    Marland Kitchen ibuprofen  (ADVIL,MOTRIN) 800 MG tablet Take 800 mg by mouth every 8 (eight) hours as needed for moderate pain.     Marland Kitchen oxymetazoline (AFRIN) 0.05 % nasal spray Place 1 spray into both nostrils daily as needed for congestion.      Results for orders placed or performed during the hospital encounter of 07/19/15 (from the past 48 hour(s))  CBC     Status: None   Collection Time: 07/19/15  1:15 PM  Result Value Ref Range   WBC 8.7 4.0 - 10.5 K/uL   RBC 4.70 4.22 - 5.81 MIL/uL   Hemoglobin 14.4 13.0 - 17.0 g/dL   HCT 42.9 39.0 - 52.0 %   MCV 91.3 78.0 - 100.0 fL   MCH 30.6 26.0 - 34.0 pg   MCHC 33.6 30.0 - 36.0 g/dL   RDW 14.3 11.5 - 15.5 %   Platelets 196 150 - 400 K/uL  Basic metabolic panel     Status: Abnormal   Collection Time: 07/19/15  1:15 PM  Result Value Ref Range   Sodium 139 135 - 145 mmol/L   Potassium 5.0 3.5 - 5.1 mmol/L    Comment: SLIGHT HEMOLYSIS   Chloride 108 101 - 111 mmol/L   CO2 21 (L) 22 - 32 mmol/L   Glucose, Bld 100 (H) 65 - 99 mg/dL  BUN 35 (H) 6 - 20 mg/dL   Creatinine, Ser 1.72 (H) 0.61 - 1.24 mg/dL   Calcium 9.7 8.9 - 10.3 mg/dL   GFR calc non Af Amer 40 (L) >60 mL/min   GFR calc Af Amer 47 (L) >60 mL/min    Comment: (NOTE) The eGFR has been calculated using the CKD EPI equation. This calculation has not been validated in all clinical situations. eGFR's persistently <60 mL/min signify possible Chronic Kidney Disease.    Anion gap 10 5 - 15   No results found.  Review of Systems  Constitutional: Negative.   HENT: Negative.   Eyes: Negative.   Respiratory: Negative.   Cardiovascular: Negative.   Gastrointestinal: Negative.   Genitourinary: Negative.   Musculoskeletal: Negative.   Skin: Negative.   Neurological: Negative.   Endo/Heme/Allergies: Negative.   Psychiatric/Behavioral: Negative.     Blood pressure 138/86, pulse 88, temperature 97.8 F (36.6 C), temperature source Oral, resp. rate 18, height 5' 11" (1.803 m), weight 84.369 kg (186 lb),  SpO2 95 %. Physical Exam  Constitutional: He is oriented to person, place, and time. He appears well-developed and well-nourished.  HENT:  Head: Normocephalic and atraumatic.  Eyes: Conjunctivae are normal. Pupils are equal, round, and reactive to light.  Neck: Normal range of motion. Neck supple.  Cardiovascular: Normal rate and regular rhythm.   Respiratory: Effort normal and breath sounds normal.  GI: Soft. Bowel sounds are normal.  Musculoskeletal: Normal range of motion.  Neurological: He is alert and oriented to person, place, and time. He has normal reflexes. He displays tremor. No cranial nerve deficit. He exhibits abnormal muscle tone. GCS eye subscore is 4. GCS verbal subscore is 5. GCS motor subscore is 6.  Athetoid movements and dyskinesias due to Parkinson's Disease  Skin: Skin is warm and dry.  Psychiatric: He has a normal mood and affect. His behavior is normal. Judgment and thought content normal.     Assessment/Plan Patient presents for depleted IPG revision for Bilateral STN DBS for Parkinson's Disease.  Peggyann Shoals, MD 07/19/2015, 3:05 PM

## 2015-07-19 NOTE — Interval H&P Note (Signed)
History and Physical Interval Note:  07/19/2015 3:12 PM  John Herring  has presented today for surgery, with the diagnosis of Parkinsons  The various methods of treatment have been discussed with the patient and family. After consideration of risks, benefits and other options for treatment, the patient has consented to  Procedure(s) with comments: Deep Brain stimulator battery replacement (N/A) - Deep Brain stimulator battery replacement as a surgical intervention .  The patient's history has been reviewed, patient examined, no change in status, stable for surgery.  I have reviewed the patient's chart and labs.  Questions were answered to the patient's satisfaction.     Shanie Mauzy D

## 2015-07-19 NOTE — Discharge Summary (Signed)
Physician Discharge Summary  Patient ID: John Herring MRN: [id number] DOB/AGE: 64/02/1951 64 y.o.  Admit date: 07/19/2015 Discharge date: 07/19/2015  Admission Diagnoses:Depleted implanted pulse generator for Parkinson's Disease  Discharge Diagnoses: Same Active Problems:   Parkinson's disease [facility])   Discharged Condition: good  Hospital Course: Patient underwent uncomplicated revision of IPG for Parkinson's  Consults: None  Significant Diagnostic Studies: None  Treatments: surgery:uncomplicated revision of IPG for Parkinson's  Discharge Exam: Blood pressure 147/80, pulse 85, temperature 97.6 F (36.4 C), temperature source Oral, resp. rate 10, height 5\' 11"  (1.803 m), weight 84.369 kg (186 lb), SpO2 96 %. Neurologic: Alert and oriented X 3, normal strength and tone. Normal symmetric reflexes. Normal coordination and gait Wound:CDI  Disposition: Home     Medication List    TAKE these medications        amantadine 100 MG capsule  Commonly known as:  SYMMETREL  Take 100 mg by mouth 3 (three) times daily.     AZILECT 1 MG Tabs tablet  Generic drug:  rasagiline  Take 1 mg by mouth daily.     carbidopa-levodopa 25-100 MG tablet  Commonly known as:  SINEMET IR  Take 1 tablet by mouth 6 (six) times daily.     ibuprofen 800 MG tablet  Commonly known as:  ADVIL,MOTRIN  Take 800 mg by mouth every 8 (eight) hours as needed for moderate pain.     meloxicam 15 MG tablet  Commonly known as:  MOBIC  Take 1 tablet (15 mg total) by mouth daily.     midodrine 10 MG tablet  Commonly known as:  PROAMATINE  Take 1 tablet (10 mg total) by mouth 3 (three) times daily.     oxymetazoline 0.05 % nasal spray  Commonly known as:  AFRIN  Place 1 spray into both nostrils daily as needed for congestion.     sertraline 50 MG tablet  Commonly known as:  ZOLOFT  Take 50 mg by mouth daily.         Signed: Peggyann Shoals, MD 07/19/2015, 4:53 PM

## 2015-07-19 NOTE — Progress Notes (Signed)
Awake, alert, conversant.  MAEW.  Dyskinesias improved.  Doing well.

## 2015-07-19 NOTE — Anesthesia Procedure Notes (Signed)
Procedure Name: LMA Insertion Date/Time: 07/19/2015 3:38 PM Performed by: Merdis Delay Pre-anesthesia Checklist: Patient identified, Timeout performed, Emergency Drugs available, Patient being monitored and Suction available Patient Re-evaluated:Patient Re-evaluated prior to inductionOxygen Delivery Method: Circle system utilized Preoxygenation: Pre-oxygenation with 100% oxygen Intubation Type: IV induction LMA: LMA inserted LMA Size: 5.0 Number of attempts: 1 Placement Confirmation: positive ETCO2,  CO2 detector and breath sounds checked- equal and bilateral Tube secured with: Tape Dental Injury: Teeth and Oropharynx as per pre-operative assessment

## 2015-07-20 NOTE — Anesthesia Postprocedure Evaluation (Signed)
Anesthesia Post Note  Patient: John Herring  Procedure(s) Performed: Procedure(s) (LRB): Deep Brain stimulator battery replacement (N/A)  Patient location during evaluation: PACU Anesthesia Type: General Level of consciousness: awake and alert Pain management: pain level controlled Vital Signs Assessment: post-procedure vital signs reviewed and stable Respiratory status: spontaneous breathing, nonlabored ventilation, respiratory function stable and patient connected to nasal cannula oxygen Cardiovascular status: blood pressure returned to baseline and stable Postop Assessment: no signs of nausea or vomiting Anesthetic complications: no    Last Vitals:  Filed Vitals:   07/19/15 1738 07/19/15 2004  BP: 148/91 107/58  Pulse: 83 89  Temp: 36.8 C 36.7 C  Resp: 16 18    Last Pain:  Filed Vitals:   07/19/15 2140  PainSc: 2                  Montez Hageman

## 2015-07-23 ENCOUNTER — Encounter (HOSPITAL_COMMUNITY): Payer: Self-pay | Admitting: Neurosurgery

## 2015-07-30 ENCOUNTER — Encounter (HOSPITAL_COMMUNITY): Payer: Self-pay | Admitting: Emergency Medicine

## 2015-07-30 ENCOUNTER — Emergency Department (HOSPITAL_COMMUNITY)
Admission: EM | Admit: 2015-07-30 | Discharge: 2015-07-30 | Disposition: A | Discharge status: Home / Self Care | Payer: BC Managed Care – PPO | Attending: Emergency Medicine | Admitting: Emergency Medicine

## 2015-07-30 DIAGNOSIS — R55 Syncope and collapse: Secondary | ICD-10-CM | POA: Diagnosis present

## 2015-07-30 DIAGNOSIS — Z8719 Personal history of other diseases of the digestive system: Secondary | ICD-10-CM | POA: Diagnosis not present

## 2015-07-30 DIAGNOSIS — Z85828 Personal history of other malignant neoplasm of skin: Secondary | ICD-10-CM | POA: Diagnosis not present

## 2015-07-30 DIAGNOSIS — Z79899 Other long term (current) drug therapy: Secondary | ICD-10-CM | POA: Insufficient documentation

## 2015-07-30 DIAGNOSIS — Z8639 Personal history of other endocrine, nutritional and metabolic disease: Secondary | ICD-10-CM | POA: Insufficient documentation

## 2015-07-30 DIAGNOSIS — I959 Hypotension, unspecified: Secondary | ICD-10-CM | POA: Insufficient documentation

## 2015-07-30 DIAGNOSIS — Z87442 Personal history of urinary calculi: Secondary | ICD-10-CM | POA: Diagnosis not present

## 2015-07-30 DIAGNOSIS — M1990 Unspecified osteoarthritis, unspecified site: Secondary | ICD-10-CM | POA: Insufficient documentation

## 2015-07-30 DIAGNOSIS — G20 Parkinson's disease: Secondary | ICD-10-CM | POA: Insufficient documentation

## 2015-07-30 DIAGNOSIS — Z87438 Personal history of other diseases of male genital organs: Secondary | ICD-10-CM | POA: Insufficient documentation

## 2015-07-30 DIAGNOSIS — I951 Orthostatic hypotension: Secondary | ICD-10-CM

## 2015-07-30 NOTE — ED Provider Notes (Signed)
CSN: [id number]     Arrival date & time 07/30/15  1232 History   First MD Initiated Contact with Patient 07/30/15 1240     Chief Complaint  Patient presents with  . Loss of Consciousness      HPI Patient presents to the emergency department after 2 episodes of syncope [place] post office [place].  He has Parkinson's disease and has significant tremor.  He just underwent battery replacement of his subthalamic stimulator on 07/19/2015.  He states he has a long-standing history of orthostatic hypotension for which he is on midodrine and is managed by a neurologist at [facility].  He states is not uncommon for him to have multiple episodes like this daily.  The only thing that prompted his visit today was that this occurred in the post office and EMS was contacted.  He states that these events had occurred at home he would have not come to the emergency department.  He's been eating and drinking normally.  His had no diarrhea.  He denies fevers and chills.  He has no complaints at this time.  He would like to go home.   Past Medical History  Diagnosis Date  . 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 Hypersomnia 05/06/2011  . Hyperlipidemia 05/06/2011  . Skin cancer 05/06/2011  . Depression 05/06/2011  . Memory loss 07/12/2012  . GERD (gastroesophageal reflux disease) 12/02/2012  . Hypotension   . Kidney stones   . Enlarged prostate   . Syncope   . Arthritis    Past Surgical History  Procedure Laterality Date  . Deep brain stimulation      Parkinson's disease  . Tonsillectomy      As a child  . Subthalamic stimulator battery replacement N/A 09/01/2012    Procedure: SUBTHALAMIC STIMULATOR BATTERY REPLACEMENT;  Surgeon: Erline Levine, MD;  Location: [place] [facility];  Service: Neurosurgery;  Laterality: N/A;  Deep Brain Stimulator battery change  . Subthalamic stimulator battery replacement N/A 07/19/2015    Procedure: Deep Brain  stimulator battery replacement;  Surgeon: Erline Levine, MD;  Location: [place] [facility];  Service: Neurosurgery;  Laterality: N/A;  Deep Brain stimulator battery replacement   Family History  Problem Relation Age of Onset  . Colon cancer Neg Hx    Social History  Substance Use Topics  . Smoking status: Never Smoker   . Smokeless tobacco: Never Used  . Alcohol Use: No     Comment: heavy drinker at times in the past. None for 5 years    Review of Systems  All other systems reviewed and are negative.     Allergies  Statins  Home Medications   Prior to Admission medications   Medication Sig Start Date End Date Taking? Authorizing Provider  amantadine (SYMMETREL) 100 MG capsule Take 100 mg by mouth 3 (three) times daily.     Historical Provider, MD  AZILECT 1 MG TABS Take 1 mg by mouth daily.  06/13/12   Historical Provider, MD  carbidopa-levodopa (SINEMET) 25-100 MG per tablet Take 1 tablet by mouth 6 (six) times daily.     Historical Provider, MD  ibuprofen (ADVIL,MOTRIN) 800 MG tablet Take 800 mg by mouth every 8 (eight) hours as needed for moderate pain.     Historical Provider, MD  meloxicam (MOBIC) 15 MG tablet Take 1 tablet (15 mg total) by mouth daily. 03/05/15   Biagio Borg, MD  midodrine (PROAMATINE) 10 MG tablet Take 1  tablet (10 mg total) by mouth 3 (three) times daily. 02/02/15   Biagio Borg, MD  oxymetazoline (AFRIN) 0.05 % nasal spray Place 1 spray into both nostrils daily as needed for congestion.    Historical Provider, MD  sertraline (ZOLOFT) 50 MG tablet Take 50 mg by mouth daily.    Historical Provider, MD   BP 158/96 mmHg  Pulse 93  Temp(Src) 97.9 F (36.6 C) (Oral)  Resp 20  Ht 5\' 11"  (1.803 m)  Wt 186 lb (84.369 kg)  BMI 25.95 kg/m2  SpO2 96% Physical Exam  Constitutional: He is oriented to person, place, and time. He appears well-developed and well-nourished.  HENT:  Head: Normocephalic and atraumatic.  Eyes: EOM are normal.  Neck: Normal range of  motion.  Cardiovascular: Normal rate, regular rhythm, normal heart sounds and intact distal pulses.   Pulmonary/Chest: Effort normal and breath sounds normal. No respiratory distress.  Abdominal: Soft. He exhibits no distension. There is no tenderness.  Musculoskeletal: Normal range of motion.  Neurological: He is alert and oriented to person, place, and time.  Significant tremor present.  Skin: Skin is warm and dry.  Psychiatric: He has a normal mood and affect. Judgment normal.  Nursing note and vitals reviewed.   ED Course  Procedures (including critical care time) Labs Review Labs Reviewed - No data to display  Imaging Review No results found. I have personally reviewed and evaluated these images and lab results as part of my medical decision-making.   EKG Interpretation   Date/Time:  Monday July 30 2015 12:36:52 EDT Ventricular Rate:  96 PR Interval:  151 QRS Duration: 92 QT Interval:  347 QTC Calculation: 438 R Axis:   7 Text Interpretation:  Sinus rhythm No significant change was found  Confirmed by Oza Oberle  MD, Lennette Bihari ([id number]) on 07/30/2015 1:51:05 PM      MDM   Final diagnoses:  Orthostatic syncope    Patient is overall well-appearing.  His vital signs are normal.  He feels normal.  He has no acute complaints today.  This appears to be chronic episodes syncope form.  There are no red flags that concern me today.  I do not think he needs an aggressive workup in the emergency department.  He and his friend agree with this.  Patient be discharged home with instructions to return to the emergency department for any new or worsening or concerning symptoms    Jola Schmidt, MD 07/30/15 1356

## 2015-07-30 NOTE — ED Notes (Signed)
Arrives via [facility], syncopal event at post office, refused transport, had 2nd syncopal event at home and allowed transport, hx of frequent ortho static hypotension syncopal events, no trauma, A/O on arrival, recent battery change for implanted neuro transmitter, NAD

## 2015-08-02 ENCOUNTER — Other Ambulatory Visit (INDEPENDENT_AMBULATORY_CARE_PROVIDER_SITE_OTHER): Payer: BC Managed Care – PPO

## 2015-08-02 ENCOUNTER — Encounter: Payer: Self-pay | Admitting: Internal Medicine

## 2015-08-02 ENCOUNTER — Ambulatory Visit (INDEPENDENT_AMBULATORY_CARE_PROVIDER_SITE_OTHER): Payer: BC Managed Care – PPO | Admitting: Internal Medicine

## 2015-08-02 VITALS — BP 120/72 | HR 72 | Temp 98.4°F | Ht 71.0 in | Wt 186.8 lb

## 2015-08-02 DIAGNOSIS — N182 Chronic kidney disease, stage 2 (mild): Secondary | ICD-10-CM

## 2015-08-02 DIAGNOSIS — F329 Major depressive disorder, single episode, unspecified: Secondary | ICD-10-CM

## 2015-08-02 DIAGNOSIS — I951 Orthostatic hypotension: Secondary | ICD-10-CM

## 2015-08-02 DIAGNOSIS — N179 Acute kidney failure, unspecified: Secondary | ICD-10-CM | POA: Diagnosis not present

## 2015-08-02 DIAGNOSIS — F32A Depression, unspecified: Secondary | ICD-10-CM

## 2015-08-02 LAB — BASIC METABOLIC PANEL
BUN: 34 mg/dL — AB (ref 6–23)
CHLORIDE: 105 meq/L (ref 96–112)
CO2: 25 meq/L (ref 19–32)
Calcium: 9.4 mg/dL (ref 8.4–10.5)
Creatinine, Ser: 1.59 mg/dL — ABNORMAL HIGH (ref 0.40–1.50)
GFR: 46.78 mL/min — AB (ref >60.00)
GLUCOSE: 90 mg/dL (ref 70–99)
POTASSIUM: 4.5 meq/L (ref 3.5–5.1)
Sodium: 138 mEq/L (ref 135–145)

## 2015-08-02 NOTE — Assessment & Plan Note (Signed)
More symptomatic recently, with recurrent falls/syncope without injury, to optimize volume status and avoid dehydration, avoid heights, no driving,  to f/u any worsening symptoms or concerns

## 2015-08-02 NOTE — Patient Instructions (Addendum)

## 2015-08-02 NOTE — Progress Notes (Signed)
Pre visit review using our clinic review tool, if applicable. No additional management support is needed unless otherwise documented below in the visit note. 

## 2015-08-02 NOTE — Assessment & Plan Note (Signed)
With mild worsening recently - ? Cardiorenal worsening related to orthostasis, for f/u cr today Lab Results  Component Value Date   CREATININE 1.59* 08/02/2015

## 2015-08-02 NOTE — Assessment & Plan Note (Signed)
stable overall by history and exam, recent data reviewed with pt, and pt to continue medical treatment as before,  to f/u any worsening symptoms or concerns Lab Results  Component Value Date   WBC 8.7 07/19/2015   HGB 14.4 07/19/2015   HCT 42.9 07/19/2015   PLT 196 07/19/2015   GLUCOSE 90 08/02/2015   CHOL 298* 06/12/2015   TRIG 223.0* 06/12/2015   HDL 44.20 06/12/2015   LDLDIRECT 189.0 06/12/2015   LDLCALC 154* 12/08/2014   ALT 4 06/12/2015   AST 11 06/12/2015   NA 138 08/02/2015   K 4.5 08/02/2015   CL 105 08/02/2015   CREATININE 1.59* 08/02/2015   BUN 34* 08/02/2015   CO2 25 08/02/2015   TSH 1.97 06/12/2015   PSA 2.45 06/12/2015

## 2015-08-02 NOTE — Progress Notes (Signed)
Subjective:    Patient ID: John Herring, male    DOB: Jan 03, 1951, 65 y.o.   MRN: [id number]  HPI  Here to f/u; overall doing ok,  Pt denies chest pain, increasing sob or doe, wheezing, orthopnea, PND, increased LE swelling, palpitations, but did have recurrent recent syncope assoc with parkinsons.   Pt denies new neurological symptoms such as new headache, or facial or extremity weakness or numbness.  Pt denies polydipsia, polyuria, or low sugar episode.   Pt denies new neurological symptoms such as new headache, or facial or extremity weakness or numbness.   Pt states overall good compliance with meds.  Last lab with increaed cr and GFR to 40, new for him.,  Denies urinary symptoms such as dysuria, frequency, urgency, flank pain, hematuria or n/v, fever, chills.  Denies worsening depressive symptoms, suicidal ideation, or panic Past Medical History  Diagnosis Date  . 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 Hypersomnia 05/06/2011  . Hyperlipidemia 05/06/2011  . Skin cancer 05/06/2011  . Depression 05/06/2011  . Memory loss 07/12/2012  . GERD (gastroesophageal reflux disease) 12/02/2012  . Hypotension   . Kidney stones   . Enlarged prostate   . Syncope   . Arthritis    Past Surgical History  Procedure Laterality Date  . Deep brain stimulation      Parkinson's disease  . Tonsillectomy      As a child  . Subthalamic stimulator battery replacement N/A 09/01/2012    Procedure: SUBTHALAMIC STIMULATOR BATTERY REPLACEMENT;  Surgeon: Erline Levine, MD;  Location: [place] [facility];  Service: Neurosurgery;  Laterality: N/A;  Deep Brain Stimulator battery change  . Subthalamic stimulator battery replacement N/A 07/19/2015    Procedure: Deep Brain stimulator battery replacement;  Surgeon: Erline Levine, MD;  Location: [place] [facility];  Service: Neurosurgery;  Laterality: N/A;  Deep Brain stimulator battery replacement    reports that he has never smoked. He has never  used smokeless tobacco. He reports that he does not drink alcohol or use illicit drugs. family history is negative for Colon cancer. Allergies  Allergen Reactions  . Statins     Makes parkinson worse   Current Outpatient Prescriptions on File Prior to Visit  Medication Sig Dispense Refill  . amantadine (SYMMETREL) 100 MG capsule Take 100 mg by mouth 3 (three) times daily.     . AZILECT 1 MG TABS Take 1 mg by mouth daily.     . carbidopa-levodopa (SINEMET) 25-100 MG per tablet Take 1 tablet by mouth 6 (six) times daily.     Marland Kitchen ibuprofen (ADVIL,MOTRIN) 800 MG tablet Take 800 mg by mouth every 8 (eight) hours as needed for moderate pain.     . meloxicam (MOBIC) 15 MG tablet Take 1 tablet (15 mg total) by mouth daily. 90 tablet 1  . midodrine (PROAMATINE) 10 MG tablet Take 1 tablet (10 mg total) by mouth 3 (three) times daily. 90 tablet 11  . oxymetazoline (AFRIN) 0.05 % nasal spray Place 1 spray into both nostrils daily as needed for congestion.    . sertraline (ZOLOFT) 50 MG tablet Take 50 mg by mouth daily.     No current facility-administered medications on file prior to visit.   Review of Systems  Constitutional: Negative for unusual diaphoresis or night sweats HENT: Negative for ear swelling or discharge Eyes: Negative for worsening visual haziness  Respiratory: Negative for choking and stridor.   Gastrointestinal: Negative for distension or worsening eructation Genitourinary:  Negative for retention or change in urine volume.  Musculoskeletal: Negative for other MSK pain or swelling Skin: Negative for color change and worsening wound Neurological: Negative for tremors and numbness other than noted  Psychiatric/Behavioral: Negative for decreased concentration or agitation other than above       Objective:   Physical Exam BP 120/72 mmHg  Pulse 72  Temp(Src) 98.4 F (36.9 C) (Oral)  Ht 5\' 11"  (1.803 m)  Wt 186 lb 12 oz (84.709 kg)  BMI 26.06 kg/m2  SpO2 94% VS noted,    Constitutional: Pt appears in no apparent distress HENT: Head: NCAT.  Right Ear: External ear normal.  Left Ear: External ear normal.  Eyes: . Pupils are equal, round, and reactive to light. Conjunctivae and EOM are normal Neck: Normal range of motion. Neck supple.  Cardiovascular: Normal rate and regular rhythm.   Pulmonary/Chest: Effort normal and breath sounds without rales or wheezing.  Neurological: Pt is alert. Not confused , motor grossly intact, + parkinsons movements Skin: Skin is warm. No rash, no LE edema Psychiatric: Pt behavior is normal. No agitation. not depressed affect    Assessment & Plan:

## 2015-08-30 ENCOUNTER — Other Ambulatory Visit: Payer: Self-pay | Admitting: Internal Medicine

## 2015-12-07 ENCOUNTER — Other Ambulatory Visit: Payer: Self-pay | Admitting: *Deleted

## 2015-12-07 MED ORDER — MIDODRINE HCL 10 MG PO TABS: 10.0000 mg | ORAL_TABLET | Freq: Three times a day (TID) | ORAL | 5 refills | Status: DC

## 2015-12-12 ENCOUNTER — Encounter: Payer: Self-pay | Admitting: Internal Medicine

## 2015-12-12 ENCOUNTER — Ambulatory Visit (INDEPENDENT_AMBULATORY_CARE_PROVIDER_SITE_OTHER): Payer: BC Managed Care – PPO | Admitting: Internal Medicine

## 2015-12-12 ENCOUNTER — Other Ambulatory Visit (INDEPENDENT_AMBULATORY_CARE_PROVIDER_SITE_OTHER): Payer: BC Managed Care – PPO

## 2015-12-12 VITALS — BP 124/78 | HR 86 | Temp 98.0°F | Resp 20 | Wt 184.0 lb

## 2015-12-12 DIAGNOSIS — F329 Major depressive disorder, single episode, unspecified: Secondary | ICD-10-CM | POA: Diagnosis not present

## 2015-12-12 DIAGNOSIS — R6889 Other general symptoms and signs: Secondary | ICD-10-CM

## 2015-12-12 DIAGNOSIS — E785 Hyperlipidemia, unspecified: Secondary | ICD-10-CM | POA: Diagnosis not present

## 2015-12-12 DIAGNOSIS — F32A Depression, unspecified: Secondary | ICD-10-CM

## 2015-12-12 DIAGNOSIS — Z0001 Encounter for general adult medical examination with abnormal findings: Secondary | ICD-10-CM

## 2015-12-12 DIAGNOSIS — N182 Chronic kidney disease, stage 2 (mild): Secondary | ICD-10-CM | POA: Diagnosis not present

## 2015-12-12 LAB — HEPATIC FUNCTION PANEL
ALT: 2 U/L (ref 0–53)
AST: 13 U/L (ref 0–37)
Albumin: 4.6 g/dL (ref 3.5–5.2)
Alkaline Phosphatase: 94 U/L (ref 39–117)
BILIRUBIN DIRECT: 0.1 mg/dL (ref 0.0–0.3)
BILIRUBIN TOTAL: 0.6 mg/dL (ref 0.2–1.2)
Total Protein: 7.8 g/dL (ref 6.0–8.3)

## 2015-12-12 LAB — BASIC METABOLIC PANEL
BUN: 24 mg/dL — ABNORMAL HIGH (ref 6–23)
CALCIUM: 9.7 mg/dL (ref 8.4–10.5)
CO2: 27 mEq/L (ref 19–32)
CREATININE: 1.73 mg/dL — AB (ref 0.40–1.50)
Chloride: 103 mEq/L (ref 96–112)
GFR: 42.4 mL/min — AB (ref >60.00)
Glucose, Bld: 100 mg/dL — ABNORMAL HIGH (ref 70–99)
Potassium: 5 mEq/L (ref 3.5–5.1)
Sodium: 138 mEq/L (ref 135–145)

## 2015-12-12 MED ORDER — MIDODRINE HCL 10 MG PO TABS: 10.0000 mg | ORAL_TABLET | Freq: Three times a day (TID) | ORAL | 5 refills | Status: DC

## 2015-12-12 NOTE — Assessment & Plan Note (Signed)
stable overall by history and exam, recent data reviewed with pt, and pt to continue medical treatment as before,  to f/u any worsening symptoms or concerns Lab Results  Component Value Date   CREATININE 1.59 (H) 08/02/2015   For f/u lab

## 2015-12-12 NOTE — Assessment & Plan Note (Signed)
Lab Results  Component Value Date   LDLCALC 154 (H) 12/08/2014   unfort has been statin intolerant, needs to follow lower chol diet

## 2015-12-12 NOTE — Progress Notes (Signed)
Pre visit review using our clinic review tool, if applicable. No additional management support is needed unless otherwise documented below in the visit note. 

## 2015-12-12 NOTE — Assessment & Plan Note (Signed)
stable overall by history and exam, recent data reviewed with pt, and pt to continue medical treatment as before,  to f/u any worsening symptoms or concerns / Lab Results  Component Value Date   WBC 8.7 07/19/2015   HGB 14.4 07/19/2015   HCT 42.9 07/19/2015   PLT 196 07/19/2015   GLUCOSE 90 08/02/2015   CHOL 298 (H) 06/12/2015   TRIG 223.0 (H) 06/12/2015   HDL 44.20 06/12/2015   LDLDIRECT 189.0 06/12/2015   LDLCALC 154 (H) 12/08/2014   ALT 4 06/12/2015   AST 11 06/12/2015   NA 138 08/02/2015   K 4.5 08/02/2015   CL 105 08/02/2015   CREATININE 1.59 (H) 08/02/2015   BUN 34 (H) 08/02/2015   CO2 25 08/02/2015   TSH 1.97 06/12/2015   PSA 2.45 06/12/2015

## 2015-12-12 NOTE — Progress Notes (Signed)
Subjective:    Patient ID: John Herring, male    DOB: Apr 04, 1951, 65 y.o.   MRN: [id number]  HPI Here to f/u; overall doing ok,  Pt denies chest pain, increasing sob or doe, wheezing, orthopnea, PND, increased LE swelling, palpitations, dizziness or syncope.  Pt denies new neurological symptoms such as new headache, or facial or extremity weakness or numbness.  Pt denies polydipsia, polyuria, or low sugar episode.   Pt denies new neurological symptoms such as new headache, or facial or extremity weakness or numbness.   Pt states overall good compliance with meds, mostly trying to follow appropriate diet, with wt overall stable,  but little exercise however.  Due for f/u lab/cr.  Has been intermittent dizzy with a fall 2 days ago, no injury, just abrasion to left elbow.   Pt denies fever, wt loss, night sweats, loss of appetite, or other constitutional symptoms  Overall PD has been better recently, used to 3-4 falls per wk, now less.  Denies worsening depressive symptoms, suicidal ideation, or panic.  Frustrated over the PD Past Medical History:  Diagnosis Date  . Arthritis   . Depression 05/06/2011  . Enlarged prostate   . GERD (gastroesophageal reflux disease) 12/02/2012  . Hyperlipidemia 05/06/2011  . Hypersomnia 05/06/2011  . Hypotension   . 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 Kidney stones   . Memory loss 07/12/2012  . 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 Skin cancer 05/06/2011  . Syncope    Past Surgical History:  Procedure Laterality Date  . deep brain stimulation     Parkinson's disease  . SUBTHALAMIC STIMULATOR BATTERY REPLACEMENT N/A 09/01/2012   Procedure: SUBTHALAMIC STIMULATOR BATTERY REPLACEMENT;  Surgeon: Erline Levine, MD;  Location: [place] [facility];  Service: Neurosurgery;  Laterality: N/A;  Deep Brain Stimulator battery change  . SUBTHALAMIC STIMULATOR BATTERY REPLACEMENT N/A 07/19/2015   Procedure: Deep Brain stimulator battery replacement;  Surgeon: Erline Levine, MD;   Location: [place] [facility];  Service: Neurosurgery;  Laterality: N/A;  Deep Brain stimulator battery replacement  . TONSILLECTOMY     As a child    reports that he has never smoked. He has never used smokeless tobacco. He reports that he does not drink alcohol or use drugs. family history is not on file. Allergies  Allergen Reactions  . Statins     Makes parkinson worse   Current Outpatient Prescriptions on File Prior to Visit  Medication Sig Dispense Refill  . amantadine (SYMMETREL) 100 MG capsule Take 100 mg by mouth 3 (three) times daily.     . AZILECT 1 MG TABS Take 1 mg by mouth daily.     . carbidopa-levodopa (SINEMET) 25-100 MG per tablet Take 1 tablet by mouth 6 (six) times daily.     Marland Kitchen ibuprofen (ADVIL,MOTRIN) 800 MG tablet Take 800 mg by mouth every 8 (eight) hours as needed for moderate pain.     . meloxicam (MOBIC) 15 MG tablet TAKE 1 TABLET DAILY 90 tablet 1  . oxymetazoline (AFRIN) 0.05 % nasal spray Place 1 spray into both nostrils daily as needed for congestion.    . sertraline (ZOLOFT) 50 MG tablet Take 50 mg by mouth daily.     No current facility-administered medications on file prior to visit.    Review of Systems  Constitutional: Negative for unusual diaphoresis or night sweats HENT: Negative for ear swelling or discharge Eyes: Negative for worsening visual haziness  Respiratory: Negative for choking and stridor.   Gastrointestinal: Negative for  distension or worsening eructation Genitourinary: Negative for retention or change in urine volume.  Musculoskeletal: Negative for other MSK pain or swelling Skin: Negative for color change and worsening wound Neurological: Negative for tremors and numbness other than noted  Psychiatric/Behavioral: Negative for decreased concentration or agitation other than above       Objective:   Physical Exam BP 124/78   Pulse 86   Temp 98 F (36.7 C) (Oral)   Resp 20   Wt 184 lb (83.5 kg)   SpO2 92%   BMI 25.66 kg/m  VS  noted,  Constitutional: Pt appears in no apparent distress HENT: Head: NCAT.  Right Ear: External ear normal.  Left Ear: External ear normal.  Eyes: . Pupils are equal, round, and reactive to light. Conjunctivae and EOM are normal Neck: Normal range of motion. Neck supple.  Cardiovascular: Normal rate and regular rhythm.   Pulmonary/Chest: Effort normal and breath sounds without rales or wheezing.  Abd:  Soft, NT, ND, + BS Neurological: Pt is alert. Not confused , motor grossly intact, + PD movements Skin: Skin is warm. No rash, no LE edema Psychiatric: Pt behavior is normal. No agitation. Not depressed affect    Assessment & Plan:

## 2015-12-12 NOTE — Patient Instructions (Signed)

## 2016-02-29 ENCOUNTER — Other Ambulatory Visit: Payer: Self-pay | Admitting: Internal Medicine

## 2016-03-06 ENCOUNTER — Ambulatory Visit (INDEPENDENT_AMBULATORY_CARE_PROVIDER_SITE_OTHER): Payer: BC Managed Care – PPO | Admitting: Internal Medicine

## 2016-03-06 ENCOUNTER — Encounter: Payer: Self-pay | Admitting: Internal Medicine

## 2016-03-06 VITALS — BP 130/70 | HR 89 | Temp 97.6°F | Resp 20

## 2016-03-06 DIAGNOSIS — N138 Other obstructive and reflux uropathy: Secondary | ICD-10-CM | POA: Diagnosis not present

## 2016-03-06 DIAGNOSIS — N401 Enlarged prostate with lower urinary tract symptoms: Secondary | ICD-10-CM

## 2016-03-06 DIAGNOSIS — N182 Chronic kidney disease, stage 2 (mild): Secondary | ICD-10-CM

## 2016-03-06 DIAGNOSIS — R42 Dizziness and giddiness: Secondary | ICD-10-CM | POA: Diagnosis not present

## 2016-03-06 MED ORDER — TAMSULOSIN HCL 0.4 MG PO CAPS: 0.4000 mg | ORAL_CAPSULE | Freq: Every day | ORAL | 3 refills | Status: DC

## 2016-03-06 NOTE — Progress Notes (Signed)
Pre visit review using our clinic review tool, if applicable. No additional management support is needed unless otherwise documented below in the visit note. 

## 2016-03-06 NOTE — Patient Instructions (Signed)
Your EKG was OK today  Please take all new medication as prescribed - the flomax  Please continue all other medications as before, and refills have been done if requested.  Please have the pharmacy call with any other refills you may need.  Please keep your appointments with your specialists as you may have planned  You will be contacted regarding the referral for: Urology  Please return in 6 months, or sooner if needed, with Lab testing done 3-5 days before

## 2016-03-07 NOTE — Progress Notes (Signed)
Subjective:    Patient ID: John Herring, male    DOB: Jul 14, 1950, 65 y.o.   MRN: [id number]  HPI  Here to f/u, unfortunately fell in the lobby without LOC or injury, has daily falls related to PD, with dizzy and Pt denies chest pain, increased sob or doe, wheezing, orthopnea, PND, increased LE swelling, palpitations, dizziness or syncope.  Pt denies polydipsia, polyuria, Pt states overall good compliance with meds, follows closely with neurology.  Denies urinary symptoms such as dysuria, frequency, urgency, flank pain, hematuria or n/v, fever, chills, except has had slow urinary flow, weak stream, hard to get started, and last night up to BR 12 times with small volume, also with some urinary incontinence when cant get to BR in time.  No other hx changes Past Medical History:  Diagnosis Date  . Arthritis   . Depression 05/06/2011  . Enlarged prostate   . GERD (gastroesophageal reflux disease) 12/02/2012  . Hyperlipidemia 05/06/2011  . Hypersomnia 05/06/2011  . Hypotension   . 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 Kidney stones   . Memory loss 07/12/2012  . 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 Skin cancer 05/06/2011  . Syncope    Past Surgical History:  Procedure Laterality Date  . deep brain stimulation     Parkinson's disease  . SUBTHALAMIC STIMULATOR BATTERY REPLACEMENT N/A 09/01/2012   Procedure: SUBTHALAMIC STIMULATOR BATTERY REPLACEMENT;  Surgeon: Erline Levine, MD;  Location: [facility];  Service: Neurosurgery;  Laterality: N/A;  Deep Brain Stimulator battery change  . SUBTHALAMIC STIMULATOR BATTERY REPLACEMENT N/A 07/19/2015   Procedure: Deep Brain stimulator battery replacement;  Surgeon: Erline Levine, MD;  Location: [facility];  Service: Neurosurgery;  Laterality: N/A;  Deep Brain stimulator battery replacement  . TONSILLECTOMY     As a child    reports that he has never smoked. He has never used smokeless tobacco. He reports that he does not drink alcohol or use  drugs. family history is not on file. Allergies  Allergen Reactions  . Statins     Makes parkinson worse   Current Outpatient Prescriptions on File Prior to Visit  Medication Sig Dispense Refill  . amantadine (SYMMETREL) 100 MG capsule Take 100 mg by mouth 3 (three) times daily.     . AZILECT 1 MG TABS Take 1 mg by mouth daily.     . carbidopa-levodopa (SINEMET) 25-100 MG per tablet Take 1 tablet by mouth 6 (six) times daily.     Marland Kitchen ibuprofen (ADVIL,MOTRIN) 800 MG tablet Take 800 mg by mouth every 8 (eight) hours as needed for moderate pain.     . meloxicam (MOBIC) 15 MG tablet TAKE 1 TABLET BY MOUTH EVERY DAY 90 tablet 1  . midodrine (PROAMATINE) 10 MG tablet Take 1 tablet (10 mg total) by mouth 3 (three) times daily. 90 tablet 5  . oxymetazoline (AFRIN) 0.05 % nasal spray Place 1 spray into both nostrils daily as needed for congestion.    . sertraline (ZOLOFT) 50 MG tablet Take 50 mg by mouth daily.     No current facility-administered medications on file prior to visit.    Review of Systems  Constitutional: Negative for unusual diaphoresis or night sweats HENT: Negative for ear swelling or discharge Eyes: Negative for worsening visual haziness  Respiratory: Negative for choking and stridor.   Gastrointestinal: Negative for distension or worsening eructation Genitourinary: Negative for retention or change in urine volume.  Musculoskeletal: Negative for other MSK pain or swelling Skin: Negative  for color change and worsening wound Psychiatric/Behavioral: Negative for decreased concentration or agitation other than above   All other system neg per pt    Objective:   Physical Exam BP 130/70   Pulse 89   Temp 97.6 F (36.4 C) (Oral)   Resp 20   SpO2 96%  VS noted, not ill appearing, seated in wheelchair Constitutional: Pt appears in no apparent distress HENT: Head: NCAT.  Right Ear: External ear normal.  Left Ear: External ear normal.  Eyes: . Pupils are equal, round, and  reactive to light. Conjunctivae and EOM are normal Neck: Normal range of motion. Neck supple.  Cardiovascular: Normal rate and regular rhythm.   Pulmonary/Chest: Effort normal and breath sounds without rales or wheezing.  Abd:  Soft, NT, ND, + BS, no flank tender Neurological: Pt is alert. Not confused , motor intact but with significant PD movements Skin: Skin is warm. No rash, no LE edema Psychiatric: Pt behavior is normal. No agitation.   ECG I have personally interpreted Sinus  Rhythm  -Left atrial enlargement.   Voltage criteria for LVH      Assessment & Plan:

## 2016-03-08 DIAGNOSIS — N400 Enlarged prostate without lower urinary tract symptoms: Secondary | ICD-10-CM | POA: Insufficient documentation

## 2016-03-08 DIAGNOSIS — N138 Other obstructive and reflux uropathy: Secondary | ICD-10-CM | POA: Insufficient documentation

## 2016-03-08 DIAGNOSIS — R42 Dizziness and giddiness: Secondary | ICD-10-CM | POA: Insufficient documentation

## 2016-03-08 DIAGNOSIS — N401 Enlarged prostate with lower urinary tract symptoms: Principal | ICD-10-CM

## 2016-03-08 NOTE — Assessment & Plan Note (Signed)
Lab Results  Component Value Date   CREATININE 1.73 (H) 12/12/2015   Declines f/u lab today, .follwoj

## 2016-03-08 NOTE — Assessment & Plan Note (Signed)
With fall in lobby without injury or LOC, ecg reviewed, declines further labs or other evaluation due to chronci recurrent per pt,  to f/u any worsening symptoms or concerns

## 2016-03-08 NOTE — Assessment & Plan Note (Signed)
With freuency and nocturia, afeb and no pain, unable to give specimen today, for flomax .4 qd, also refer urology

## 2016-06-11 ENCOUNTER — Ambulatory Visit (INDEPENDENT_AMBULATORY_CARE_PROVIDER_SITE_OTHER): Payer: BC Managed Care – PPO | Admitting: Internal Medicine

## 2016-06-11 ENCOUNTER — Encounter: Payer: Self-pay | Admitting: Internal Medicine

## 2016-06-11 ENCOUNTER — Other Ambulatory Visit (INDEPENDENT_AMBULATORY_CARE_PROVIDER_SITE_OTHER): Payer: BC Managed Care – PPO

## 2016-06-11 VITALS — BP 128/82 | HR 84 | Temp 97.8°F | Ht 71.0 in | Wt 182.0 lb

## 2016-06-11 DIAGNOSIS — R8290 Unspecified abnormal findings in urine: Secondary | ICD-10-CM | POA: Diagnosis not present

## 2016-06-11 DIAGNOSIS — Z0000 Encounter for general adult medical examination without abnormal findings: Secondary | ICD-10-CM

## 2016-06-11 DIAGNOSIS — E785 Hyperlipidemia, unspecified: Secondary | ICD-10-CM

## 2016-06-11 LAB — BASIC METABOLIC PANEL
BUN: 32 mg/dL — AB (ref 6–23)
CALCIUM: 9.7 mg/dL (ref 8.4–10.5)
CO2: 28 meq/L (ref 19–32)
Chloride: 105 mEq/L (ref 96–112)
Creatinine, Ser: 1.67 mg/dL — ABNORMAL HIGH (ref 0.40–1.50)
GFR: 44.09 mL/min — AB (ref >60.00)
GLUCOSE: 110 mg/dL — AB (ref 70–99)
Potassium: 5.1 mEq/L (ref 3.5–5.1)
Sodium: 137 mEq/L (ref 135–145)

## 2016-06-11 LAB — LIPID PANEL
CHOLESTEROL: 283 mg/dL — AB (ref 0–200)
HDL: 37.7 mg/dL — AB (ref >39.00)
NonHDL: 245.52
TRIGLYCERIDES: 373 mg/dL — AB (ref 0.0–149.0)
Total CHOL/HDL Ratio: 8
VLDL: 74.6 mg/dL — AB (ref 0.0–40.0)

## 2016-06-11 LAB — PSA: PSA: 4.24 ng/mL — ABNORMAL HIGH (ref 0.10–4.00)

## 2016-06-11 LAB — CBC WITH DIFFERENTIAL/PLATELET
BASOS ABS: 0.1 10*3/uL (ref 0.0–0.1)
Basophils Relative: 1.2 % (ref 0.0–3.0)
EOS PCT: 1.5 % (ref 0.0–5.0)
Eosinophils Absolute: 0.1 10*3/uL (ref 0.0–0.7)
HCT: 40.1 % (ref 39.0–52.0)
HEMOGLOBIN: 13.6 g/dL (ref 13.0–17.0)
LYMPHS ABS: 1.7 10*3/uL (ref 0.7–4.0)
Lymphocytes Relative: 20.9 % (ref 12.0–46.0)
MCHC: 33.9 g/dL (ref 30.0–36.0)
MCV: 92.9 fl (ref 78.0–100.0)
MONO ABS: 0.8 10*3/uL (ref 0.1–1.0)
MONOS PCT: 10.1 % (ref 3.0–12.0)
NEUTROS PCT: 66.3 % (ref 43.0–77.0)
Neutro Abs: 5.2 10*3/uL (ref 1.4–7.7)
Platelets: 221 10*3/uL (ref 150.0–400.0)
RBC: 4.32 Mil/uL (ref 4.22–5.81)
RDW: 14.4 % (ref 11.5–15.5)
WBC: 7.9 10*3/uL (ref 4.0–10.5)

## 2016-06-11 LAB — HEPATIC FUNCTION PANEL
ALBUMIN: 4.5 g/dL (ref 3.5–5.2)
ALK PHOS: 90 U/L (ref 39–117)
ALT: 6 U/L (ref 0–53)
AST: 15 U/L (ref 0–37)
Bilirubin, Direct: 0 mg/dL (ref 0.0–0.3)
TOTAL PROTEIN: 6.8 g/dL (ref 6.0–8.3)
Total Bilirubin: 0.4 mg/dL (ref 0.2–1.2)

## 2016-06-11 LAB — TSH: TSH: 2.35 u[IU]/mL (ref 0.35–4.50)

## 2016-06-11 LAB — LDL CHOLESTEROL, DIRECT: Direct LDL: 180 mg/dL

## 2016-06-11 NOTE — Patient Instructions (Signed)

## 2016-06-11 NOTE — Progress Notes (Signed)
Subjective:    Patient ID: John Herring, male    DOB: [birth year], 66 y.o.   MRN: [id number]  HPI  Here for wellness and f/u;  Overall doing ok;  Pt denies Chest pain, worsening SOB, DOE, wheezing, orthopnea, PND, worsening LE edema, palpitations, dizziness or syncope.  Pt denies neurological change such as new headache, facial or extremity weakness.  Pt denies polydipsia, polyuria, or low sugar symptoms. Pt states overall good compliance with treatment and medications, good tolerability, and has been trying to follow appropriate diet.  Pt denies worsening depressive symptoms, suicidal ideation or panic. No fever, night sweats, wt loss, loss of appetite, or other constitutional symptoms.  Pt states good ability with ADL's, has high fall risk due to PD, home safety reviewed and adequate, no other significant changes in hearing or vision, and only occasionally active with exercise.  Declines prevnar.  Has ongoing urinary incontinence and OAB, seen per urology now on myrbetrix.  No other new history  Sees neurology on regular basis Past Medical History:  Diagnosis Date  . Arthritis   . Depression 05/06/2011  . Enlarged prostate   . GERD (gastroesophageal reflux disease) 12/02/2012  . Hyperlipidemia 05/06/2011  . Hypersomnia 05/06/2011  . Hypotension   . 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 Kidney stones   . Memory loss 07/12/2012  . 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 Skin cancer 05/06/2011  . Syncope    Past Surgical History:  Procedure Laterality Date  . deep brain stimulation     Parkinson's disease  . SUBTHALAMIC STIMULATOR BATTERY REPLACEMENT N/A 09/01/2012   Procedure: SUBTHALAMIC STIMULATOR BATTERY REPLACEMENT;  Surgeon: Erline Levine, MD;  Location: [facility];  Service: Neurosurgery;  Laterality: N/A;  Deep Brain Stimulator battery change  . SUBTHALAMIC STIMULATOR BATTERY REPLACEMENT N/A 07/19/2015   Procedure: Deep Brain stimulator battery replacement;  Surgeon: Erline Levine, MD;  Location: [facility];  Service: Neurosurgery;  Laterality: N/A;  Deep Brain stimulator battery replacement  . TONSILLECTOMY     As a child    reports that he has never smoked. He has never used smokeless tobacco. He reports that he does not drink alcohol or use drugs. family history is not on file. Allergies  Allergen Reactions  . Statins     Makes parkinson worse   Current Outpatient Prescriptions on File Prior to Visit  Medication Sig Dispense Refill  . amantadine (SYMMETREL) 100 MG capsule Take 100 mg by mouth 3 (three) times daily.     . AZILECT 1 MG TABS Take 1 mg by mouth daily.     . carbidopa-levodopa (SINEMET) 25-100 MG per tablet Take 1 tablet by mouth 6 (six) times daily.     Marland Kitchen ibuprofen (ADVIL,MOTRIN) 800 MG tablet Take 800 mg by mouth every 8 (eight) hours as needed for moderate pain.     . meloxicam (MOBIC) 15 MG tablet TAKE 1 TABLET BY MOUTH EVERY DAY 90 tablet 1  . midodrine (PROAMATINE) 10 MG tablet Take 1 tablet (10 mg total) by mouth 3 (three) times daily. 90 tablet 5  . oxymetazoline (AFRIN) 0.05 % nasal spray Place 1 spray into both nostrils daily as needed for congestion.    . sertraline (ZOLOFT) 50 MG tablet Take 50 mg by mouth daily.    . tamsulosin (FLOMAX) 0.4 MG CAPS capsule Take 1 capsule (0.4 mg total) by mouth daily. 90 capsule 3   No current facility-administered medications on file prior to visit.  Review of Systems Constitutional: Negative for increased diaphoresis, or other activity, appetite or siginficant weight change other than noted HENT: Negative for worsening hearing loss, ear pain, facial swelling, mouth sores and neck stiffness.   Eyes: Negative for other worsening pain, redness or visual disturbance.  Respiratory: Negative for choking or stridor Cardiovascular: Negative for other chest pain and palpitations.  Gastrointestinal: Negative for worsening diarrhea, blood in stool, or abdominal distention Genitourinary: Negative  for hematuria, flank pain or change in urine volume.  Musculoskeletal: Negative for myalgias or other joint complaints.  Skin: Negative for other color change and wound or drainage.  Neurological: Negative for syncope and numbness. other than noted Hematological: Negative for adenopathy. or other swelling Psychiatric/Behavioral: Negative for hallucinations, SI, self-injury, decreased concentration or other worsening agitation.  All other system neg per pt    Objective:   Physical Exam BP 128/82   Pulse 84   Temp 97.8 F (36.6 C)   Ht 5\' 11"  (1.803 m)   Wt 182 lb (82.6 kg)   SpO2 95%   BMI 25.38 kg/m  VS noted,  Constitutional: Pt is oriented to person, place, and time. Appears well-developed and well-nourished, in no significant distress Head: Normocephalic and atraumatic  Eyes: Conjunctivae and EOM are normal. Pupils are equal, round, and reactive to light Right Ear: External ear normal.  Left Ear: External ear normal Nose: Nose normal.  Mouth/Throat: Oropharynx is clear and moist  Neck: Normal range of motion. Neck supple. No JVD present. No tracheal deviation present or significant neck LA or mass Cardiovascular: Normal rate, regular rhythm, normal heart sounds and intact distal pulses.   Pulmonary/Chest: Effort normal and breath sounds without rales or wheezing  Abdominal: Soft. Bowel sounds are normal. NT. No HSM  Musculoskeletal: Normal range of motion. Exhibits no edema Lymphadenopathy: Has no cervical adenopathy.  Neurological: Pt is alert and oriented to person, place, and time. Pt has normal reflexes. No cranial nerve deficit. Motor grossly intact Skin: Skin is warm and dry. No rash noted or new ulcers Psychiatric:  Has normal mood and affect. Behavior is normal.  No other new exam findings  Lab Results  Component Value Date   WBC 7.9 06/11/2016   HGB 13.6 06/11/2016   HCT 40.1 06/11/2016   PLT 221.0 06/11/2016   GLUCOSE 110 (H) 06/11/2016   CHOL 283 (H)  06/11/2016   TRIG 373.0 (H) 06/11/2016   HDL 37.70 (L) 06/11/2016   LDLDIRECT 180.0 06/11/2016   LDLCALC 154 (H) 12/08/2014   ALT 6 06/11/2016   AST 15 06/11/2016   NA 137 06/11/2016   K 5.1 06/11/2016   CL 105 06/11/2016   CREATININE 1.67 (H) 06/11/2016   BUN 32 (H) 06/11/2016   CO2 28 06/11/2016   TSH 2.35 06/11/2016   PSA 4.24 (H) 06/11/2016       Assessment & Plan:

## 2016-06-12 ENCOUNTER — Encounter: Payer: Self-pay | Admitting: Internal Medicine

## 2016-06-12 ENCOUNTER — Other Ambulatory Visit: Payer: Self-pay | Admitting: Internal Medicine

## 2016-06-12 DIAGNOSIS — R972 Elevated prostate specific antigen [PSA]: Secondary | ICD-10-CM

## 2016-06-12 LAB — URINALYSIS, ROUTINE W REFLEX MICROSCOPIC
BILIRUBIN URINE: NEGATIVE
HGB URINE DIPSTICK: NEGATIVE
Ketones, ur: NEGATIVE
LEUKOCYTES UA: NEGATIVE
NITRITE: NEGATIVE
RBC / HPF: NONE SEEN (ref 0–?)
Specific Gravity, Urine: 1.02 (ref 1.000–1.030)
TOTAL PROTEIN, URINE-UPE24: NEGATIVE
URINE GLUCOSE: NEGATIVE
Urobilinogen, UA: 0.2 (ref 0.0–1.0)
pH: 6.5 (ref 5.0–8.0)

## 2016-06-12 NOTE — Addendum Note (Signed)
Addended by: Netta Neat D on: 06/12/2016 12:26 PM   Modules accepted: Orders

## 2016-06-13 ENCOUNTER — Ambulatory Visit: Payer: BC Managed Care – PPO | Admitting: Internal Medicine

## 2016-06-15 NOTE — Assessment & Plan Note (Signed)
Statin intolerant, for lower chol diet  

## 2016-06-15 NOTE — Assessment & Plan Note (Signed)

## 2016-07-21 ENCOUNTER — Other Ambulatory Visit: Payer: Self-pay | Admitting: Internal Medicine

## 2016-08-20 ENCOUNTER — Other Ambulatory Visit: Payer: Self-pay | Admitting: Internal Medicine

## 2016-12-11 ENCOUNTER — Ambulatory Visit: Payer: BC Managed Care – PPO | Admitting: Internal Medicine

## 2016-12-12 ENCOUNTER — Encounter: Payer: Self-pay | Admitting: Internal Medicine

## 2016-12-12 ENCOUNTER — Telehealth: Payer: Self-pay | Admitting: Internal Medicine

## 2016-12-12 NOTE — Telephone Encounter (Signed)
Pt is stranded in [place] and has been there for a week stranded and needs clearance to come back to the [place], they are faxing over a from from [employer] to be signed and filled out stating JJ believes the Pt can fly home. I I gave them the main fax number and told to make it out to me.  I will look for it and bring it to you when I find it. If this does not get done today they will be stranded all weekend.

## 2016-12-15 NOTE — Telephone Encounter (Signed)
Note faxed.

## 2016-12-18 ENCOUNTER — Other Ambulatory Visit (INDEPENDENT_AMBULATORY_CARE_PROVIDER_SITE_OTHER): Payer: BC Managed Care – PPO

## 2016-12-18 ENCOUNTER — Ambulatory Visit (INDEPENDENT_AMBULATORY_CARE_PROVIDER_SITE_OTHER): Payer: BC Managed Care – PPO | Admitting: Internal Medicine

## 2016-12-18 ENCOUNTER — Encounter: Payer: Self-pay | Admitting: Internal Medicine

## 2016-12-18 VITALS — BP 120/78 | HR 72 | Temp 98.5°F | Ht 71.0 in | Wt 193.0 lb

## 2016-12-18 DIAGNOSIS — N182 Chronic kidney disease, stage 2 (mild): Secondary | ICD-10-CM

## 2016-12-18 DIAGNOSIS — I2699 Other pulmonary embolism without acute cor pulmonale: Secondary | ICD-10-CM

## 2016-12-18 DIAGNOSIS — E785 Hyperlipidemia, unspecified: Secondary | ICD-10-CM

## 2016-12-18 LAB — HEPATIC FUNCTION PANEL
ALBUMIN: 4.5 g/dL (ref 3.5–5.2)
ALK PHOS: 89 U/L (ref 39–117)
ALT: 3 U/L (ref 0–53)
AST: 20 U/L (ref 0–37)
BILIRUBIN DIRECT: 0 mg/dL (ref 0.0–0.3)
Total Bilirubin: 0.4 mg/dL (ref 0.2–1.2)
Total Protein: 7.4 g/dL (ref 6.0–8.3)

## 2016-12-18 LAB — BASIC METABOLIC PANEL
BUN: 38 mg/dL — ABNORMAL HIGH (ref 6–23)
CALCIUM: 9.8 mg/dL (ref 8.4–10.5)
CO2: 24 mEq/L (ref 19–32)
CREATININE: 1.97 mg/dL — AB (ref 0.40–1.50)
Chloride: 105 mEq/L (ref 96–112)
GFR: 36.38 mL/min — AB (ref >60.00)
GLUCOSE: 93 mg/dL (ref 70–99)
Potassium: 5.9 mEq/L — ABNORMAL HIGH (ref 3.5–5.1)
SODIUM: 135 meq/L (ref 135–145)

## 2016-12-18 LAB — CBC WITH DIFFERENTIAL/PLATELET
Basophils Absolute: 0.1 10*3/uL (ref 0.0–0.1)
Basophils Relative: 0.8 % (ref 0.0–3.0)
EOS PCT: 0.9 % (ref 0.0–5.0)
Eosinophils Absolute: 0.1 10*3/uL (ref 0.0–0.7)
HEMATOCRIT: 40.9 % (ref 39.0–52.0)
Hemoglobin: 13.5 g/dL (ref 13.0–17.0)
LYMPHS ABS: 2.2 10*3/uL (ref 0.7–4.0)
LYMPHS PCT: 23.5 % (ref 12.0–46.0)
MCHC: 33.2 g/dL (ref 30.0–36.0)
MCV: 93 fl (ref 78.0–100.0)
MONOS PCT: 7.8 % (ref 3.0–12.0)
Monocytes Absolute: 0.7 10*3/uL (ref 0.1–1.0)
NEUTROS PCT: 67 % (ref 43.0–77.0)
Neutro Abs: 6.2 10*3/uL (ref 1.4–7.7)
Platelets: 280 10*3/uL (ref 150.0–400.0)
RBC: 4.39 Mil/uL (ref 4.22–5.81)
RDW: 14.4 % (ref 11.5–15.5)
WBC: 9.3 10*3/uL (ref 4.0–10.5)

## 2016-12-18 LAB — LIPID PANEL
Cholesterol: 307 mg/dL — ABNORMAL HIGH (ref 0–200)
HDL: 39 mg/dL — AB (ref >39.00)
NONHDL: 267.55
Total CHOL/HDL Ratio: 8
Triglycerides: 317 mg/dL — ABNORMAL HIGH (ref 0.0–149.0)
VLDL: 63.4 mg/dL — AB (ref 0.0–40.0)

## 2016-12-18 LAB — TSH: TSH: 2.78 u[IU]/mL (ref 0.35–4.50)

## 2016-12-18 LAB — LDL CHOLESTEROL, DIRECT: Direct LDL: 185 mg/dL

## 2016-12-18 MED ORDER — APIXABAN 2.5 MG PO TABS: 2.5000 mg | ORAL_TABLET | Freq: Two times a day (BID) | ORAL | 5 refills | Status: DC

## 2016-12-18 NOTE — Progress Notes (Signed)
Subjective:    Patient ID: John Herring, male    DOB: Sep 29, 1950, 66 y.o.   MRN: [id number]  HPI  Here after unfortunate incidence of acute PE after flying and staying in [place] x 10 days before onset acute symptoms, assoc with acute resp failure, on o2, had CPR briefly prior to hosp due to no respirations (not cler if lost BP and pulse); initial working dx was PNA but only PE found on CTA, and then subsequent LLE DVT.  Now on xarelto, and no overt bleeding. Has been xarelto 15 bid, needs rx for further med, recommended langth of tx should be total 6 mo. Also rx for amlodipine and omeprazole but pt does not feel he needs.  Pt asks for change to eliquis as not comfortable with this after reading side effects Past Medical History:  Diagnosis Date  . Arthritis   . Depression 05/06/2011  . Enlarged prostate   . GERD (gastroesophageal reflux disease) 12/02/2012  . Hyperlipidemia 05/06/2011  . Hypersomnia 05/06/2011  . Hypotension   . 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 Kidney stones   . Memory loss 07/12/2012  . 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 Skin cancer 05/06/2011  . Syncope    Past Surgical History:  Procedure Laterality Date  . deep brain stimulation     Parkinson's disease  . SUBTHALAMIC STIMULATOR BATTERY REPLACEMENT N/A 09/01/2012   Procedure: SUBTHALAMIC STIMULATOR BATTERY REPLACEMENT;  Surgeon: Erline Levine, MD;  Location: [facility];  Service: Neurosurgery;  Laterality: N/A;  Deep Brain Stimulator battery change  . SUBTHALAMIC STIMULATOR BATTERY REPLACEMENT N/A 07/19/2015   Procedure: Deep Brain stimulator battery replacement;  Surgeon: Erline Levine, MD;  Location: [facility];  Service: Neurosurgery;  Laterality: N/A;  Deep Brain stimulator battery replacement  . TONSILLECTOMY     As a child    reports that he has never smoked. He has never used smokeless tobacco. He reports that he does not drink alcohol or use drugs. family history is not on file. Allergies    Allergen Reactions  . Statins     Makes parkinson worse   Review of Systems  Constitutional: Negative for other unusual diaphoresis or sweats HENT: Negative for ear discharge or swelling Eyes: Negative for other worsening visual disturbances Respiratory: Negative for stridor or other swelling  Gastrointestinal: Negative for worsening distension or other blood Genitourinary: Negative for retention or other urinary change Musculoskeletal: Negative for other MSK pain or swelling Skin: Negative for color change or other new lesions Neurological: Negative for worsening tremors and other numbness  Psychiatric/Behavioral: Negative for worsening agitation or other fatigue All other system neg per pt    Objective:   Physical Exam BP 120/78   Pulse 72   Temp 98.5 F (36.9 C) (Oral)   Ht 5\' 11"  (1.803 m)   Wt 193 lb (87.5 kg)   SpO2 98%   BMI 26.92 kg/m  VS noted,  Constitutional: Pt appears in NAD HENT: Head: NCAT.  Right Ear: External ear normal.  Left Ear: External ear normal.  Eyes: . Pupils are equal, round, and reactive to light. Conjunctivae and EOM are normal Nose: without d/c or deformity Neck: Neck supple. Gross normal ROM Cardiovascular: Normal rate and regular rhythm.   Pulmonary/Chest: Effort normal and breath sounds decreased without rales or wheezing.  Neurological: Pt is alert. At baseline orientation, motor grossly intact Skin: Skin is warm. No rashes, other new lesions, no LE edema Psychiatric: Pt behavior is normal without agitation  No other exam findings    Assessment & Plan:

## 2016-12-18 NOTE — Patient Instructions (Addendum)
OK to finish your initial 2 wks of xarelto  Please take all new medication as prescribed - the Eliquis to start the day after the last xarelto dosing, with the total dosing meant to be for 6 months   Please continue all other medications as before, and refills have been done if requested.  Please have the pharmacy call with any other refills you may need.  Please continue your efforts at being more active, low cholesterol diet, and weight control.  Please keep your appointments with your specialists as you may have planned  Please go to the LAB in the Basement (turn left off the elevator) for the tests to be done today  You will be contacted by phone if any changes need to be made immediately.  Otherwise, you will receive a letter about your results with an explanation, but please check with MyChart first.  Please remember to sign up for MyChart if you have not done so, as this will be important to you in the future with finding out test results, communicating by private email, and scheduling acute appointments online when needed.  Please return in 6 months, or sooner if needed

## 2016-12-21 DIAGNOSIS — I2699 Other pulmonary embolism without acute cor pulmonale: Secondary | ICD-10-CM | POA: Insufficient documentation

## 2016-12-21 NOTE — Assessment & Plan Note (Signed)
Ok to finish the starter xarelto, then for eliquis 2.5 bid for total 6 mo,  to f/u any worsening symptoms or concerns

## 2016-12-21 NOTE — Assessment & Plan Note (Signed)
stable overall by history and exam, and pt to continue medical treatment as before,  to f/u any worsening symptoms or concerns, for labs today, has been statin intolerant in the past, for lower chol diet

## 2016-12-21 NOTE — Assessment & Plan Note (Signed)
stable overall by history and exam, recent data reviewed with pt, and pt to continue medical treatment as before,  to f/u any worsening symptoms or concerns Lab Results  Component Value Date   CREATININE 1.97 (H) 12/18/2016

## 2017-02-09 ENCOUNTER — Other Ambulatory Visit: Payer: Self-pay | Admitting: Internal Medicine

## 2017-03-26 ENCOUNTER — Other Ambulatory Visit: Payer: Self-pay | Admitting: Internal Medicine

## 2017-06-05 ENCOUNTER — Other Ambulatory Visit: Payer: Self-pay | Admitting: Internal Medicine

## 2017-06-18 ENCOUNTER — Other Ambulatory Visit (INDEPENDENT_AMBULATORY_CARE_PROVIDER_SITE_OTHER): Payer: BC Managed Care – PPO

## 2017-06-18 ENCOUNTER — Encounter: Payer: Self-pay | Admitting: Internal Medicine

## 2017-06-18 ENCOUNTER — Ambulatory Visit (INDEPENDENT_AMBULATORY_CARE_PROVIDER_SITE_OTHER): Payer: BC Managed Care – PPO | Admitting: Internal Medicine

## 2017-06-18 VITALS — BP 176/100 | HR 80 | Temp 98.4°F | Ht 71.0 in | Wt 198.0 lb

## 2017-06-18 DIAGNOSIS — Z0000 Encounter for general adult medical examination without abnormal findings: Secondary | ICD-10-CM

## 2017-06-18 DIAGNOSIS — E785 Hyperlipidemia, unspecified: Secondary | ICD-10-CM | POA: Diagnosis not present

## 2017-06-18 DIAGNOSIS — R739 Hyperglycemia, unspecified: Secondary | ICD-10-CM | POA: Diagnosis not present

## 2017-06-18 DIAGNOSIS — N182 Chronic kidney disease, stage 2 (mild): Secondary | ICD-10-CM | POA: Diagnosis not present

## 2017-06-18 LAB — CBC WITH DIFFERENTIAL/PLATELET
BASOS PCT: 1.2 % (ref 0.0–3.0)
Basophils Absolute: 0.1 10*3/uL (ref 0.0–0.1)
EOS ABS: 0.1 10*3/uL (ref 0.0–0.7)
EOS PCT: 1.3 % (ref 0.0–5.0)
HCT: 42.3 % (ref 39.0–52.0)
Hemoglobin: 14.3 g/dL (ref 13.0–17.0)
LYMPHS PCT: 29.7 % (ref 12.0–46.0)
Lymphs Abs: 1.9 10*3/uL (ref 0.7–4.0)
MCHC: 33.7 g/dL (ref 30.0–36.0)
MCV: 91.5 fl (ref 78.0–100.0)
Monocytes Absolute: 0.7 10*3/uL (ref 0.1–1.0)
Monocytes Relative: 10.7 % (ref 3.0–12.0)
NEUTROS ABS: 3.7 10*3/uL (ref 1.4–7.7)
Neutrophils Relative %: 57.1 % (ref 43.0–77.0)
PLATELETS: 203 10*3/uL (ref 150.0–400.0)
RBC: 4.63 Mil/uL (ref 4.22–5.81)
RDW: 14.6 % (ref 11.5–15.5)
WBC: 6.5 10*3/uL (ref 4.0–10.5)

## 2017-06-18 LAB — URINALYSIS, ROUTINE W REFLEX MICROSCOPIC
Bilirubin Urine: NEGATIVE
Hgb urine dipstick: NEGATIVE
Ketones, ur: NEGATIVE
Leukocytes, UA: NEGATIVE
NITRITE: NEGATIVE
RBC / HPF: NONE SEEN (ref 0–?)
SPECIFIC GRAVITY, URINE: 1.02 (ref 1.000–1.030)
Total Protein, Urine: NEGATIVE
Urine Glucose: NEGATIVE
Urobilinogen, UA: 0.2 (ref 0.0–1.0)
WBC UA: NONE SEEN (ref 0–?)
pH: 7 (ref 5.0–8.0)

## 2017-06-18 LAB — HEMOGLOBIN A1C: Hgb A1c MFr Bld: 5.7 % (ref 4.6–6.5)

## 2017-06-18 MED ORDER — EZETIMIBE 10 MG PO TABS: 10.0000 mg | ORAL_TABLET | Freq: Every day | ORAL | 3 refills | Status: DC

## 2017-06-18 MED ORDER — ZOSTER VAC RECOMB ADJUVANTED 50 MCG/0.5ML IM SUSR: 0.5000 mL | Freq: Once | INTRAMUSCULAR | 1 refills | Status: AC

## 2017-06-18 NOTE — Progress Notes (Signed)
Subjective:    Patient ID: John Herring, male    DOB: October 15, 1950, 67 y.o.   MRN: [id number]  HPI  Here for wellness and f/u;  Overall doing ok;  Pt denies Chest pain, worsening SOB, DOE, wheezing, orthopnea, PND, worsening LE edema, palpitations, dizziness or syncope.  Pt denies neurological change such as new headache, facial or extremity weakness.  Pt denies polydipsia, polyuria, or low sugar symptoms. Pt states overall good compliance with treatment and medications, good tolerability, and has been trying to follow appropriate diet.  Pt denies worsening depressive symptoms, suicidal ideation or panic. No fever, night sweats, wt loss, loss of appetite, or other constitutional symptoms.  Pt states good ability with ADL's, has high fall risk but some improved recently with stopping the azilect per pt, home safety reviewed and adequate, no other significant changes in hearing or vision, and not active with exercise  No other interval change or new complaints Past Medical History:  Diagnosis Date  . Arthritis   . Depression 05/06/2011  . Enlarged prostate   . GERD (gastroesophageal reflux disease) 12/02/2012  . Hyperlipidemia 05/06/2011  . Hypersomnia 05/06/2011  . Hypotension   . 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 Kidney stones   . Memory loss 07/12/2012  . 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 Skin cancer 05/06/2011  . Syncope    Past Surgical History:  Procedure Laterality Date  . deep brain stimulation     Parkinson's disease  . SUBTHALAMIC STIMULATOR BATTERY REPLACEMENT N/A 09/01/2012   Procedure: SUBTHALAMIC STIMULATOR BATTERY REPLACEMENT;  Surgeon: Erline Levine, MD;  Location: [facility];  Service: Neurosurgery;  Laterality: N/A;  Deep Brain Stimulator battery change  . SUBTHALAMIC STIMULATOR BATTERY REPLACEMENT N/A 07/19/2015   Procedure: Deep Brain stimulator battery replacement;  Surgeon: Erline Levine, MD;  Location: [facility];  Service: Neurosurgery;  Laterality: N/A;   Deep Brain stimulator battery replacement  . TONSILLECTOMY     As a child    reports that  has never smoked. he has never used smokeless tobacco. He reports that he does not drink alcohol or use drugs. family history is not on file. Allergies  Allergen Reactions  . Statins     Makes parkinson worse   Current Outpatient Medications on File Prior to Visit  Medication Sig Dispense Refill  . amantadine (SYMMETREL) 100 MG capsule Take 100 mg by mouth 3 (three) times daily.     Marland Kitchen ibuprofen (ADVIL,MOTRIN) 800 MG tablet Take 800 mg by mouth every 8 (eight) hours as needed for moderate pain.     . meloxicam (MOBIC) 15 MG tablet TAKE 1 TABLET BY MOUTH EVERY DAY 90 tablet 0  . midodrine (PROAMATINE) 10 MG tablet TAKE 1 TABLET BY MOUTH THREE TIMES A DAY 90 tablet 0  . mirabegron ER (MYRBETRIQ) 50 MG TB24 tablet Take 50 mg by mouth daily.    . sertraline (ZOLOFT) 50 MG tablet Take 50 mg by mouth daily.     No current facility-administered medications on file prior to visit.    Review of Systems Constitutional: Negative for other unusual diaphoresis, sweats, appetite or weight changes HENT: Negative for other worsening hearing loss, ear pain, facial swelling, mouth sores or neck stiffness.   Eyes: Negative for other worsening pain, redness or other visual disturbance.  Respiratory: Negative for other stridor or swelling Cardiovascular: Negative for other palpitations or other chest pain  Gastrointestinal: Negative for worsening diarrhea or loose stools, blood in stool, distention or other pain  Genitourinary: Negative for hematuria, flank pain or other change in urine volume.  Musculoskeletal: Negative for myalgias or other joint swelling.  Skin: Negative for other color change, or other wound or worsening drainage.  Neurological: Negative for other syncope or numbness. Hematological: Negative for other adenopathy or swelling Psychiatric/Behavioral: Negative for hallucinations, other worsening  agitation, SI, self-injury, or new decreased concentration All other system neg per pt    Objective:   Physical Exam BP (!) 176/100   Pulse 80   Temp 98.4 F (36.9 C) (Oral)   Ht 5\' 11"  (1.803 m)   Wt 198 lb (89.8 kg)   SpO2 98%   BMI 27.62 kg/m  VS noted,  Constitutional: Pt is oriented to person, place, and time. Appears well-developed and well-nourished, in no significant distress and comfortable Head: Normocephalic and atraumatic  Eyes: Conjunctivae and EOM are normal. Pupils are equal, round, and reactive to light Right Ear: External ear normal without discharge Left Ear: External ear normal without discharge Nose: Nose without discharge or deformity Mouth/Throat: Oropharynx is without other ulcerations and moist  Neck: Normal range of motion. Neck supple. No JVD present. No tracheal deviation present or significant neck LA or mass Cardiovascular: Normal rate, regular rhythm, normal heart sounds and intact distal pulses.   Pulmonary/Chest: WOB normal and breath sounds without rales or wheezing  Abdominal: Soft. Bowel sounds are normal. NT. No HSM  Musculoskeletal: Normal range of motion. Exhibits no edema Lymphadenopathy: Has no other cervical adenopathy.  Neurological: Pt is alert and oriented to person, place, and time. Pt has normal reflexes. No cranial nerve deficit. Motor grossly intact, + PD movements Skin: Skin is warm and dry. No rash noted or new ulcerations Psychiatric:  Has normal mood and affect. Behavior is normal without agitation No other exam findings    Assessment & Plan:

## 2017-06-18 NOTE — Patient Instructions (Addendum)
Your shingles shot was sent to the [facility]  Please take all new medication as prescribed - the zetia  OK to stop the Eliquis when the current bottle is done, then ok for dental work as you have planned  Please continue all other medications as before, and refills have been done if requested.  Please have the pharmacy call with any other refills you may need.  Please continue your efforts at being more active, low cholesterol diet, and weight control.  You are otherwise up to date with prevention measures today.  Please keep your appointments with your specialists as you may have planned  Please go to the LAB in the Basement (turn left off the elevator) for the tests to be done today  You will be contacted by phone if any changes need to be made immediately.  Otherwise, you will receive a letter about your results with an explanation, but please check with MyChart first.  Please remember to sign up for MyChart if you have not done so, as this will be important to you in the future with finding out test results, communicating by private email, and scheduling acute appointments online when needed.  Please return in 6 months, or sooner if needed, with Lab testing done 3-5 days before

## 2017-06-19 ENCOUNTER — Encounter: Payer: Self-pay | Admitting: Internal Medicine

## 2017-06-19 LAB — LIPID PANEL
CHOL/HDL RATIO: 8
CHOLESTEROL: 280 mg/dL — AB (ref 0–200)
HDL: 37.1 mg/dL — AB (ref >39.00)
NonHDL: 242.99
TRIGLYCERIDES: 251 mg/dL — AB (ref 0.0–149.0)
VLDL: 50.2 mg/dL — AB (ref 0.0–40.0)

## 2017-06-19 LAB — BASIC METABOLIC PANEL
BUN: 31 mg/dL — ABNORMAL HIGH (ref 6–23)
CALCIUM: 9.9 mg/dL (ref 8.4–10.5)
CO2: 24 mEq/L (ref 19–32)
CREATININE: 1.74 mg/dL — AB (ref 0.40–1.50)
Chloride: 104 mEq/L (ref 96–112)
GFR: 41.92 mL/min — AB (ref >60.00)
GLUCOSE: 103 mg/dL — AB (ref 70–99)
POTASSIUM: 5 meq/L (ref 3.5–5.1)
Sodium: 139 mEq/L (ref 135–145)

## 2017-06-19 LAB — PSA: PSA: 3.9 ng/mL (ref 0.10–4.00)

## 2017-06-19 LAB — TSH: TSH: 2.17 u[IU]/mL (ref 0.35–4.50)

## 2017-06-19 LAB — LDL CHOLESTEROL, DIRECT: Direct LDL: 191 mg/dL

## 2017-06-19 LAB — HEPATIC FUNCTION PANEL
ALT: 5 U/L (ref 0–53)
AST: 13 U/L (ref 0–37)
Albumin: 4.2 g/dL (ref 3.5–5.2)
Alkaline Phosphatase: 76 U/L (ref 39–117)
BILIRUBIN DIRECT: 0.1 mg/dL (ref 0.0–0.3)
BILIRUBIN TOTAL: 0.4 mg/dL (ref 0.2–1.2)
Total Protein: 7.1 g/dL (ref 6.0–8.3)

## 2017-06-20 ENCOUNTER — Encounter: Payer: Self-pay | Admitting: Internal Medicine

## 2017-06-20 NOTE — Assessment & Plan Note (Signed)
Lab Results  Component Value Date   LDLCALC 154 (H) 12/08/2014  for Zetia 10 qd, lower chol diet,  to f/u any worsening symptoms or concerns

## 2017-06-20 NOTE — Assessment & Plan Note (Signed)

## 2017-06-20 NOTE — Assessment & Plan Note (Signed)
Lab Results  Component Value Date   CREATININE 1.74 (H) 06/18/2017  stable overall by history and exam, recent data reviewed with pt, and pt to continue medical treatment as before,  to f/u any worsening symptoms or concerns

## 2017-06-20 NOTE — Assessment & Plan Note (Signed)
Lab Results  Component Value Date   HGBA1C 5.7 06/18/2017  stable overall by history and exam, recent data reviewed with pt, and pt to continue medical treatment as before,  to f/u any worsening symptoms or concerns

## 2017-06-21 ENCOUNTER — Other Ambulatory Visit: Payer: Self-pay | Admitting: Internal Medicine

## 2017-07-03 ENCOUNTER — Other Ambulatory Visit: Payer: Self-pay | Admitting: Internal Medicine

## 2017-07-06 ENCOUNTER — Other Ambulatory Visit: Payer: Self-pay | Admitting: Internal Medicine

## 2017-11-23 ENCOUNTER — Other Ambulatory Visit: Payer: Self-pay | Admitting: Internal Medicine

## 2017-12-17 ENCOUNTER — Encounter: Payer: Self-pay | Admitting: Internal Medicine

## 2017-12-17 ENCOUNTER — Ambulatory Visit (INDEPENDENT_AMBULATORY_CARE_PROVIDER_SITE_OTHER): Payer: BC Managed Care – PPO | Admitting: Internal Medicine

## 2017-12-17 ENCOUNTER — Other Ambulatory Visit (INDEPENDENT_AMBULATORY_CARE_PROVIDER_SITE_OTHER): Payer: BC Managed Care – PPO

## 2017-12-17 VITALS — BP 124/86 | HR 86 | Temp 98.1°F | Ht 71.0 in | Wt 193.0 lb

## 2017-12-17 DIAGNOSIS — R739 Hyperglycemia, unspecified: Secondary | ICD-10-CM

## 2017-12-17 DIAGNOSIS — E785 Hyperlipidemia, unspecified: Secondary | ICD-10-CM

## 2017-12-17 DIAGNOSIS — Z0000 Encounter for general adult medical examination without abnormal findings: Secondary | ICD-10-CM

## 2017-12-17 DIAGNOSIS — N182 Chronic kidney disease, stage 2 (mild): Secondary | ICD-10-CM | POA: Diagnosis not present

## 2017-12-17 LAB — HEMOGLOBIN A1C: HEMOGLOBIN A1C: 5.8 % (ref 4.6–6.5)

## 2017-12-17 LAB — LIPID PANEL
Cholesterol: 238 mg/dL — ABNORMAL HIGH (ref 0–200)
HDL: 40 mg/dL (ref >39.00)
NonHDL: 197.54
Total CHOL/HDL Ratio: 6
Triglycerides: 373 mg/dL — ABNORMAL HIGH (ref 0.0–149.0)
VLDL: 74.6 mg/dL — AB (ref 0.0–40.0)

## 2017-12-17 LAB — CBC WITH DIFFERENTIAL/PLATELET
BASOS PCT: 1.1 % (ref 0.0–3.0)
Basophils Absolute: 0.1 10*3/uL (ref 0.0–0.1)
EOS PCT: 1 % (ref 0.0–5.0)
Eosinophils Absolute: 0.1 10*3/uL (ref 0.0–0.7)
HEMATOCRIT: 42.7 % (ref 39.0–52.0)
HEMOGLOBIN: 14.4 g/dL (ref 13.0–17.0)
Lymphocytes Relative: 22.4 % (ref 12.0–46.0)
Lymphs Abs: 1.8 10*3/uL (ref 0.7–4.0)
MCHC: 33.7 g/dL (ref 30.0–36.0)
MCV: 91.9 fl (ref 78.0–100.0)
MONO ABS: 0.7 10*3/uL (ref 0.1–1.0)
Monocytes Relative: 8.9 % (ref 3.0–12.0)
Neutro Abs: 5.4 10*3/uL (ref 1.4–7.7)
Neutrophils Relative %: 66.6 % (ref 43.0–77.0)
Platelets: 237 10*3/uL (ref 150.0–400.0)
RBC: 4.65 Mil/uL (ref 4.22–5.81)
RDW: 15.8 % — AB (ref 11.5–15.5)
WBC: 8.1 10*3/uL (ref 4.0–10.5)

## 2017-12-17 LAB — BASIC METABOLIC PANEL
BUN: 38 mg/dL — AB (ref 6–23)
CALCIUM: 9.4 mg/dL (ref 8.4–10.5)
CO2: 28 mEq/L (ref 19–32)
Chloride: 106 mEq/L (ref 96–112)
Creatinine, Ser: 1.9 mg/dL — ABNORMAL HIGH (ref 0.40–1.50)
GFR: 37.81 mL/min — ABNORMAL LOW (ref >60.00)
GLUCOSE: 90 mg/dL (ref 70–99)
Potassium: 4.8 mEq/L (ref 3.5–5.1)
Sodium: 139 mEq/L (ref 135–145)

## 2017-12-17 LAB — HEPATIC FUNCTION PANEL
ALT: 2 U/L (ref 0–53)
AST: 13 U/L (ref 0–37)
Albumin: 4.3 g/dL (ref 3.5–5.2)
Alkaline Phosphatase: 84 U/L (ref 39–117)
BILIRUBIN DIRECT: 0 mg/dL (ref 0.0–0.3)
BILIRUBIN TOTAL: 0.3 mg/dL (ref 0.2–1.2)
Total Protein: 7 g/dL (ref 6.0–8.3)

## 2017-12-17 NOTE — Patient Instructions (Signed)

## 2017-12-17 NOTE — Progress Notes (Signed)
Subjective:    Patient ID: REAL CONA, male    DOB: [birth year], 67 y.o.   MRN: [id number]  HPI  Here to f/u; overall doing ok,  Pt denies chest pain, increasing sob or doe, wheezing, orthopnea, PND, increased LE swelling, palpitations, dizziness or syncope.  Pt denies new neurological symptoms such as new headache, or facial or extremity weakness or numbness.  Pt denies polydipsia, polyuria, or low sugar episode.  Pt states overall good compliance with meds, mostly trying to follow appropriate diet, with wt overall stable,  but little exercise however. No new complaints Past Medical History:  Diagnosis Date  . Arthritis   . Depression 05/06/2011  . Enlarged prostate   . GERD (gastroesophageal reflux disease) 12/02/2012  . Hyperlipidemia 05/06/2011  . Hypersomnia 05/06/2011  . Hypotension   . 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 Kidney stones   . Memory loss 07/12/2012  . 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 Skin cancer 05/06/2011  . Syncope    Past Surgical History:  Procedure Laterality Date  . deep brain stimulation     Parkinson's disease  . SUBTHALAMIC STIMULATOR BATTERY REPLACEMENT N/A 09/01/2012   Procedure: SUBTHALAMIC STIMULATOR BATTERY REPLACEMENT;  Surgeon: Erline Levine, MD;  Location: [facility];  Service: Neurosurgery;  Laterality: N/A;  Deep Brain Stimulator battery change  . SUBTHALAMIC STIMULATOR BATTERY REPLACEMENT N/A 07/19/2015   Procedure: Deep Brain stimulator battery replacement;  Surgeon: Erline Levine, MD;  Location: [facility];  Service: Neurosurgery;  Laterality: N/A;  Deep Brain stimulator battery replacement  . TONSILLECTOMY     As a child    reports that he has never smoked. He has never used smokeless tobacco. He reports that he does not drink alcohol or use drugs. family history is not on file. Allergies  Allergen Reactions  . Statins     Makes parkinson worse   Current Outpatient Medications on File Prior to Visit  Medication Sig  Dispense Refill  . amantadine (SYMMETREL) 100 MG capsule Take 100 mg by mouth 3 (three) times daily.     Marland Kitchen ELIQUIS 2.5 MG TABS tablet TAKE 1 TABLET BY MOUTH TWICE A DAY 60 tablet 11  . ezetimibe (ZETIA) 10 MG tablet Take 1 tablet (10 mg total) by mouth daily. 90 tablet 3  . ibuprofen (ADVIL,MOTRIN) 800 MG tablet Take 800 mg by mouth every 8 (eight) hours as needed for moderate pain.     . meloxicam (MOBIC) 15 MG tablet TAKE 1 TABLET BY MOUTH EVERY DAY 90 tablet 3  . midodrine (PROAMATINE) 10 MG tablet TAKE 1 TABLET BY MOUTH THREE TIMES A DAY 90 tablet 3  . mirabegron ER (MYRBETRIQ) 50 MG TB24 tablet Take 50 mg by mouth daily.    . sertraline (ZOLOFT) 50 MG tablet Take 50 mg by mouth daily.     No current facility-administered medications on file prior to visit.    Review of Systems  Constitutional: Negative for other unusual diaphoresis or sweats HENT: Negative for ear discharge or swelling Eyes: Negative for other worsening visual disturbances Respiratory: Negative for stridor or other swelling  Gastrointestinal: Negative for worsening distension or other blood Genitourinary: Negative for retention or other urinary change Musculoskeletal: Negative for other MSK pain or swelling Skin: Negative for color change or other new lesions Neurological: Negative for worsening tremors and other numbness  Psychiatric/Behavioral: Negative for worsening agitation or other fatigue All other system neg per pt    Objective:   Physical Exam BP 124/86  Pulse 86   Temp 98.1 F (36.7 C) (Oral)   Ht 5\' 11"  (1.803 m)   Wt 193 lb (87.5 kg)   SpO2 95%   BMI 26.92 kg/m  VS noted,  Constitutional: Pt appears in NAD HENT: Head: NCAT.  Right Ear: External ear normal.  Left Ear: External ear normal.  Eyes: . Pupils are equal, round, and reactive to light. Conjunctivae and EOM are normal Nose: without d/c or deformity Neck: Neck supple. Gross normal ROM Cardiovascular: Normal rate and regular rhythm.    Pulmonary/Chest: Effort normal and breath sounds without rales or wheezing.  Abd:  Soft, NT, ND, + BS, no organomegaly Neurological: Pt is alert. At baseline orientation, motor grossly intact, + parkinsons movemements Skin: Skin is warm. No rashes, other new lesions, no LE edema Psychiatric: Pt behavior is normal without agitation  No other exam findings       Assessment & Plan:

## 2017-12-18 ENCOUNTER — Encounter: Payer: Self-pay | Admitting: Internal Medicine

## 2017-12-18 LAB — LDL CHOLESTEROL, DIRECT: Direct LDL: 168 mg/dL

## 2017-12-18 LAB — TSH: TSH: 3.34 u[IU]/mL (ref 0.35–4.50)

## 2017-12-18 LAB — PSA: PSA: 4.14 ng/mL — ABNORMAL HIGH (ref 0.10–4.00)

## 2017-12-19 NOTE — Assessment & Plan Note (Addendum)
stable overall by history and exam, recent data reviewed with pt, and pt to continue medical treatment as before,  to f/u any worsening symptoms or concerns, for f/u a1c with labs 

## 2017-12-19 NOTE — Assessment & Plan Note (Signed)
stable overall by history and exam, and pt to continue medical treatment as before,  to f/u any worsening symptoms or concerns, for f/u lab

## 2017-12-19 NOTE — Assessment & Plan Note (Signed)
stable overall by history and exam, recent data reviewed with pt, and pt to continue medical treatment as before,  to f/u any worsening symptoms or concerns, for f/u labs 

## 2018-01-13 ENCOUNTER — Encounter: Payer: Self-pay | Admitting: Plastic Surgery

## 2018-02-01 NOTE — H&P (Signed)
Subjective:     Patient ID: John Herring is a 67 y.o. male.  HPI  Here for follow up discussion lesion back. This lesion has been present for 10 years. Per patient no prior biopsy. States has slow continued growth with recent sloughing, drainage. Last seen by Dr. Ronnald Ramp for full skin check, has been counseled angioma. 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 No hx melanoma. Notes stimulator wires go near area of concern over right neck.  Last week performed multiple punch biopsies of skin, read as SK and AK.  PMH includes Parkinsons subthalmic stimulator in place, CKD, hyperglycemia (last HbA1c 11/2017 5.8), hx DVT/PE 2018 while traveling to [place], completed anticoagulation with Eliquis.  Accompanied by wife.  Review of Systems  Musculoskeletal: Positive for gait problem.  Neurological: Positive for tremors.    MS: skin lesion Remainder 12 point review negative    Objective:   Physical Exam  Constitutional: He is oriented to person, place, and time.  Cardiovascular: Normal rate, regular rhythm and normal heart sounds.  Pulmonary/Chest: Effort normal and breath sounds normal.  Neurological: He is alert and oriented to person, place, and time.   MS: right upper back/shoulder with 5 x 5 cm raised violaceous lesion with sloughing skin with telangiectasias scabs ove biopsy sites    Assessment:     Lesion right upper back possible angioma Plan:      Copy of pathology provided. Reviewed these are benign findings, but the biopsy likely not reflective of larger mass. Plan to proceed with excision. Reviewed given size of area plan in OR, GA, OP procedure if he does well with anesthesia. Reviewed may not be able to primarily close all of wound. My hope is to limit additional procedures and try to close wound as much as possible, may have open area that requires local wound care. Can pursue skin grafting in future if needed. Reviewed risk recurrence, need for additional procedures  pending pathology, risk bleeding, infection, open wound/infection in area adjacent to his stimulator wires, risk anesthesia.    Irene Limbo, MD [facility] Plastic & Reconstructive Surgery [phone], pin [phone]

## 2018-02-03 ENCOUNTER — Encounter (HOSPITAL_BASED_OUTPATIENT_CLINIC_OR_DEPARTMENT_OTHER): Payer: Self-pay

## 2018-02-03 ENCOUNTER — Other Ambulatory Visit: Payer: Self-pay

## 2018-02-04 ENCOUNTER — Encounter (HOSPITAL_BASED_OUTPATIENT_CLINIC_OR_DEPARTMENT_OTHER)
Admission: RE | Admit: 2018-02-04 | Discharge: 2018-02-04 | Disposition: A | Discharge status: Home / Self Care | Payer: BC Managed Care – PPO | Source: Ambulatory Visit | Attending: Plastic Surgery | Admitting: Plastic Surgery

## 2018-02-04 DIAGNOSIS — Z01812 Encounter for preprocedural laboratory examination: Secondary | ICD-10-CM | POA: Diagnosis present

## 2018-02-04 LAB — BASIC METABOLIC PANEL
Anion gap: 11 (ref 5–15)
BUN: 27 mg/dL — ABNORMAL HIGH (ref 8–23)
CO2: 21 mmol/L — AB (ref 22–32)
CREATININE: 1.54 mg/dL — AB (ref 0.61–1.24)
Calcium: 10 mg/dL (ref 8.9–10.3)
Chloride: 106 mmol/L (ref 98–111)
GFR calc non Af Amer: 45 mL/min — ABNORMAL LOW (ref >60)
GFR, EST AFRICAN AMERICAN: 53 mL/min — AB (ref >60)
GLUCOSE: 90 mg/dL (ref 70–99)
Potassium: 5.4 mmol/L — ABNORMAL HIGH (ref 3.5–5.1)
Sodium: 138 mmol/L (ref 135–145)

## 2018-02-05 NOTE — Progress Notes (Signed)
Lab results reviewed by Dr. Gifford Shave, will proceed with surgery as scheduled.

## 2018-02-12 ENCOUNTER — Ambulatory Visit (HOSPITAL_BASED_OUTPATIENT_CLINIC_OR_DEPARTMENT_OTHER): Payer: BC Managed Care – PPO | Admitting: Anesthesiology

## 2018-02-12 ENCOUNTER — Ambulatory Visit (HOSPITAL_BASED_OUTPATIENT_CLINIC_OR_DEPARTMENT_OTHER)
Admission: RE | Admit: 2018-02-12 | Discharge: 2018-02-12 | Disposition: A | Discharge status: Home / Self Care | Payer: BC Managed Care – PPO | Source: Ambulatory Visit | Attending: Plastic Surgery | Admitting: Plastic Surgery

## 2018-02-12 ENCOUNTER — Encounter (HOSPITAL_BASED_OUTPATIENT_CLINIC_OR_DEPARTMENT_OTHER): Payer: Self-pay | Admitting: Certified Registered Nurse Anesthetist

## 2018-02-12 ENCOUNTER — Other Ambulatory Visit: Payer: Self-pay

## 2018-02-12 ENCOUNTER — Encounter (HOSPITAL_BASED_OUTPATIENT_CLINIC_OR_DEPARTMENT_OTHER)
Admission: RE | Disposition: A | Discharge status: Home / Self Care | Payer: Self-pay | Source: Ambulatory Visit | Attending: Plastic Surgery

## 2018-02-12 DIAGNOSIS — G4730 Sleep apnea, unspecified: Secondary | ICD-10-CM | POA: Insufficient documentation

## 2018-02-12 DIAGNOSIS — C44519 Basal cell carcinoma of skin of other part of trunk: Secondary | ICD-10-CM | POA: Insufficient documentation

## 2018-02-12 DIAGNOSIS — R222 Localized swelling, mass and lump, trunk: Secondary | ICD-10-CM | POA: Diagnosis present

## 2018-02-12 DIAGNOSIS — N189 Chronic kidney disease, unspecified: Secondary | ICD-10-CM | POA: Insufficient documentation

## 2018-02-12 DIAGNOSIS — G20 Parkinson's disease: Secondary | ICD-10-CM | POA: Diagnosis not present

## 2018-02-12 DIAGNOSIS — Z86718 Personal history of other venous thrombosis and embolism: Secondary | ICD-10-CM | POA: Diagnosis not present

## 2018-02-12 DIAGNOSIS — Z86711 Personal history of pulmonary embolism: Secondary | ICD-10-CM | POA: Diagnosis not present

## 2018-02-12 HISTORY — PX: MASS EXCISION: SHX2000

## 2018-02-12 SURGERY — EXCISION MASS
Anesthesia: General | Site: Back

## 2018-02-12 MED ORDER — FENTANYL CITRATE (PF) 100 MCG/2ML IJ SOLN
25.0000 ug | INTRAMUSCULAR | Status: DC | PRN
  Administered 2018-02-12: 25 ug via INTRAVENOUS

## 2018-02-12 MED ORDER — OXYCODONE HCL 5 MG PO TABS: 5.0000 mg | ORAL_TABLET | Freq: Once | ORAL | Status: DC | PRN

## 2018-02-12 MED ORDER — CHLORHEXIDINE GLUCONATE CLOTH 2 % EX PADS: 6.0000 | MEDICATED_PAD | Freq: Once | CUTANEOUS | Status: DC

## 2018-02-12 MED ORDER — ROCURONIUM BROMIDE 100 MG/10ML IV SOLN
INTRAVENOUS | Status: DC | PRN
  Administered 2018-02-12: 40 mg via INTRAVENOUS

## 2018-02-12 MED ORDER — PROPOFOL 10 MG/ML IV BOLUS
INTRAVENOUS | Status: AC
  Filled 2018-02-12: qty 20

## 2018-02-12 MED ORDER — MEPERIDINE HCL 25 MG/ML IJ SOLN: 6.2500 mg | INTRAMUSCULAR | Status: DC | PRN

## 2018-02-12 MED ORDER — SUGAMMADEX SODIUM 200 MG/2ML IV SOLN
INTRAVENOUS | Status: DC | PRN
  Administered 2018-02-12: 200 mg via INTRAVENOUS

## 2018-02-12 MED ORDER — DEXAMETHASONE SODIUM PHOSPHATE 10 MG/ML IJ SOLN
INTRAMUSCULAR | Status: AC
  Filled 2018-02-12: qty 1

## 2018-02-12 MED ORDER — BUPIVACAINE-EPINEPHRINE 0.25% -1:200000 IJ SOLN
INTRAMUSCULAR | Status: AC
  Filled 2018-02-12: qty 1

## 2018-02-12 MED ORDER — EPHEDRINE SULFATE-NACL 50-0.9 MG/10ML-% IV SOSY
PREFILLED_SYRINGE | INTRAVENOUS | Status: DC | PRN
  Administered 2018-02-12: 10 mg via INTRAVENOUS
  Administered 2018-02-12: 15 mg via INTRAVENOUS

## 2018-02-12 MED ORDER — CEFAZOLIN SODIUM 1 G IJ SOLR
INTRAMUSCULAR | Status: AC
  Filled 2018-02-12: qty 20

## 2018-02-12 MED ORDER — FENTANYL CITRATE (PF) 100 MCG/2ML IJ SOLN
INTRAMUSCULAR | Status: AC
  Filled 2018-02-12: qty 2

## 2018-02-12 MED ORDER — FENTANYL CITRATE (PF) 100 MCG/2ML IJ SOLN
INTRAMUSCULAR | Status: DC | PRN
  Administered 2018-02-12 (×2): 50 ug via INTRAVENOUS

## 2018-02-12 MED ORDER — SUGAMMADEX SODIUM 500 MG/5ML IV SOLN
INTRAVENOUS | Status: AC
  Filled 2018-02-12: qty 5

## 2018-02-12 MED ORDER — MIDAZOLAM HCL 2 MG/2ML IJ SOLN: 1.0000 mg | INTRAMUSCULAR | Status: DC | PRN

## 2018-02-12 MED ORDER — ONDANSETRON HCL 4 MG/2ML IJ SOLN
INTRAMUSCULAR | Status: DC | PRN
  Administered 2018-02-12: 4 mg via INTRAVENOUS

## 2018-02-12 MED ORDER — ONDANSETRON HCL 4 MG/2ML IJ SOLN
INTRAMUSCULAR | Status: AC
  Filled 2018-02-12: qty 2

## 2018-02-12 MED ORDER — LIDOCAINE HCL (CARDIAC) PF 100 MG/5ML IV SOSY
PREFILLED_SYRINGE | INTRAVENOUS | Status: DC | PRN
  Administered 2018-02-12: 80 mg via INTRAVENOUS

## 2018-02-12 MED ORDER — FENTANYL CITRATE (PF) 100 MCG/2ML IJ SOLN: 50.0000 ug | INTRAMUSCULAR | Status: DC | PRN

## 2018-02-12 MED ORDER — ACETAMINOPHEN 325 MG PO TABS: 325.0000 mg | ORAL_TABLET | ORAL | Status: DC | PRN

## 2018-02-12 MED ORDER — ROCURONIUM BROMIDE 50 MG/5ML IV SOSY
PREFILLED_SYRINGE | INTRAVENOUS | Status: AC
  Filled 2018-02-12: qty 5

## 2018-02-12 MED ORDER — PHENYLEPHRINE 40 MCG/ML (10ML) SYRINGE FOR IV PUSH (FOR BLOOD PRESSURE SUPPORT)
PREFILLED_SYRINGE | INTRAVENOUS | Status: AC
  Filled 2018-02-12: qty 10

## 2018-02-12 MED ORDER — BUPIVACAINE-EPINEPHRINE 0.25% -1:200000 IJ SOLN
INTRAMUSCULAR | Status: DC | PRN
  Administered 2018-02-12: 20 mL

## 2018-02-12 MED ORDER — PROPOFOL 10 MG/ML IV BOLUS
INTRAVENOUS | Status: DC | PRN
  Administered 2018-02-12: 160 mg via INTRAVENOUS

## 2018-02-12 MED ORDER — CEFAZOLIN SODIUM-DEXTROSE 2-4 GM/100ML-% IV SOLN
2.0000 g | INTRAVENOUS | Status: AC
  Administered 2018-02-12: 2 g via INTRAVENOUS

## 2018-02-12 MED ORDER — MIDAZOLAM HCL 2 MG/2ML IJ SOLN
INTRAMUSCULAR | Status: AC
  Filled 2018-02-12: qty 2

## 2018-02-12 MED ORDER — ACETAMINOPHEN 160 MG/5ML PO SOLN: 325.0000 mg | ORAL | Status: DC | PRN

## 2018-02-12 MED ORDER — HYDROCODONE-ACETAMINOPHEN 5-325 MG PO TABS: 1.0000 | ORAL_TABLET | Freq: Four times a day (QID) | ORAL | 0 refills | Status: DC | PRN

## 2018-02-12 MED ORDER — MIDAZOLAM HCL 2 MG/2ML IJ SOLN
INTRAMUSCULAR | Status: DC | PRN
  Administered 2018-02-12: 1 mg via INTRAVENOUS

## 2018-02-12 MED ORDER — PHENYLEPHRINE 40 MCG/ML (10ML) SYRINGE FOR IV PUSH (FOR BLOOD PRESSURE SUPPORT)
PREFILLED_SYRINGE | INTRAVENOUS | Status: DC | PRN
  Administered 2018-02-12: 160 ug via INTRAVENOUS
  Administered 2018-02-12: 80 ug via INTRAVENOUS
  Administered 2018-02-12: 100 ug via INTRAVENOUS

## 2018-02-12 MED ORDER — SCOPOLAMINE 1 MG/3DAYS TD PT72: 1.0000 | MEDICATED_PATCH | Freq: Once | TRANSDERMAL | Status: DC | PRN

## 2018-02-12 MED ORDER — EPHEDRINE 5 MG/ML INJ
INTRAVENOUS | Status: AC
  Filled 2018-02-12: qty 10

## 2018-02-12 MED ORDER — LACTATED RINGERS IV SOLN
INTRAVENOUS | Status: DC
  Administered 2018-02-12 (×2): via INTRAVENOUS

## 2018-02-12 MED ORDER — ONDANSETRON HCL 4 MG/2ML IJ SOLN: 4.0000 mg | Freq: Once | INTRAMUSCULAR | Status: DC | PRN

## 2018-02-12 MED ORDER — BUPIVACAINE HCL (PF) 0.25 % IJ SOLN
INTRAMUSCULAR | Status: AC
  Filled 2018-02-12: qty 30

## 2018-02-12 MED ORDER — LIDOCAINE 2% (20 MG/ML) 5 ML SYRINGE
INTRAMUSCULAR | Status: AC
  Filled 2018-02-12: qty 5

## 2018-02-12 MED ORDER — OXYCODONE HCL 5 MG/5ML PO SOLN: 5.0000 mg | Freq: Once | ORAL | Status: DC | PRN

## 2018-02-12 SURGICAL SUPPLY — 74 items
ADH SKN CLS APL DERMABOND .7 (GAUZE/BANDAGES/DRESSINGS)
APL SKNCLS STERI-STRIP NONHPOA (GAUZE/BANDAGES/DRESSINGS)
BENZOIN TINCTURE PRP APPL 2/3 (GAUZE/BANDAGES/DRESSINGS) IMPLANT
BLADE CLIPPER SURG (BLADE) IMPLANT
BLADE SURG 10 STRL SS (BLADE) ×2 IMPLANT
BLADE SURG 11 STRL SS (BLADE) IMPLANT
BLADE SURG 15 STRL LF DISP TIS (BLADE) ×1 IMPLANT
BLADE SURG 15 STRL SS (BLADE) ×3
CANISTER SUCT 1200ML W/VALVE (MISCELLANEOUS) ×2 IMPLANT
CHLORAPREP W/TINT 26ML (MISCELLANEOUS) ×3 IMPLANT
CLOSURE WOUND 1/2 X4 (GAUZE/BANDAGES/DRESSINGS)
COVER BACK TABLE 60X90IN (DRAPES) ×3 IMPLANT
COVER MAYO STAND STRL (DRAPES) ×3 IMPLANT
COVER WAND RF STERILE (DRAPES) IMPLANT
DERMABOND ADVANCED (GAUZE/BANDAGES/DRESSINGS)
DERMABOND ADVANCED .7 DNX12 (GAUZE/BANDAGES/DRESSINGS) IMPLANT
DRAIN JP 10F RND SILICONE (MISCELLANEOUS) IMPLANT
DRAPE LAPAROTOMY 100X72 PEDS (DRAPES) ×2 IMPLANT
DRAPE U-SHAPE 76X120 STRL (DRAPES) IMPLANT
DRSG TELFA 3X8 NADH (GAUZE/BANDAGES/DRESSINGS) IMPLANT
ELECT COATED BLADE 2.86 ST (ELECTRODE) ×2 IMPLANT
ELECT NDL BLADE 2-5/6 (NEEDLE) ×1 IMPLANT
ELECT NEEDLE BLADE 2-5/6 (NEEDLE) IMPLANT
ELECT REM PT RETURN 9FT ADLT (ELECTROSURGICAL)
ELECT REM PT RETURN 9FT PED (ELECTROSURGICAL)
ELECTRODE REM PT RETRN 9FT PED (ELECTROSURGICAL) IMPLANT
ELECTRODE REM PT RTRN 9FT ADLT (ELECTROSURGICAL) IMPLANT
EVACUATOR SILICONE 100CC (DRAIN) IMPLANT
GAUZE SPONGE 4X4 12PLY STRL LF (GAUZE/BANDAGES/DRESSINGS) IMPLANT
GAUZE XEROFORM 1X8 LF (GAUZE/BANDAGES/DRESSINGS) IMPLANT
GLOVE BIO SURGEON STRL SZ 6 (GLOVE) ×3 IMPLANT
GLOVE BIOGEL PI IND STRL 7.0 (GLOVE) IMPLANT
GLOVE BIOGEL PI INDICATOR 7.0 (GLOVE) ×4
GLOVE NEODERM STRL 7.5 LF PF (GLOVE) IMPLANT
GLOVE SURG NEODERM 7.5  LF PF (GLOVE) ×2
GOWN STRL REUS W/ TWL LRG LVL3 (GOWN DISPOSABLE) ×2 IMPLANT
GOWN STRL REUS W/TWL LRG LVL3 (GOWN DISPOSABLE) ×6
NDL HYPO 30GX1 BEV (NEEDLE) IMPLANT
NDL PRECISIONGLIDE 27X1.5 (NEEDLE) ×1 IMPLANT
NEEDLE HYPO 30GX1 BEV (NEEDLE) IMPLANT
NEEDLE PRECISIONGLIDE 27X1.5 (NEEDLE) ×3 IMPLANT
NS IRRIG 1000ML POUR BTL (IV SOLUTION) ×2 IMPLANT
PACK BASIN DAY SURGERY FS (CUSTOM PROCEDURE TRAY) ×3 IMPLANT
PAD DRESSING TELFA 3X8 NADH (GAUZE/BANDAGES/DRESSINGS) IMPLANT
PENCIL BUTTON HOLSTER BLD 10FT (ELECTRODE) ×3 IMPLANT
RUBBERBAND STERILE (MISCELLANEOUS) IMPLANT
SHEET MEDIUM DRAPE 40X70 STRL (DRAPES) IMPLANT
SLEEVE SCD COMPRESS KNEE MED (MISCELLANEOUS) IMPLANT
SPONGE GAUZE 2X2 8PLY STER LF (GAUZE/BANDAGES/DRESSINGS)
SPONGE GAUZE 2X2 8PLY STRL LF (GAUZE/BANDAGES/DRESSINGS) IMPLANT
SPONGE LAP 18X18 RF (DISPOSABLE) IMPLANT
STAPLER VISISTAT 35W (STAPLE) ×3 IMPLANT
STRIP CLOSURE SKIN 1/2X4 (GAUZE/BANDAGES/DRESSINGS) IMPLANT
SUCTION FRAZIER HANDLE 10FR (MISCELLANEOUS)
SUCTION TUBE FRAZIER 10FR DISP (MISCELLANEOUS) IMPLANT
SUT ETHILON 4 0 PS 2 18 (SUTURE) ×4 IMPLANT
SUT MNCRL AB 4-0 PS2 18 (SUTURE) IMPLANT
SUT MON AB 5-0 P3 18 (SUTURE) IMPLANT
SUT PDS AB 2-0 CT2 27 (SUTURE) ×4 IMPLANT
SUT PLAIN 5 0 P 3 18 (SUTURE) IMPLANT
SUT PROLENE 5 0 P 3 (SUTURE) IMPLANT
SUT PROLENE 6 0 P 1 18 (SUTURE) IMPLANT
SUT SILK 2 0 SH (SUTURE) ×2 IMPLANT
SUT VIC AB 3-0 FS2 27 (SUTURE) ×2 IMPLANT
SUT VICRYL 4-0 PS2 18IN ABS (SUTURE) IMPLANT
SWAB COLLECTION DEVICE MRSA (MISCELLANEOUS) IMPLANT
SWAB CULTURE ESWAB REG 1ML (MISCELLANEOUS) IMPLANT
SYR BULB 3OZ (MISCELLANEOUS) IMPLANT
SYR CONTROL 10ML LL (SYRINGE) ×3 IMPLANT
TOWEL GREEN STERILE FF (TOWEL DISPOSABLE) ×3 IMPLANT
TRAY DSU PREP LF (CUSTOM PROCEDURE TRAY) IMPLANT
TUBE CONNECTING 20'X1/4 (TUBING) ×1
TUBE CONNECTING 20X1/4 (TUBING) ×1 IMPLANT
YANKAUER SUCT BULB TIP 10FT TU (MISCELLANEOUS) IMPLANT

## 2018-02-12 NOTE — Discharge Instructions (Signed)

## 2018-02-12 NOTE — Interval H&P Note (Signed)
History and Physical Interval Note:  02/12/2018 10:14 AM  John Herring  has presented today for surgery, with the diagnosis of soft tissue mass back, angioma back  The various methods of treatment have been discussed with the patient and family. After consideration of risks, benefits and other options for treatment, the patient has consented to  Procedure(s): excision lesion back, layered closure 15 cm (N/A) as a surgical intervention .  The patient's history has been reviewed, patient examined, no change in status, stable for surgery.  I have reviewed the patient's chart and labs.  Questions were answered to the patient's satisfaction.     Arnoldo Hooker Shene [place]

## 2018-02-12 NOTE — Anesthesia Procedure Notes (Signed)
Procedure Name: Intubation Date/Time: 02/12/2018 10:57 AM Performed by: Raenette Rover, CRNA Pre-anesthesia Checklist: Patient identified, Emergency Drugs available, Suction available and Patient being monitored Patient Re-evaluated:Patient Re-evaluated prior to induction Oxygen Delivery Method: Circle system utilized Preoxygenation: Pre-oxygenation with 100% oxygen Induction Type: IV induction Ventilation: Mask ventilation without difficulty Laryngoscope Size: Mac and 4 Grade View: Grade I Tube type: Oral Tube size: 8.0 mm Number of attempts: 1 Airway Equipment and Method: Stylet Placement Confirmation: ETT inserted through vocal cords under direct vision,  positive ETCO2,  CO2 detector and breath sounds checked- equal and bilateral Secured at: 22 cm Tube secured with: Tape Dental Injury: Teeth and Oropharynx as per pre-operative assessment

## 2018-02-12 NOTE — Anesthesia Preprocedure Evaluation (Addendum)
Anesthesia Evaluation  Patient identified by MRN, date of birth, ID band Patient awake    Reviewed: Allergy & Precautions, H&P , NPO status , Patient's Chart, lab work & pertinent test results, reviewed documented beta blocker date and time   Airway Mallampati: I  TM Distance: >3 FB Neck ROM: Full    Dental no notable dental hx. (+) Teeth Intact, Dental Advisory Given   Pulmonary sleep apnea ,  Does not use CPAP   Pulmonary exam normal breath sounds clear to auscultation       Cardiovascular negative cardio ROS Normal cardiovascular exam Rhythm:Regular Rate:Normal     Neuro/Psych PSYCHIATRIC DISORDERS Depression H/O Parkinsonism  Deep Brain stimulator     GI/Hepatic negative GI ROS, Neg liver ROS,   Endo/Other  negative endocrine ROS  Renal/GU negative Renal ROS  negative genitourinary   Musculoskeletal negative musculoskeletal ROS (+)   Abdominal   Peds negative pediatric ROS (+)  Hematology negative hematology ROS (+)   Anesthesia Other Findings   Reproductive/Obstetrics negative OB ROS                            Anesthesia Physical  Anesthesia Plan  ASA: III  Anesthesia Plan: General   Post-op Pain Management:    Induction: Intravenous  PONV Risk Score and Plan: 1 and Ondansetron and Treatment may vary due to age or medical condition  Airway Management Planned: LMA and Oral ETT  Additional Equipment:   Intra-op Plan:   Post-operative Plan: Extubation in OR  Informed Consent: I have reviewed the patients History and Physical, chart, labs and discussed the procedure including the risks, benefits and alternatives for the proposed anesthesia with the patient or authorized representative who has indicated his/her understanding and acceptance.   Dental advisory given  Plan Discussed with: CRNA, Surgeon and Anesthesiologist  Anesthesia Plan Comments:          Anesthesia Quick Evaluation

## 2018-02-12 NOTE — Op Note (Signed)
Operative Note   DATE OF OPERATION: 10.25.19  LOCATION: [facility]-outpatient  SURGICAL DIVISION: Plastic Surgery  PREOPERATIVE DIAGNOSES:  Lesion upper back  POSTOPERATIVE DIAGNOSES:  same  PROCEDURE:  1. Excision benign lesion 8 cm trunk 2. Layered closure trunk 12 cm  SURGEON: Irene Limbo MD MBA  ASSISTANT: none  ANESTHESIA:  General.   EBL: 25 ml  COMPLICATIONS: None immediate.   INDICATIONS FOR PROCEDURE:  The patient, John Herring, is a 67 y.o. male born on 16-Sep-1950, is here for excision skin lesion upper back present for years with continued growth.   FINDINGS: Right upper back skin lesion with ulceration, necrotic fluid present within lesion, not grossly invading muscle but extending through muscular fascia.  DESCRIPTION OF PROCEDURE:  The patient's operative site was marked with the patient in the preoperative area. The patient was taken to the operating room. SCDs were placed and IV antibiotics were given. Following induction, patient placed in prone position. The patient's operative site was prepped and draped in a sterile fashion. A time out was performed and all information was confirmed to be correct.  Local anesthetic infiltrated surrounding mass. 1 cm margins marked surrounding visible and palpable mass. Sharp excision of lesion with borders completed diameter 8 cm. Resection included fascia over deltoid. Hemostasis ensured. Additional local anesthetic infiltrated base wound. Adjacent skin elevated in SQ plane to aid with closure. Closure completed with interrupted 2-0 PDS in superficial fascia, 3-0 vicryl in dermis. Skin closure completed with short running 4-0 nylon horizontal mattress, length 12 cm. Dry dressing applied.  The patient was returned to supine position, allowed to wake from anesthesia, extubated and taken to the recovery room in satisfactory condition.   SPECIMENS: lesion back  DRAINS: none  Irene Limbo, MD [facility] Plastic &  Reconstructive Surgery [phone], pin [phone]

## 2018-02-12 NOTE — Transfer of Care (Signed)
Immediate Anesthesia Transfer of Care Note  Patient: John Herring  Procedure(s) Performed: excision lesion back, layered closure 15 cm (N/A Back)  Patient Location: PACU  Anesthesia Type:General  Level of Consciousness: sedated  Airway & Oxygen Therapy: Patient Spontanous Breathing and Patient connected to face mask oxygen  Post-op Assessment: Report given to RN and Post -op Vital signs reviewed and stable  Post vital signs: Reviewed and stable  Last Vitals:  Vitals Value Taken Time  BP 147/81 02/12/2018 12:08 PM  Temp    Pulse 86 02/12/2018 12:10 PM  Resp 22 02/12/2018 12:10 PM  SpO2 100 % 02/12/2018 12:10 PM  Vitals shown include unvalidated device data.  Last Pain:  Vitals:   02/12/18 1023  TempSrc:   PainSc: 0-No pain         Complications: No apparent anesthesia complications

## 2018-02-15 ENCOUNTER — Encounter (HOSPITAL_BASED_OUTPATIENT_CLINIC_OR_DEPARTMENT_OTHER): Payer: Self-pay | Admitting: Plastic Surgery

## 2018-02-15 NOTE — Anesthesia Postprocedure Evaluation (Signed)
Anesthesia Post Note  Patient: John Herring  Procedure(s) Performed: excision lesion back, layered closure 15 cm (N/A Back)     Patient location during evaluation: PACU Anesthesia Type: General Level of consciousness: awake and alert Pain management: pain level controlled Vital Signs Assessment: post-procedure vital signs reviewed and stable Respiratory status: spontaneous breathing, nonlabored ventilation, respiratory function stable and patient connected to nasal cannula oxygen Cardiovascular status: blood pressure returned to baseline and stable Postop Assessment: no apparent nausea or vomiting Anesthetic complications: no    Last Vitals:  Vitals:   02/12/18 1237 02/12/18 1256  BP:  132/70  Pulse: 87 63  Resp: (!) 22 20  Temp:  36.7 C  SpO2: 93% 93%    Last Pain:  Vitals:   02/12/18 1256  TempSrc:   PainSc: 4                  Ena Demary

## 2018-02-18 ENCOUNTER — Other Ambulatory Visit: Payer: Self-pay | Admitting: Internal Medicine

## 2018-02-18 DIAGNOSIS — C4491 Basal cell carcinoma of skin, unspecified: Secondary | ICD-10-CM | POA: Insufficient documentation

## 2018-03-05 ENCOUNTER — Other Ambulatory Visit: Payer: Self-pay

## 2018-03-05 ENCOUNTER — Encounter (HOSPITAL_BASED_OUTPATIENT_CLINIC_OR_DEPARTMENT_OTHER): Payer: Self-pay | Admitting: *Deleted

## 2018-03-05 NOTE — H&P (Signed)
Subjective:     Patient ID: John Herring is a 67 y.o. male.  Post op excision back lesion. Suffered dehiscence incision partial within at 5-7 d post op. Current wound care moist to dry. Final pathology with nodular BCC peripheral margins clear, extending to deep margin. There was necrosis within tumor at time of excision.  From initial consult: "This lesion has been present for 10 years. Per patient no prior biopsy. States has slow continued growth with recent sloughing, drainage. Last seen by Dr. Ronnald Ramp for full skin check, has been counseled angioma. 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 No hx melanoma. Notes stimulator wires go near area of concern over right neck."  Pre op performed multiple punch biopsies of skin, read as SK and AK.  PMH includes Parkinsons subthalmic stimulator in place, CKD, hyperglycemia (last HbA1c 11/2017 5.8), hx DVT/PE 2018 while traveling to [place], completed anticoagulation with Eliquis.   Review of Systems      Objective:   Physical Exam  Constitutional: He is oriented to person, place, and time.  Cardiovascular: Normal rate, regular rhythm and normal heart sounds.  Pulmonary/Chest: Effort normal and breath sounds normal.  Neurological: He is alert and oriented to person, place, and time.   MS: open wound right upper back granulating minimal slough removed remainder sutures, no cellulitis, scant drainage 9 x 5.5 cm in greatest dimensions   Assessment:     S/p excision BCC back, positive deep margin Dehiscence incision, open wound back Plan:     Would benefit from reexcision of deep margin. This represents the base of now open wound following dehiscence closure. I would not re attempt closure after next procedure.   Discussed options for wound of VAC, STSG, healing by secondary intention. Discussed donor site pain, healing, use of VAC over STSG. Although not urgent, if we delay reexcision, would start process wound healing over again.  Reviewed risk of skin graft failure, risk of VAC on his ability to move, reviewed if VAC became dislodged could lead to graft failure. Benefit for STSG would be less time to ultimate healing if good graft take. Both will leave patch like appearance depressed contour. They have decided to proceed with reexcision, skin graft. Plan right or left thigh as donor site.  Irene Limbo, MD [facility] Plastic & Reconstructive Surgery [phone], pin [phone]

## 2018-03-08 ENCOUNTER — Encounter (HOSPITAL_BASED_OUTPATIENT_CLINIC_OR_DEPARTMENT_OTHER)
Admission: RE | Admit: 2018-03-08 | Discharge: 2018-03-08 | Disposition: A | Discharge status: Home / Self Care | Payer: BC Managed Care – PPO | Source: Ambulatory Visit | Attending: Plastic Surgery | Admitting: Plastic Surgery

## 2018-03-08 DIAGNOSIS — Z01818 Encounter for other preprocedural examination: Secondary | ICD-10-CM | POA: Insufficient documentation

## 2018-03-08 LAB — BASIC METABOLIC PANEL
Anion gap: 6 (ref 5–15)
BUN: 28 mg/dL — AB (ref 8–23)
CHLORIDE: 109 mmol/L (ref 98–111)
CO2: 22 mmol/L (ref 22–32)
Calcium: 9.2 mg/dL (ref 8.9–10.3)
Creatinine, Ser: 1.74 mg/dL — ABNORMAL HIGH (ref 0.61–1.24)
GFR calc Af Amer: 45 mL/min — ABNORMAL LOW (ref >60)
GFR, EST NON AFRICAN AMERICAN: 39 mL/min — AB (ref >60)
GLUCOSE: 137 mg/dL — AB (ref 70–99)
Potassium: 5 mmol/L (ref 3.5–5.1)
Sodium: 137 mmol/L (ref 135–145)

## 2018-03-08 NOTE — Progress Notes (Signed)
Ensure Pre-Surgery drink given to patient with instructions to complete by 0945 DOS. Pt verbalized understanding.

## 2018-03-15 ENCOUNTER — Encounter (HOSPITAL_BASED_OUTPATIENT_CLINIC_OR_DEPARTMENT_OTHER): Payer: Self-pay

## 2018-03-15 ENCOUNTER — Ambulatory Visit (HOSPITAL_BASED_OUTPATIENT_CLINIC_OR_DEPARTMENT_OTHER): Payer: BC Managed Care – PPO | Admitting: Anesthesiology

## 2018-03-15 ENCOUNTER — Ambulatory Visit (HOSPITAL_BASED_OUTPATIENT_CLINIC_OR_DEPARTMENT_OTHER)
Admission: RE | Admit: 2018-03-15 | Discharge: 2018-03-15 | Disposition: A | Discharge status: Home / Self Care | Payer: BC Managed Care – PPO | Source: Ambulatory Visit | Attending: Plastic Surgery | Admitting: Plastic Surgery

## 2018-03-15 ENCOUNTER — Other Ambulatory Visit: Payer: Self-pay

## 2018-03-15 ENCOUNTER — Encounter (HOSPITAL_BASED_OUTPATIENT_CLINIC_OR_DEPARTMENT_OTHER)
Admission: RE | Disposition: A | Discharge status: Home / Self Care | Payer: Self-pay | Source: Ambulatory Visit | Attending: Plastic Surgery

## 2018-03-15 DIAGNOSIS — G20 Parkinson's disease: Secondary | ICD-10-CM | POA: Insufficient documentation

## 2018-03-15 DIAGNOSIS — K219 Gastro-esophageal reflux disease without esophagitis: Secondary | ICD-10-CM | POA: Diagnosis not present

## 2018-03-15 DIAGNOSIS — C44519 Basal cell carcinoma of skin of other part of trunk: Secondary | ICD-10-CM | POA: Insufficient documentation

## 2018-03-15 DIAGNOSIS — Z86718 Personal history of other venous thrombosis and embolism: Secondary | ICD-10-CM | POA: Diagnosis not present

## 2018-03-15 DIAGNOSIS — N189 Chronic kidney disease, unspecified: Secondary | ICD-10-CM | POA: Insufficient documentation

## 2018-03-15 DIAGNOSIS — Z86711 Personal history of pulmonary embolism: Secondary | ICD-10-CM | POA: Diagnosis not present

## 2018-03-15 DIAGNOSIS — I110 Hypertensive heart disease with heart failure: Secondary | ICD-10-CM | POA: Insufficient documentation

## 2018-03-15 HISTORY — PX: BASAL CELL CARCINOMA EXCISION: SHX1214

## 2018-03-15 HISTORY — PX: SKIN SPLIT GRAFT: SHX444

## 2018-03-15 SURGERY — EXCISION, CARCINOMA, BASAL CELL, WITH FROZEN SECTION EXAMINATION
Anesthesia: General | Site: Thigh

## 2018-03-15 MED ORDER — SUGAMMADEX SODIUM 200 MG/2ML IV SOLN
INTRAVENOUS | Status: DC | PRN
  Administered 2018-03-15: 200 mg via INTRAVENOUS

## 2018-03-15 MED ORDER — HYDROCODONE-ACETAMINOPHEN 5-325 MG PO TABS: 1.0000 | ORAL_TABLET | ORAL | 0 refills | Status: DC | PRN

## 2018-03-15 MED ORDER — PROPOFOL 10 MG/ML IV BOLUS
INTRAVENOUS | Status: DC | PRN
  Administered 2018-03-15: 150 mg via INTRAVENOUS

## 2018-03-15 MED ORDER — BACITRACIN ZINC 500 UNIT/GM EX OINT
TOPICAL_OINTMENT | CUTANEOUS | Status: AC
  Filled 2018-03-15: qty 28.35

## 2018-03-15 MED ORDER — CEFAZOLIN SODIUM-DEXTROSE 2-4 GM/100ML-% IV SOLN
2.0000 g | INTRAVENOUS | Status: AC
  Administered 2018-03-15: 2 g via INTRAVENOUS

## 2018-03-15 MED ORDER — FENTANYL CITRATE (PF) 100 MCG/2ML IJ SOLN
50.0000 ug | INTRAMUSCULAR | Status: DC | PRN
  Administered 2018-03-15 (×2): 50 ug via INTRAVENOUS

## 2018-03-15 MED ORDER — MEPERIDINE HCL 25 MG/ML IJ SOLN: 6.2500 mg | INTRAMUSCULAR | Status: DC | PRN

## 2018-03-15 MED ORDER — BUPIVACAINE-EPINEPHRINE 0.25% -1:200000 IJ SOLN
INTRAMUSCULAR | Status: DC | PRN
  Administered 2018-03-15: 25 mL

## 2018-03-15 MED ORDER — EPHEDRINE SULFATE-NACL 50-0.9 MG/10ML-% IV SOSY
PREFILLED_SYRINGE | INTRAVENOUS | Status: DC | PRN
  Administered 2018-03-15: 10 mg via INTRAVENOUS

## 2018-03-15 MED ORDER — LIDOCAINE 2% (20 MG/ML) 5 ML SYRINGE
INTRAMUSCULAR | Status: DC | PRN
  Administered 2018-03-15: 100 mg via INTRAVENOUS

## 2018-03-15 MED ORDER — ROCURONIUM BROMIDE 50 MG/5ML IV SOSY
PREFILLED_SYRINGE | INTRAVENOUS | Status: DC | PRN
  Administered 2018-03-15 (×2): 50 mg via INTRAVENOUS

## 2018-03-15 MED ORDER — DEXAMETHASONE SODIUM PHOSPHATE 4 MG/ML IJ SOLN
INTRAMUSCULAR | Status: DC | PRN
  Administered 2018-03-15: 10 mg via INTRAVENOUS

## 2018-03-15 MED ORDER — BUPIVACAINE-EPINEPHRINE 0.25% -1:200000 IJ SOLN
INTRAMUSCULAR | Status: AC
  Filled 2018-03-15: qty 1

## 2018-03-15 MED ORDER — CEFAZOLIN SODIUM-DEXTROSE 2-4 GM/100ML-% IV SOLN
INTRAVENOUS | Status: AC
  Filled 2018-03-15: qty 100

## 2018-03-15 MED ORDER — LIDOCAINE 2% (20 MG/ML) 5 ML SYRINGE
INTRAMUSCULAR | Status: AC
  Filled 2018-03-15: qty 5

## 2018-03-15 MED ORDER — SUGAMMADEX SODIUM 200 MG/2ML IV SOLN
INTRAVENOUS | Status: AC
  Filled 2018-03-15: qty 2

## 2018-03-15 MED ORDER — MINERAL OIL LIGHT 100 % EX OIL
TOPICAL_OIL | CUTANEOUS | Status: AC
  Filled 2018-03-15: qty 25

## 2018-03-15 MED ORDER — MIDAZOLAM HCL 2 MG/2ML IJ SOLN
INTRAMUSCULAR | Status: AC
  Filled 2018-03-15: qty 2

## 2018-03-15 MED ORDER — EPHEDRINE 5 MG/ML INJ
INTRAVENOUS | Status: AC
  Filled 2018-03-15: qty 10

## 2018-03-15 MED ORDER — FENTANYL CITRATE (PF) 100 MCG/2ML IJ SOLN
INTRAMUSCULAR | Status: AC
  Filled 2018-03-15: qty 2

## 2018-03-15 MED ORDER — LACTATED RINGERS IV SOLN
INTRAVENOUS | Status: DC
  Administered 2018-03-15: 12:00:00 via INTRAVENOUS

## 2018-03-15 MED ORDER — HYDROMORPHONE HCL 1 MG/ML IJ SOLN: 0.2500 mg | INTRAMUSCULAR | Status: DC | PRN

## 2018-03-15 MED ORDER — PROPOFOL 10 MG/ML IV BOLUS
INTRAVENOUS | Status: AC
  Filled 2018-03-15: qty 20

## 2018-03-15 MED ORDER — SCOPOLAMINE 1 MG/3DAYS TD PT72: 1.0000 | MEDICATED_PATCH | Freq: Once | TRANSDERMAL | Status: DC | PRN

## 2018-03-15 MED ORDER — ONDANSETRON HCL 4 MG/2ML IJ SOLN: 4.0000 mg | Freq: Once | INTRAMUSCULAR | Status: DC | PRN

## 2018-03-15 MED ORDER — MINERAL OIL LIGHT 100 % EX OIL
TOPICAL_OIL | CUTANEOUS | Status: DC | PRN
  Administered 2018-03-15: 1 via TOPICAL

## 2018-03-15 MED ORDER — MIDAZOLAM HCL 2 MG/2ML IJ SOLN
1.0000 mg | INTRAMUSCULAR | Status: DC | PRN
  Administered 2018-03-15 (×2): 1 mg via INTRAVENOUS

## 2018-03-15 SURGICAL SUPPLY — 77 items
ADH SKN CLS APL DERMABOND .7 (GAUZE/BANDAGES/DRESSINGS)
APL SKNCLS STERI-STRIP NONHPOA (GAUZE/BANDAGES/DRESSINGS) ×2
BALL CTTN LRG ABS STRL LF (GAUZE/BANDAGES/DRESSINGS)
BANDAGE ACE 3X5.8 VEL STRL LF (GAUZE/BANDAGES/DRESSINGS) IMPLANT
BANDAGE ACE 4X5 VEL STRL LF (GAUZE/BANDAGES/DRESSINGS) IMPLANT
BANDAGE ACE 6X5 VEL STRL LF (GAUZE/BANDAGES/DRESSINGS) ×2 IMPLANT
BENZOIN TINCTURE PRP APPL 2/3 (GAUZE/BANDAGES/DRESSINGS) ×2 IMPLANT
BLADE CLIPPER SURG (BLADE) ×2 IMPLANT
BLADE DERMATOME SS (BLADE) ×2 IMPLANT
BLADE SURG 10 STRL SS (BLADE) ×2 IMPLANT
BLADE SURG 15 STRL LF DISP TIS (BLADE) ×2 IMPLANT
BLADE SURG 15 STRL SS (BLADE) ×4
BNDG COHESIVE 4X5 TAN STRL (GAUZE/BANDAGES/DRESSINGS) ×4 IMPLANT
BNDG GAUZE ELAST 4 BULKY (GAUZE/BANDAGES/DRESSINGS) ×6 IMPLANT
CANISTER SUCT 1200ML W/VALVE (MISCELLANEOUS) IMPLANT
CLOSURE WOUND 1/2 X4 (GAUZE/BANDAGES/DRESSINGS)
COTTONBALL LRG STERILE PKG (GAUZE/BANDAGES/DRESSINGS) IMPLANT
COVER BACK TABLE 60X90IN (DRAPES) ×4 IMPLANT
COVER MAYO STAND STRL (DRAPES) ×2 IMPLANT
COVER WAND RF STERILE (DRAPES) ×2 IMPLANT
DECANTER SPIKE VIAL GLASS SM (MISCELLANEOUS) IMPLANT
DERMABOND ADVANCED (GAUZE/BANDAGES/DRESSINGS)
DERMABOND ADVANCED .7 DNX12 (GAUZE/BANDAGES/DRESSINGS) IMPLANT
DERMACARRIERS GRAFT 1 TO 1.5 (DISPOSABLE) ×4
DRAPE LAPAROTOMY 100X72 PEDS (DRAPES) ×2 IMPLANT
DRAPE SURG 17X23 STRL (DRAPES) IMPLANT
DRAPE U-SHAPE 76X120 STRL (DRAPES) ×2 IMPLANT
DRSG ADAPTIC 3X8 NADH LF (GAUZE/BANDAGES/DRESSINGS) IMPLANT
DRSG EMULSION OIL 3X3 NADH (GAUZE/BANDAGES/DRESSINGS) ×2 IMPLANT
DRSG PAD ABDOMINAL 8X10 ST (GAUZE/BANDAGES/DRESSINGS) ×6 IMPLANT
ELECT COATED BLADE 2.86 ST (ELECTRODE) ×2 IMPLANT
ELECT NDL BLADE 2-5/6 (NEEDLE) IMPLANT
ELECT NEEDLE BLADE 2-5/6 (NEEDLE) ×4 IMPLANT
ELECT REM PT RETURN 9FT ADLT (ELECTROSURGICAL) ×4
ELECTRODE REM PT RTRN 9FT ADLT (ELECTROSURGICAL) IMPLANT
GAUZE SPONGE 4X4 12PLY STRL (GAUZE/BANDAGES/DRESSINGS) IMPLANT
GAUZE SPONGE 4X4 12PLY STRL LF (GAUZE/BANDAGES/DRESSINGS) IMPLANT
GAUZE XEROFORM 1X8 LF (GAUZE/BANDAGES/DRESSINGS) IMPLANT
GAUZE XEROFORM 5X9 LF (GAUZE/BANDAGES/DRESSINGS) ×2 IMPLANT
GLOVE BIO SURGEON STRL SZ 6 (GLOVE) ×4 IMPLANT
GOWN STRL REUS W/ TWL LRG LVL3 (GOWN DISPOSABLE) ×4 IMPLANT
GOWN STRL REUS W/TWL LRG LVL3 (GOWN DISPOSABLE) ×8
GRAFT DERMACARRIERS 1 TO 1.5 (DISPOSABLE) IMPLANT
HYDROGEN PEROXIDE 16OZ (MISCELLANEOUS) ×4 IMPLANT
NDL HYPO 25X1 1.5 SAFETY (NEEDLE) ×2 IMPLANT
NEEDLE HYPO 25X1 1.5 SAFETY (NEEDLE) ×4 IMPLANT
NS IRRIG 1000ML POUR BTL (IV SOLUTION) ×2 IMPLANT
PACK BASIN DAY SURGERY FS (CUSTOM PROCEDURE TRAY) ×4 IMPLANT
PAD CAST 3X4 CTTN HI CHSV (CAST SUPPLIES) IMPLANT
PAD CAST 4YDX4 CTTN HI CHSV (CAST SUPPLIES) IMPLANT
PADDING CAST COTTON 3X4 STRL (CAST SUPPLIES)
PADDING CAST COTTON 4X4 STRL (CAST SUPPLIES) ×4
PENCIL BUTTON HOLSTER BLD 10FT (ELECTRODE) ×2 IMPLANT
SHEET MEDIUM DRAPE 40X70 STRL (DRAPES) ×4 IMPLANT
SPONGE LAP 18X18 RF (DISPOSABLE) ×6 IMPLANT
STAPLER VISISTAT 35W (STAPLE) ×4 IMPLANT
STOCKINETTE 4X48 STRL (DRAPES) IMPLANT
STOCKINETTE 6  STRL (DRAPES)
STOCKINETTE 6 STRL (DRAPES) IMPLANT
STOCKINETTE IMPERVIOUS LG (DRAPES) IMPLANT
STRIP CLOSURE SKIN 1/2X4 (GAUZE/BANDAGES/DRESSINGS) IMPLANT
SURGILUBE 2OZ TUBE FLIPTOP (MISCELLANEOUS) IMPLANT
SUT CHROMIC 4 0 PS 2 18 (SUTURE) ×4 IMPLANT
SUT CHROMIC 5 0 P 3 (SUTURE) IMPLANT
SUT MNCRL AB 4-0 PS2 18 (SUTURE) IMPLANT
SUT SILK 3 0 SH CR/8 (SUTURE) IMPLANT
SUT SILK 4 0 PS 2 (SUTURE) ×2 IMPLANT
SUT VIC AB 5-0 P-3 18X BRD (SUTURE) IMPLANT
SUT VIC AB 5-0 P3 18 (SUTURE)
SYR BULB 3OZ (MISCELLANEOUS) IMPLANT
SYR CONTROL 10ML LL (SYRINGE) ×4 IMPLANT
TOWEL GREEN STERILE FF (TOWEL DISPOSABLE) ×4 IMPLANT
TRAY DSU PREP LF (CUSTOM PROCEDURE TRAY) ×4 IMPLANT
TUBE CONNECTING 20'X1/4 (TUBING) ×1
TUBE CONNECTING 20X1/4 (TUBING) ×1 IMPLANT
UNDERPAD 30X30 (UNDERPADS AND DIAPERS) ×4 IMPLANT
YANKAUER SUCT BULB TIP NO VENT (SUCTIONS) IMPLANT

## 2018-03-15 NOTE — Op Note (Signed)
Operative Note   DATE OF OPERATION: 11.25.19  LOCATION: [facility]-outpatient  SURGICAL DIVISION: Plastic Surgery  PREOPERATIVE DIAGNOSES:  1. Basal cell carcinoma back 2. Open wound back  POSTOPERATIVE DIAGNOSES:  same  PROCEDURE:  1. Excision malignant lesion back 5 cm 2. Split thickness skin graft from right thigh to back 25 cm2  SURGEON: Irene Limbo MD MBA  ASSISTANT: none  ANESTHESIA:  General.   EBL: 30 ml  COMPLICATIONS: None immediate.   INDICATIONS FOR PROCEDURE:  The patient, John Herring, is a 67 y.o. male born on January 13, 1951, is here for reexcision back lesion for margins following excision basal cell carcinoma with positive deep margin.   FINDINGS: 100% granulated back wound.   DESCRIPTION OF PROCEDURE:  The patient's operative site was marked with the patient in the preoperative area. The patient was taken to the operating room. SCDs were placed and IV antibiotics were given. The patient was placed in left lateral position and operative sites were prepped and draped in a sterile fashion. A time out was performed and all information was confirmed to be correct.Local anesthetic infiltrated surrounding back wound and donor site thigh. Sharp excision back wound and additional deep margin which represented largely deltoid muscle completed, diameter 5 cm. Hemostasis ensured.   Split thickness autograft obtained from right thigh at 12/1000th inch and meshed in 1:1.5 ratio. Graft inset to back wound with 4-0 chromic. Adpatic applied over grafted area followed by VAC sponge as bolster. VAC set to 125 mm hg continuous.  Right thigh dressed with Xeroform over donor site followed by ABD, kerlix and Ace wrap. Patient returned to supine position.  The patient was allowed to wake from anesthesia, extubated and taken to the recovery room in satisfactory condition.   SPECIMENS: re excision back basal cell carcinoma  DRAINS: none  Irene Limbo, MD [facility] Plastic &  Reconstructive Surgery [phone], pin [phone]

## 2018-03-15 NOTE — Anesthesia Procedure Notes (Signed)
Procedure Name: Intubation Date/Time: 03/15/2018 1:07 PM Performed by: Lyndee Leo, CRNA Pre-anesthesia Checklist: Patient identified, Emergency Drugs available, Suction available and Patient being monitored Patient Re-evaluated:Patient Re-evaluated prior to induction Oxygen Delivery Method: Circle system utilized Preoxygenation: Pre-oxygenation with 100% oxygen Induction Type: IV induction Ventilation: Mask ventilation without difficulty Laryngoscope Size: Mac and 4 Grade View: Grade I Tube type: Oral Tube size: 8.0 mm Number of attempts: 1 Airway Equipment and Method: Stylet and Oral airway Placement Confirmation: ETT inserted through vocal cords under direct vision,  positive ETCO2 and breath sounds checked- equal and bilateral Tube secured with: Tape Dental Injury: Teeth and Oropharynx as per pre-operative assessment

## 2018-03-15 NOTE — Anesthesia Postprocedure Evaluation (Signed)
Anesthesia Post Note  Patient: JAELEN SOTH  Procedure(s) Performed: RE EXCISION BACK LESION (N/A Back) SKIN GRAFT FROM RIGHT OR LEFT THIGH (Left Thigh)     Patient location during evaluation: PACU Anesthesia Type: General Level of consciousness: awake and alert Pain management: pain level controlled Vital Signs Assessment: post-procedure vital signs reviewed and stable Respiratory status: spontaneous breathing, nonlabored ventilation, respiratory function stable and patient connected to nasal cannula oxygen Cardiovascular status: blood pressure returned to baseline and stable Postop Assessment: no apparent nausea or vomiting Anesthetic complications: no    Last Vitals:  Vitals:   03/15/18 1415 03/15/18 1430  BP: 138/82 139/88  Pulse: 86 82  Resp: 14 (!) 23  Temp: 36.6 C   SpO2: 100% 100%    Last Pain:  Vitals:   03/15/18 1415  TempSrc:   PainSc: [place] DAVID

## 2018-03-15 NOTE — Transfer of Care (Signed)
Immediate Anesthesia Transfer of Care Note  Patient: John Herring  Procedure(s) Performed: RE EXCISION BACK LESION (N/A Back) SKIN GRAFT FROM RIGHT OR LEFT THIGH (Left Thigh)  Patient Location: PACU  Anesthesia Type:General  Level of Consciousness: awake, sedated and patient cooperative  Airway & Oxygen Therapy: Patient Spontanous Breathing and Patient connected to face mask oxygen  Post-op Assessment: Report given to RN and Post -op Vital signs reviewed and stable  Post vital signs: Reviewed and stable  Last Vitals:  Vitals Value Taken Time  BP 138/82 03/15/2018  2:15 PM  Temp    Pulse 84 03/15/2018  2:18 PM  Resp 16 03/15/2018  2:18 PM  SpO2 100 % 03/15/2018  2:18 PM  Vitals shown include unvalidated device data.  Last Pain:  Vitals:   03/15/18 1155  TempSrc: Oral  PainSc: 2          Complications: No apparent anesthesia complications

## 2018-03-15 NOTE — Interval H&P Note (Signed)
History and Physical Interval Note:  03/15/2018 11:31 AM  John Herring  has presented today for surgery, with the diagnosis of basal cell carcinoma back  The various methods of treatment have been discussed with the patient and family. After consideration of risks, benefits and other options for treatment, the patient has consented to  Procedure(s): RE EXCISION BACK LESION (N/A) SKIN GRAFT FROM RIGHT OR LEFT THIGH (Left) to back as a surgical intervention .  The patient's history has been reviewed, patient examined, no change in status, stable for surgery.  I have reviewed the patient's chart and labs.  Questions were answered to the patient's satisfaction.     Arnoldo Hooker [place] Tapp

## 2018-03-15 NOTE — Anesthesia Preprocedure Evaluation (Signed)
Anesthesia Evaluation  Patient identified by MRN, date of birth, ID band Patient awake    Reviewed: Allergy & Precautions, NPO status , Patient's Chart, lab work & pertinent test results  Airway Mallampati: II  TM Distance: >3 FB Neck ROM: Full    Dental   Pulmonary    Pulmonary exam normal        Cardiovascular Normal cardiovascular exam     Neuro/Psych Depression Parkinsonism s/p deep brain stimulator    GI/Hepatic GERD  Medicated and Controlled,  Endo/Other    Renal/GU      Musculoskeletal   Abdominal   Peds  Hematology   Anesthesia Other Findings   Reproductive/Obstetrics                             Anesthesia Physical Anesthesia Plan  ASA: III  Anesthesia Plan: General   Post-op Pain Management:    Induction: Intravenous  PONV Risk Score and Plan: 2 and Ondansetron and Midazolam  Airway Management Planned: Oral ETT  Additional Equipment:   Intra-op Plan:   Post-operative Plan: Extubation in OR  Informed Consent: I have reviewed the patients History and Physical, chart, labs and discussed the procedure including the risks, benefits and alternatives for the proposed anesthesia with the patient or authorized representative who has indicated his/her understanding and acceptance.     Plan Discussed with: CRNA and Surgeon  Anesthesia Plan Comments:         Anesthesia Quick Evaluation

## 2018-03-15 NOTE — Discharge Instructions (Signed)

## 2018-03-16 ENCOUNTER — Encounter (HOSPITAL_BASED_OUTPATIENT_CLINIC_OR_DEPARTMENT_OTHER): Payer: Self-pay | Admitting: Plastic Surgery

## 2018-06-15 ENCOUNTER — Other Ambulatory Visit: Payer: Self-pay | Admitting: Internal Medicine

## 2018-06-23 ENCOUNTER — Ambulatory Visit (INDEPENDENT_AMBULATORY_CARE_PROVIDER_SITE_OTHER)
Admission: RE | Admit: 2018-06-23 | Discharge: 2018-06-23 | Disposition: A | Discharge status: Home / Self Care | Payer: BC Managed Care – PPO | Source: Ambulatory Visit | Attending: Internal Medicine | Admitting: Internal Medicine

## 2018-06-23 ENCOUNTER — Ambulatory Visit (INDEPENDENT_AMBULATORY_CARE_PROVIDER_SITE_OTHER): Payer: BC Managed Care – PPO | Admitting: Internal Medicine

## 2018-06-23 ENCOUNTER — Encounter: Payer: Self-pay | Admitting: Internal Medicine

## 2018-06-23 ENCOUNTER — Other Ambulatory Visit (INDEPENDENT_AMBULATORY_CARE_PROVIDER_SITE_OTHER): Payer: BC Managed Care – PPO

## 2018-06-23 VITALS — BP 112/80 | HR 45 | Ht 71.0 in | Wt 187.0 lb

## 2018-06-23 DIAGNOSIS — Z0000 Encounter for general adult medical examination without abnormal findings: Secondary | ICD-10-CM | POA: Diagnosis not present

## 2018-06-23 DIAGNOSIS — R0789 Other chest pain: Secondary | ICD-10-CM | POA: Diagnosis not present

## 2018-06-23 DIAGNOSIS — R739 Hyperglycemia, unspecified: Secondary | ICD-10-CM

## 2018-06-23 DIAGNOSIS — R972 Elevated prostate specific antigen [PSA]: Secondary | ICD-10-CM

## 2018-06-23 DIAGNOSIS — N182 Chronic kidney disease, stage 2 (mild): Secondary | ICD-10-CM | POA: Diagnosis not present

## 2018-06-23 LAB — BASIC METABOLIC PANEL
BUN: 40 mg/dL — AB (ref 6–23)
CO2: 25 mEq/L (ref 19–32)
CREATININE: 1.96 mg/dL — AB (ref 0.40–1.50)
Calcium: 9.5 mg/dL (ref 8.4–10.5)
Chloride: 106 mEq/L (ref 96–112)
GFR: 34.27 mL/min — ABNORMAL LOW (ref >60.00)
GLUCOSE: 106 mg/dL — AB (ref 70–99)
Potassium: 4.3 mEq/L (ref 3.5–5.1)
Sodium: 140 mEq/L (ref 135–145)

## 2018-06-23 LAB — CBC WITH DIFFERENTIAL/PLATELET
Basophils Absolute: 0.1 10*3/uL (ref 0.0–0.1)
Basophils Relative: 1.4 % (ref 0.0–3.0)
Eosinophils Absolute: 0.1 10*3/uL (ref 0.0–0.7)
Eosinophils Relative: 0.9 % (ref 0.0–5.0)
HCT: 41.1 % (ref 39.0–52.0)
HEMOGLOBIN: 14 g/dL (ref 13.0–17.0)
Lymphocytes Relative: 23.5 % (ref 12.0–46.0)
Lymphs Abs: 1.8 10*3/uL (ref 0.7–4.0)
MCHC: 34 g/dL (ref 30.0–36.0)
MCV: 91.2 fl (ref 78.0–100.0)
MONOS PCT: 6.7 % (ref 3.0–12.0)
Monocytes Absolute: 0.5 10*3/uL (ref 0.1–1.0)
Neutro Abs: 5.2 10*3/uL (ref 1.4–7.7)
Neutrophils Relative %: 67.5 % (ref 43.0–77.0)
Platelets: 248 10*3/uL (ref 150.0–400.0)
RBC: 4.51 Mil/uL (ref 4.22–5.81)
RDW: 14.9 % (ref 11.5–15.5)
WBC: 7.7 10*3/uL (ref 4.0–10.5)

## 2018-06-23 LAB — TSH: TSH: 2.21 u[IU]/mL (ref 0.35–4.50)

## 2018-06-23 LAB — HEPATIC FUNCTION PANEL
ALK PHOS: 130 U/L — AB (ref 39–117)
ALT: 4 U/L (ref 0–53)
AST: 10 U/L (ref 0–37)
Albumin: 4.4 g/dL (ref 3.5–5.2)
Bilirubin, Direct: 0.1 mg/dL (ref 0.0–0.3)
Total Bilirubin: 0.3 mg/dL (ref 0.2–1.2)
Total Protein: 6.8 g/dL (ref 6.0–8.3)

## 2018-06-23 LAB — LIPID PANEL
Cholesterol: 220 mg/dL — ABNORMAL HIGH (ref 0–200)
HDL: 38.4 mg/dL — ABNORMAL LOW (ref >39.00)
NONHDL: 181.81
Total CHOL/HDL Ratio: 6
Triglycerides: 336 mg/dL — ABNORMAL HIGH (ref 0.0–149.0)
VLDL: 67.2 mg/dL — ABNORMAL HIGH (ref 0.0–40.0)

## 2018-06-23 LAB — HEMOGLOBIN A1C: Hgb A1c MFr Bld: 5.7 % (ref 4.6–6.5)

## 2018-06-23 LAB — LDL CHOLESTEROL, DIRECT: Direct LDL: 154 mg/dL

## 2018-06-23 LAB — PSA: PSA: 4.42 ng/mL — ABNORMAL HIGH (ref 0.10–4.00)

## 2018-06-23 NOTE — Patient Instructions (Signed)
Please continue all other medications as before, and refills have been done if requested.  Please have the pharmacy call with any other refills you may need.  Please continue your efforts at being more active, low cholesterol diet, and weight control.  You are otherwise up to date with prevention measures today.  Please keep your appointments with your specialists as you may have planned  Please go to the XRAY Department in the Basement (go straight as you get off the elevator) for the x-ray testing  Please go to the LAB in the Basement (turn left off the elevator) for the tests to be done today  You will be contacted by phone if any changes need to be made immediately.  Otherwise, you will receive a letter about your results with an explanation, but please check with MyChart first.  Please remember to sign up for MyChart if you have not done so, as this will be important to you in the future with finding out test results, communicating by private email, and scheduling acute appointments online when needed.  Please return in 6 months, or sooner if needed 

## 2018-06-23 NOTE — Assessment & Plan Note (Signed)
stable overall by history and exam, recent data reviewed with pt, and pt to continue medical treatment as before,  to f/u any worsening symptoms or concerns  

## 2018-06-23 NOTE — Assessment & Plan Note (Addendum)
Cant r/o rib fx, for cxr, rib films,  to f/u any worsening symptoms or concerns  In addition to the time spent performing CPE, I spent an additional 25 minutes face to face,in which greater than 50% of this time was spent in counseling and coordination of care for patient's acute illness as documented, including the differential dx, treatment, further evaluation and other management of chest pain, hyperglycemia, CKD, elevated psa

## 2018-06-23 NOTE — Progress Notes (Signed)
Subjective:    Patient ID: John Herring, male    DOB: Apr 10, 1951, 68 y.o.   MRN: [id number]  HPI  Here for wellness and f/u;  Overall doing ok;  Pt denies worsening SOB, DOE, wheezing, orthopnea, PND, worsening LE edema, palpitations, dizziness or syncope.  Pt denies neurological change such as new headache, facial or extremity weakness.  Pt denies polydipsia, polyuria, or low sugar symptoms. Pt states overall good compliance with treatment and medications, good tolerability, and has been trying to follow appropriate diet.  Pt denies worsening depressive symptoms, suicidal ideation or panic. No fever, night sweats, wt loss, loss of appetite, or other constitutional symptoms.  Pt states good ability with ADL's, has low fall risk, home safety reviewed and adequate, no other significant changes in hearing or vision. Also c/o left lower anterior sharp mod chest pain and tenderness without swelling or rash, + pleuritic, nothing else makes better or worse, x 3 days  Falls frequently but cannot recall a specific fall to cause this.  No hx of rib .  Denies urinary symptoms such as dysuria, frequency, urgency, flank pain, hematuria or n/v, fever, chills. Past Medical History:  Diagnosis Date  . Arthritis   . Depression 05/06/2011  . Enlarged prostate   . GERD (gastroesophageal reflux disease) 12/02/2012  . Hyperlipidemia 05/06/2011  . Hypersomnia 05/06/2011  . Hypotension   . 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 Kidney stones   . Memory loss 07/12/2012  . 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 Skin cancer 05/06/2011  . Syncope    pt has not had symptoms in 9 months   Past Surgical History:  Procedure Laterality Date  . BASAL CELL CARCINOMA EXCISION N/A 03/15/2018   Procedure: RE EXCISION BACK LESION;  Surgeon: Irene Limbo, MD;  Location: [facility];  Service: Plastics;  Laterality: N/A;  . deep brain stimulation     Parkinson's disease  . MASS EXCISION N/A 02/12/2018    Procedure: excision lesion back, layered closure 15 cm;  Surgeon: Irene Limbo, MD;  Location: [facility];  Service: Plastics;  Laterality: N/A;  . SKIN SPLIT GRAFT Left 03/15/2018   Procedure: SKIN GRAFT FROM RIGHT OR LEFT THIGH;  Surgeon: Irene Limbo, MD;  Location: [facility];  Service: Plastics;  Laterality: Left;  . SUBTHALAMIC STIMULATOR BATTERY REPLACEMENT N/A 09/01/2012   Procedure: SUBTHALAMIC STIMULATOR BATTERY REPLACEMENT;  Surgeon: Erline Levine, MD;  Location: [place] [facility];  Service: Neurosurgery;  Laterality: N/A;  Deep Brain Stimulator battery change  . SUBTHALAMIC STIMULATOR BATTERY REPLACEMENT N/A 07/19/2015   Procedure: Deep Brain stimulator battery replacement;  Surgeon: Erline Levine, MD;  Location: [facility] [facility];  Service: Neurosurgery;  Laterality: N/A;  Deep Brain stimulator battery replacement  . TONSILLECTOMY     As a child    reports that he has never smoked. He has never used smokeless tobacco. He reports that he does not drink alcohol or use drugs. family history is not on file. Allergies  Allergen Reactions  . Statins     Makes parkinson worse, blood pressure drop   Current Outpatient Medications on File Prior to Visit  Medication Sig Dispense Refill  . amantadine (SYMMETREL) 100 MG capsule Take 100 mg by mouth 3 (three) times daily.     . carbidopa-levodopa (SINEMET IR) 25-100 MG tablet Take 1 tablet by mouth 6 (six) times daily.    Marland Kitchen ezetimibe (ZETIA) 10 MG tablet TAKE 1 TABLET BY MOUTH EVERY DAY 90 tablet  1  . fluticasone (FLONASE) 50 MCG/ACT nasal spray Place into both nostrils daily.    Marland Kitchen HYDROcodone-acetaminophen (NORCO) 5-325 MG tablet Take 1 tablet by mouth every 4 (four) hours as needed for moderate pain. 20 tablet 0  . ibuprofen (ADVIL,MOTRIN) 800 MG tablet Take 800 mg by mouth every 8 (eight) hours as needed for moderate pain.     . meloxicam (MOBIC) 15 MG tablet TAKE 1 TABLET BY MOUTH EVERY DAY 90 tablet 1  .  midodrine (PROAMATINE) 10 MG tablet TAKE 1 TABLET BY MOUTH THREE TIMES A DAY 270 tablet 1  . mirabegron ER (MYRBETRIQ) 50 MG TB24 tablet Take 50 mg by mouth daily.    . sertraline (ZOLOFT) 50 MG tablet Take 50 mg by mouth daily.     No current facility-administered medications on file prior to visit.    Review of Systems Constitutional: Negative for other unusual diaphoresis, sweats, appetite or weight changes HENT: Negative for other worsening hearing loss, ear pain, facial swelling, mouth sores or neck stiffness.   Eyes: Negative for other worsening pain, redness or other visual disturbance.  Respiratory: Negative for other stridor or swelling Cardiovascular: Negative for other palpitations or other chest pain  Gastrointestinal: Negative for worsening diarrhea or loose stools, blood in stool, distention or other pain Genitourinary: Negative for hematuria, flank pain or other change in urine volume.  Musculoskeletal: Negative for myalgias or other joint swelling.  Skin: Negative for other color change, or other wound or worsening drainage.  Neurological: Negative for other syncope or numbness. Hematological: Negative for other adenopathy or swelling Psychiatric/Behavioral: Negative for hallucinations, other worsening agitation, SI, self-injury, or new decreased concentration All other system neg per pt    Objective:   Physical Exam BP 112/80 (BP Location: Left Arm, Patient Position: Sitting, Cuff Size: Large)   Pulse (!) 45   Ht 5\' 11"  (1.803 m)   Wt 187 lb (84.8 kg)   SpO2 95%   BMI 26.08 kg/m  VS noted, not ill appearing Constitutional: Pt is oriented to person, place, and time. Appears well-developed and well-nourished, in no significant distress and comfortable Head: Normocephalic and atraumatic  Eyes: Conjunctivae and EOM are normal. Pupils are equal, round, and reactive to light Right Ear: External ear normal without discharge Left Ear: External ear normal without  discharge Nose: Nose without discharge or deformity Mouth/Throat: Oropharynx is without other ulcerations and moist  Neck: Normal range of motion. Neck supple. No JVD present. No tracheal deviation present or significant neck LA or mass Cardiovascular: Normal rate, regular rhythm, normal heart sounds and intact distal pulses.   Pulmonary/Chest: WOB normal and breath sounds without rales or wheezing  Abdominal: Soft. Bowel sounds are normal. NT. No HSM  Musculoskeletal: Normal range of motion. Exhibits no edema, has tender to left mid costal margin and ribs about t10-12 along the anterior axillary line Lymphadenopathy: Has no other cervical adenopathy.  Neurological: Pt is alert and oriented to person, place, and time. Pt has normal reflexes. No cranial nerve deficit. Motor grossly intact, Gait intact Skin: Skin is warm and dry. No rash noted or new ulcerations Psychiatric:  Has normal mood and affect. Behavior is normal without agitation] No other exam findings Lab Results  Component Value Date   WBC 7.7 06/23/2018   HGB 14.0 06/23/2018   HCT 41.1 06/23/2018   PLT 248.0 06/23/2018   GLUCOSE 106 (H) 06/23/2018   CHOL 220 (H) 06/23/2018   TRIG 336.0 (H) 06/23/2018   HDL 38.40 (L)  06/23/2018   LDLDIRECT 154.0 06/23/2018   LDLCALC 154 (H) 12/08/2014   ALT 4 06/23/2018   AST 10 06/23/2018   NA 140 06/23/2018   K 4.3 06/23/2018   CL 106 06/23/2018   CREATININE 1.96 (H) 06/23/2018   BUN 40 (H) 06/23/2018   CO2 25 06/23/2018   TSH 2.21 06/23/2018   PSA 4.42 (H) 06/23/2018   HGBA1C 5.7 06/23/2018       Assessment & Plan:

## 2018-06-23 NOTE — Assessment & Plan Note (Signed)
Asympt, for f/u psa 

## 2018-06-24 ENCOUNTER — Encounter: Payer: Self-pay | Admitting: Internal Medicine

## 2018-06-24 ENCOUNTER — Ambulatory Visit: Payer: Self-pay | Admitting: Internal Medicine

## 2018-06-24 NOTE — Telephone Encounter (Signed)
John Herring with [facility] called in with report for x-ray of left ribs.  Multiple displaced and angulated fractures involving the left lateral ribs at least the fifth-tenth ribs.  I called the flow coordinator at [facility] at [facility] and made her aware of the chest x-ray results.   She asked me to send the notes over and she would see that Dr. Jenny Reichmann got the report.  These notes were sent to the office high priority.

## 2018-06-24 NOTE — Telephone Encounter (Signed)
Sent to the wrong office.

## 2018-06-25 ENCOUNTER — Encounter: Payer: Self-pay | Admitting: Internal Medicine

## 2018-08-19 ENCOUNTER — Other Ambulatory Visit: Payer: Self-pay | Admitting: Internal Medicine

## 2018-09-14 ENCOUNTER — Telehealth: Payer: Self-pay

## 2018-09-14 ENCOUNTER — Ambulatory Visit (INDEPENDENT_AMBULATORY_CARE_PROVIDER_SITE_OTHER): Payer: BC Managed Care – PPO | Admitting: Internal Medicine

## 2018-09-14 DIAGNOSIS — I951 Orthostatic hypotension: Secondary | ICD-10-CM

## 2018-09-14 DIAGNOSIS — F329 Major depressive disorder, single episode, unspecified: Secondary | ICD-10-CM

## 2018-09-14 DIAGNOSIS — F32A Depression, unspecified: Secondary | ICD-10-CM

## 2018-09-14 DIAGNOSIS — R739 Hyperglycemia, unspecified: Secondary | ICD-10-CM | POA: Diagnosis not present

## 2018-09-14 NOTE — Telephone Encounter (Signed)
Noted  

## 2018-09-14 NOTE — Telephone Encounter (Signed)
Left patient vm to call back to schedule appt  °

## 2018-09-14 NOTE — Telephone Encounter (Signed)
DOXY has been made for this evening with Dr.John @740pm 

## 2018-09-14 NOTE — Telephone Encounter (Signed)
Doxy link sent

## 2018-09-14 NOTE — Telephone Encounter (Signed)
Patient's dentist office called and reported this patient was in office today for just normal routine cleaning and his blood pressure was measured at 200/110---patient should not have been anxious about dental visit, and he normally does not trend this high---patient is asymptomatic, no SOB,chest pain, headache,dizziness, arm pain or blurred vision---dentist has recommended that patient  come see his primary care provider for bp evaluation---routing to dr Jenny Reichmann, are you ok with virutal visit or do you need patient to come into office---please advise, we will call patient later today to schedule appointment, thanks

## 2018-09-14 NOTE — Progress Notes (Signed)
Patient ID: John Herring, male   DOB: November 24, 1950, 68 y.o.   MRN: [id number]  Virtual Visit via Video Note  I connected with John Herring on 09/14/18 at  7:40 PM EDT by a video enabled telemedicine application and verified that I am speaking with the correct person using two identifiers.  Location: Patient: at home with wife present Provider: at home   I discussed the limitations of evaluation and management by telemedicine and the availability of in person appointments. The patient expressed understanding and agreed to proceed.  History of Present Illness: Here to f/u with c/o elevated BP at dentist 166/108 with f/u sbp there 235 as well, dental cleaning deferred, but will need dental surgury for 2 teeth soon; Pt denies chest pain, increased sob or doe, wheezing, orthopnea, PND, increased LE swelling, palpitations, dizziness or syncope.  Pt denies new neurological symptoms such as new headache, or facial or extremity weakness or numbness   Pt denies polydipsia, polyuria.  Has good compliance with midodrine with good results at 10 mg dosing.  Denies worsening depressive symptoms, suicidal ideation, or panic Past Medical History:  Diagnosis Date  . Arthritis   . Depression 05/06/2011  . Enlarged prostate   . GERD (gastroesophageal reflux disease) 12/02/2012  . Hyperlipidemia 05/06/2011  . Hypersomnia 05/06/2011  . Hypotension   . 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 Kidney stones   . Memory loss 07/12/2012  . 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 Skin cancer 05/06/2011  . Syncope    pt has not had symptoms in 9 months   Past Surgical History:  Procedure Laterality Date  . BASAL CELL CARCINOMA EXCISION N/A 03/15/2018   Procedure: RE EXCISION BACK LESION;  Surgeon: Irene Limbo, MD;  Location: [facility];  Service: Plastics;  Laterality: N/A;  . deep brain stimulation     Parkinson's disease  . MASS EXCISION N/A 02/12/2018   Procedure: excision lesion back,  layered closure 15 cm;  Surgeon: Irene Limbo, MD;  Location: [place];  Service: Plastics;  Laterality: N/A;  . SKIN SPLIT GRAFT Left 03/15/2018   Procedure: SKIN GRAFT FROM RIGHT OR LEFT THIGH;  Surgeon: Irene Limbo, MD;  Location: [facility];  Service: Plastics;  Laterality: Left;  . SUBTHALAMIC STIMULATOR BATTERY REPLACEMENT N/A 09/01/2012   Procedure: SUBTHALAMIC STIMULATOR BATTERY REPLACEMENT;  Surgeon: Erline Levine, MD;  Location: [place] [facility];  Service: Neurosurgery;  Laterality: N/A;  Deep Brain Stimulator battery change  . SUBTHALAMIC STIMULATOR BATTERY REPLACEMENT N/A 07/19/2015   Procedure: Deep Brain stimulator battery replacement;  Surgeon: Erline Levine, MD;  Location: [place] [facility];  Service: Neurosurgery;  Laterality: N/A;  Deep Brain stimulator battery replacement  . TONSILLECTOMY     As a child    reports that he has never smoked. He has never used smokeless tobacco. He reports that he does not drink alcohol or use drugs. family history is not on file. Allergies  Allergen Reactions  . Statins     Makes parkinson worse, blood pressure drop   Current Outpatient Medications on File Prior to Visit  Medication Sig Dispense Refill  . amantadine (SYMMETREL) 100 MG capsule Take 100 mg by mouth 3 (three) times daily.     . carbidopa-levodopa (SINEMET IR) 25-100 MG tablet Take 1 tablet by mouth 6 (six) times daily.    Marland Kitchen ezetimibe (ZETIA) 10 MG tablet TAKE 1 TABLET BY MOUTH EVERY DAY 90 tablet 1  . fluticasone (FLONASE) 50 MCG/ACT nasal  spray Place into both nostrils daily.    Marland Kitchen HYDROcodone-acetaminophen (NORCO) 5-325 MG tablet Take 1 tablet by mouth every 4 (four) hours as needed for moderate pain. 20 tablet 0  . ibuprofen (ADVIL,MOTRIN) 800 MG tablet Take 800 mg by mouth every 8 (eight) hours as needed for moderate pain.     . meloxicam (MOBIC) 15 MG tablet TAKE 1 TABLET BY MOUTH EVERY DAY 90 tablet 1  . midodrine (PROAMATINE) 10 MG tablet  TAKE 1 TABLET BY MOUTH THREE TIMES A DAY 270 tablet 1  . mirabegron ER (MYRBETRIQ) 50 MG TB24 tablet Take 50 mg by mouth daily.    . sertraline (ZOLOFT) 50 MG tablet Take 50 mg by mouth daily.     No current facility-administered medications on file prior to visit.     Observations/Objective: Alert, NAD, appropriate mood and affect, resps normal, cn 2-12 intact, moves all 4s, no visible rash or swelling Lab Results  Component Value Date   WBC 7.7 06/23/2018   HGB 14.0 06/23/2018   HCT 41.1 06/23/2018   PLT 248.0 06/23/2018   GLUCOSE 106 (H) 06/23/2018   CHOL 220 (H) 06/23/2018   TRIG 336.0 (H) 06/23/2018   HDL 38.40 (L) 06/23/2018   LDLDIRECT 154.0 06/23/2018   LDLCALC 154 (H) 12/08/2014   ALT 4 06/23/2018   AST 10 06/23/2018   NA 140 06/23/2018   K 4.3 06/23/2018   CL 106 06/23/2018   CREATININE 1.96 (H) 06/23/2018   BUN 40 (H) 06/23/2018   CO2 25 06/23/2018   TSH 2.21 06/23/2018   PSA 4.42 (H) 06/23/2018   HGBA1C 5.7 06/23/2018    Assessment and Plan: See notes  Follow Up Instructions: See notes   I discussed the assessment and treatment plan with the patient. The patient was provided an opportunity to ask questions and all were answered. The patient agreed with the plan and demonstrated an understanding of the instructions.   The patient was advised to call back or seek an in-person evaluation if the symptoms worsen or if the condition fails to improve as anticipated.  Cathlean Cower, MD

## 2018-09-14 NOTE — Telephone Encounter (Signed)
Needs in person visit please about 420 pm, or if not able, can try doxy this evening

## 2018-09-14 NOTE — Patient Instructions (Signed)
Please check the Blood Pressure three times daily for 5 days and let us know results on the Mychart Messaging  If the pressure is mostly consistently > 150-160, we most likely can reduce the midodrine to 5 mg  Please continue all other medications as before, and refills have been done if requested.  Please have the pharmacy call with any other refills you may need.  Please continue your efforts at being more active, low cholesterol diet, and weight control.  You are otherwise up to date with prevention measures today.  Please keep your appointments with your specialists as you may have planned

## 2018-09-18 ENCOUNTER — Encounter: Payer: Self-pay | Admitting: Internal Medicine

## 2018-09-18 NOTE — Assessment & Plan Note (Signed)
Possible overcontrolled, to f/u with BP checks bid for 5 days and call with results, may need to consider decreased midrodrine

## 2018-09-18 NOTE — Assessment & Plan Note (Signed)
stable overall by history and exam, recent data reviewed with pt, and pt to continue medical treatment as before,  to f/u any worsening symptoms or concerns  

## 2018-09-21 ENCOUNTER — Encounter: Payer: Self-pay | Admitting: Internal Medicine

## 2018-09-21 MED ORDER — MIDODRINE HCL 5 MG PO TABS: 5.0000 mg | ORAL_TABLET | Freq: Three times a day (TID) | ORAL | 3 refills | Status: DC

## 2018-09-30 ENCOUNTER — Encounter: Payer: Self-pay | Admitting: Internal Medicine

## 2018-12-07 ENCOUNTER — Other Ambulatory Visit: Payer: Self-pay | Admitting: Internal Medicine

## 2019-01-05 ENCOUNTER — Encounter: Payer: Self-pay | Admitting: Internal Medicine

## 2019-01-05 ENCOUNTER — Ambulatory Visit (INDEPENDENT_AMBULATORY_CARE_PROVIDER_SITE_OTHER): Payer: Medicare Other | Admitting: Internal Medicine

## 2019-01-05 ENCOUNTER — Other Ambulatory Visit: Payer: Self-pay

## 2019-01-05 VITALS — BP 120/78 | HR 73 | Temp 97.7°F | Ht 71.0 in | Wt 175.0 lb

## 2019-01-05 DIAGNOSIS — N182 Chronic kidney disease, stage 2 (mild): Secondary | ICD-10-CM | POA: Diagnosis not present

## 2019-01-05 DIAGNOSIS — R739 Hyperglycemia, unspecified: Secondary | ICD-10-CM

## 2019-01-05 DIAGNOSIS — I951 Orthostatic hypotension: Secondary | ICD-10-CM

## 2019-01-05 DIAGNOSIS — Z23 Encounter for immunization: Secondary | ICD-10-CM | POA: Diagnosis not present

## 2019-01-05 MED ORDER — MIDODRINE HCL 5 MG PO TABS: 7.5000 mg | ORAL_TABLET | Freq: Three times a day (TID) | ORAL | 3 refills | Status: DC

## 2019-01-05 NOTE — Assessment & Plan Note (Signed)
stable overall by history and exam, recent data reviewed with pt, and pt to continue medical treatment as before,  to f/u any worsening symptoms or concerns  

## 2019-01-05 NOTE — Patient Instructions (Addendum)
Ok to try to increase the midodrine to 1.5 tab at three times per day  (a new prescription has been sent)  Please continue all other medications as before, and refills have been done if requested.  Please have the pharmacy call with any other refills you may need.  Please continue your efforts at being more active, low cholesterol diet, and weight control  Please keep your appointments with your specialists as you may have planned  Please return in 6 months, or sooner if needed

## 2019-01-05 NOTE — Assessment & Plan Note (Addendum)
With recurrent falls, ok to try midodrine 7.5 tid,  to f/u any worsening symptoms or concerns  Note:  Total time for pt hx, exam, review of record with pt in the room, determination of diagnoses and plan for further eval and tx is > 40 min, with over 50% spent in coordination and counseling of patient including the differential dx, tx, further evaluation and other management of orthostatic hypotension, hyperglycemia, CKD

## 2019-01-05 NOTE — Progress Notes (Signed)
Subjective:    Patient ID: John Herring, male    DOB: July 02, 1950, 68 y.o.   MRN: [id number]  HPI  Here with wife, they report increased of recurrent falls and dizziness and may have even lost consciousness briefly several times; Midodrine 10 tid was too much with severe HTN resulting at dentist office and had much less falls, but now on 5 mg tid seems to be not enough to prevent the falls.  Pt denies chest pain, increased sob or doe, wheezing, orthopnea, PND, increased LE swelling, palpitations.  Declines other evaluation for now.  Pt denies new neurological symptoms such as new headache, or facial or extremity weakness or numbness   Pt denies polydipsia, polyuria Past Medical History:  Diagnosis Date  . Arthritis   . Depression 05/06/2011  . Enlarged prostate   . GERD (gastroesophageal reflux disease) 12/02/2012  . Hyperlipidemia 05/06/2011  . Hypersomnia 05/06/2011  . Hypotension   . 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 Kidney stones   . Memory loss 07/12/2012  . 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 Skin cancer 05/06/2011  . Syncope    pt has not had symptoms in 9 months   Past Surgical History:  Procedure Laterality Date  . BASAL CELL CARCINOMA EXCISION N/A 03/15/2018   Procedure: RE EXCISION BACK LESION;  Surgeon: Irene Limbo, MD;  Location: [place];  Service: Plastics;  Laterality: N/A;  . deep brain stimulation     Parkinson's disease  . MASS EXCISION N/A 02/12/2018   Procedure: excision lesion back, layered closure 15 cm;  Surgeon: Irene Limbo, MD;  Location: [place];  Service: Plastics;  Laterality: N/A;  . SKIN SPLIT GRAFT Left 03/15/2018   Procedure: SKIN GRAFT FROM RIGHT OR LEFT THIGH;  Surgeon: Irene Limbo, MD;  Location: [place];  Service: Plastics;  Laterality: Left;  . SUBTHALAMIC STIMULATOR BATTERY REPLACEMENT N/A 09/01/2012   Procedure: SUBTHALAMIC STIMULATOR BATTERY REPLACEMENT;  Surgeon:  Erline Levine, MD;  Location: [facility];  Service: Neurosurgery;  Laterality: N/A;  Deep Brain Stimulator battery change  . SUBTHALAMIC STIMULATOR BATTERY REPLACEMENT N/A 07/19/2015   Procedure: Deep Brain stimulator battery replacement;  Surgeon: Erline Levine, MD;  Location: [facility];  Service: Neurosurgery;  Laterality: N/A;  Deep Brain stimulator battery replacement  . TONSILLECTOMY     As a child    reports that he has never smoked. He has never used smokeless tobacco. He reports that he does not drink alcohol or use drugs. family history is not on file. Allergies  Allergen Reactions  . Statins     Makes parkinson worse, blood pressure drop   Current Outpatient Medications on File Prior to Visit  Medication Sig Dispense Refill  . amantadine (SYMMETREL) 100 MG capsule Take 100 mg by mouth 3 (three) times daily.     . carbidopa-levodopa (SINEMET IR) 25-100 MG tablet Take 1 tablet by mouth 6 (six) times daily.    Marland Kitchen ezetimibe (ZETIA) 10 MG tablet TAKE 1 TABLET BY MOUTH EVERY DAY 90 tablet 1  . fluticasone (FLONASE) 50 MCG/ACT nasal spray Place into both nostrils daily.    Marland Kitchen HYDROcodone-acetaminophen (NORCO) 5-325 MG tablet Take 1 tablet by mouth every 4 (four) hours as needed for moderate pain. 20 tablet 0  . ibuprofen (ADVIL,MOTRIN) 800 MG tablet Take 800 mg by mouth every 8 (eight) hours as needed for moderate pain.     . meloxicam (MOBIC) 15 MG tablet TAKE 1 TABLET BY  MOUTH EVERY DAY 90 tablet 1  . mirabegron ER (MYRBETRIQ) 50 MG TB24 tablet Take 50 mg by mouth daily.    . sertraline (ZOLOFT) 50 MG tablet Take 50 mg by mouth daily.     No current facility-administered medications on file prior to visit.    Review of Systems  Constitutional: Negative for other unusual diaphoresis or sweats HENT: Negative for ear discharge or swelling Eyes: Negative for other worsening visual disturbances Respiratory: Negative for stridor or other swelling  Gastrointestinal: Negative for  worsening distension or other blood Genitourinary: Negative for retention or other urinary change Musculoskeletal: Negative for other MSK pain or swelling Skin: Negative for color change or other new lesions Neurological: Negative for worsening tremors and other numbness  Psychiatric/Behavioral: Negative for worsening agitation or other fatigue All other system neg per pt    Objective:   Physical Exam BP 120/78   Pulse 73   Temp 97.7 F (36.5 C) (Oral)   Ht 5\' 11"  (1.803 m)   Wt 175 lb (79.4 kg)   SpO2 97%   BMI 24.41 kg/m  VS noted,  Constitutional: Pt appears in NAD HENT: Head: NCAT.  Right Ear: External ear normal.   Left Ear: External ear normal.  Eyes: . Pupils are equal, round, and reactive to light. Conjunctivae and EOM are normal Nose: without d/c or deformity Neck: Neck supple. Gross normal ROM Cardiovascular: Normal rate and regular rhythm.   Pulmonary/Chest: Effort normal and breath sounds without rales or wheezing.  Abd:  Soft, NT, ND, + BS, no organomegaly Neurological: Pt is alert. At baseline orientation, has typical PD moveements Skin: Skin is warm. No rashes, other new lesions, no LE edema Psychiatric: Pt behavior is normal without agitation  No other exam findings Lab Results  Component Value Date   WBC 7.7 06/23/2018   HGB 14.0 06/23/2018   HCT 41.1 06/23/2018   PLT 248.0 06/23/2018   GLUCOSE 106 (H) 06/23/2018   CHOL 220 (H) 06/23/2018   TRIG 336.0 (H) 06/23/2018   HDL 38.40 (L) 06/23/2018   LDLDIRECT 154.0 06/23/2018   LDLCALC 154 (H) 12/08/2014   ALT 4 06/23/2018   AST 10 06/23/2018   NA 140 06/23/2018   K 4.3 06/23/2018   CL 106 06/23/2018   CREATININE 1.96 (H) 06/23/2018   BUN 40 (H) 06/23/2018   CO2 25 06/23/2018   TSH 2.21 06/23/2018   PSA 4.42 (H) 06/23/2018   HGBA1C 5.7 06/23/2018   Declines further lab today    Assessment & Plan:

## 2019-01-10 ENCOUNTER — Other Ambulatory Visit: Payer: Self-pay | Admitting: Internal Medicine

## 2019-01-10 MED ORDER — MIDODRINE HCL 5 MG PO TABS: 7.5000 mg | ORAL_TABLET | Freq: Three times a day (TID) | ORAL | 3 refills | Status: DC

## 2019-01-10 NOTE — Telephone Encounter (Signed)
Requested medication (s) are due for refill today: yes  Requested medication (s) are on the active medication list: yes  Last refill:  01/05/2019  Future visit scheduled: yes  Notes to clinic: refill cannot be delegated Patient states pharmacy didn't receive medication. Patient would like a call once this has been resent   Requested Prescriptions  Pending Prescriptions Disp Refills   midodrine (PROAMATINE) 5 MG tablet 405 tablet 3    Sig: Take 1.5 tablets (7.5 mg total) by mouth 3 (three) times daily with meals.     Not Delegated - Cardiovascular: Midodrine Failed - 01/10/2019  2:42 PM      Failed - This refill cannot be delegated      Passed - Last BP in normal range    BP Readings from Last 1 Encounters:  01/05/19 120/78         Passed - Valid encounter within last 12 months    Recent Outpatient Visits          5 days ago Flu vaccine need   [place] John, James W, MD   3 months ago Orthostatic hypotension   [place] John, Hunt Oris, MD   6 months ago Preventative health care   [facility] -Georges Mouse, MD   1 year ago Hyperglycemia   [place], James W, MD   1 year ago Preventative health care   [facility] -Georges Mouse, MD      Future Appointments            In 5 months Jenny Reichmann, Hunt Oris, MD [place], [facility]

## 2019-01-10 NOTE — Telephone Encounter (Signed)
Patient's wife stated that the [facility] in [place] does not have the patient's midodrine (PROAMATINE) 5 MG tablet medication even though it shows confirmed in [place].  Please resubmit, and call when prescription is submitted.

## 2019-01-13 ENCOUNTER — Other Ambulatory Visit: Payer: Self-pay

## 2019-01-13 ENCOUNTER — Emergency Department (HOSPITAL_COMMUNITY): Payer: Medicare Other

## 2019-01-13 ENCOUNTER — Emergency Department (HOSPITAL_COMMUNITY)
Admission: EM | Admit: 2019-01-13 | Discharge: 2019-01-13 | Disposition: A | Discharge status: Home / Self Care | Payer: Medicare Other | Attending: Emergency Medicine | Admitting: Emergency Medicine

## 2019-01-13 DIAGNOSIS — Z85828 Personal history of other malignant neoplasm of skin: Secondary | ICD-10-CM | POA: Diagnosis not present

## 2019-01-13 DIAGNOSIS — Y939 Activity, unspecified: Secondary | ICD-10-CM | POA: Diagnosis not present

## 2019-01-13 DIAGNOSIS — S2242XA Multiple fractures of ribs, left side, initial encounter for closed fracture: Secondary | ICD-10-CM | POA: Diagnosis not present

## 2019-01-13 DIAGNOSIS — G20 Parkinson's disease: Secondary | ICD-10-CM | POA: Insufficient documentation

## 2019-01-13 DIAGNOSIS — Y929 Unspecified place or not applicable: Secondary | ICD-10-CM | POA: Diagnosis not present

## 2019-01-13 DIAGNOSIS — W19XXXA Unspecified fall, initial encounter: Secondary | ICD-10-CM | POA: Insufficient documentation

## 2019-01-13 DIAGNOSIS — I2699 Other pulmonary embolism without acute cor pulmonale: Secondary | ICD-10-CM | POA: Insufficient documentation

## 2019-01-13 DIAGNOSIS — R0789 Other chest pain: Secondary | ICD-10-CM | POA: Insufficient documentation

## 2019-01-13 DIAGNOSIS — N189 Chronic kidney disease, unspecified: Secondary | ICD-10-CM | POA: Insufficient documentation

## 2019-01-13 DIAGNOSIS — Y999 Unspecified external cause status: Secondary | ICD-10-CM | POA: Insufficient documentation

## 2019-01-13 LAB — BASIC METABOLIC PANEL
Anion gap: 10 (ref 5–15)
BUN: 30 mg/dL — ABNORMAL HIGH (ref 8–23)
CO2: 23 mmol/L (ref 22–32)
Calcium: 9.3 mg/dL (ref 8.9–10.3)
Chloride: 106 mmol/L (ref 98–111)
Creatinine, Ser: 1.53 mg/dL — ABNORMAL HIGH (ref 0.61–1.24)
GFR calc Af Amer: 54 mL/min — ABNORMAL LOW (ref >60)
GFR calc non Af Amer: 46 mL/min — ABNORMAL LOW (ref >60)
Glucose, Bld: 99 mg/dL (ref 70–99)
Potassium: 4.7 mmol/L (ref 3.5–5.1)
Sodium: 139 mmol/L (ref 135–145)

## 2019-01-13 LAB — CBC
HCT: 40.8 % (ref 39.0–52.0)
Hemoglobin: 14 g/dL (ref 13.0–17.0)
MCH: 32.4 pg (ref 26.0–34.0)
MCHC: 34.3 g/dL (ref 30.0–36.0)
MCV: 94.4 fL (ref 80.0–100.0)
Platelets: 208 10*3/uL (ref 150–400)
RBC: 4.32 MIL/uL (ref 4.22–5.81)
RDW: 13.6 % (ref 11.5–15.5)
WBC: 6.3 10*3/uL (ref 4.0–10.5)
nRBC: 0 % (ref 0.0–0.2)

## 2019-01-13 LAB — D-DIMER, QUANTITATIVE: D-Dimer, Quant: 2.02 ug/mL-FEU — ABNORMAL HIGH (ref 0.00–0.50)

## 2019-01-13 LAB — TROPONIN I (HIGH SENSITIVITY)
Troponin I (High Sensitivity): 5 ng/L (ref <18)
Troponin I (High Sensitivity): 5 ng/L (ref <18)

## 2019-01-13 MED ORDER — CARBIDOPA-LEVODOPA 25-100 MG PO TABS
1.0000 | ORAL_TABLET | Freq: Every day | ORAL | Status: DC
  Administered 2019-01-13 (×2): 1 via ORAL
  Filled 2019-01-13 (×3): qty 1

## 2019-01-13 MED ORDER — NITROGLYCERIN 0.4 MG SL SUBL
0.4000 mg | SUBLINGUAL_TABLET | SUBLINGUAL | Status: DC | PRN
  Administered 2019-01-13: 11:00:00 0.4 mg via SUBLINGUAL
  Filled 2019-01-13 (×2): qty 1

## 2019-01-13 MED ORDER — IOHEXOL 350 MG/ML SOLN
100.0000 mL | Freq: Once | INTRAVENOUS | Status: AC | PRN
  Administered 2019-01-13: 100 mL via INTRAVENOUS

## 2019-01-13 MED ORDER — SODIUM CHLORIDE 0.9 % IV BOLUS
1000.0000 mL | Freq: Once | INTRAVENOUS | Status: AC
  Administered 2019-01-13: 13:00:00 1000 mL via INTRAVENOUS

## 2019-01-13 NOTE — Discharge Instructions (Signed)
Please follow up with the cardiology office this week.

## 2019-01-13 NOTE — ED Triage Notes (Addendum)
Patient in via [facility] from home c/o new onset chest pressure upon waking at 0830 today. He endorses shortness of breath and dizziness along with it, rated initially a 5/10. Given 324 ASA PTA with some relief, states now 3/10. EMS 12-lead NSR with some PVCs. He also reports generalized weakness for a few days. Hx of Parkinson's disease - has DBS (states batteries ran out 2-3 days ago). Resp e/u, skin w/d. EMS VS: 157/100, HR 80, RR 18, 98% RA.   Also reports hx hypotension that he takes midodrine for - took about 1 hour ago,

## 2019-01-13 NOTE — ED Notes (Signed)
Family at bedside reported scheduled carbidopa/levodopa Q2.5h; and given scheduled dose from home.

## 2019-01-13 NOTE — ED Provider Notes (Signed)
[facility] EMERGENCY DEPARTMENT Provider Note   CSN: [id number] Arrival date & time: 01/13/19  1016     History   Chief Complaint Chief Complaint  Patient presents with   Chest Pain    HPI John Herring is a 68 y.o. male with a history of Parkinson's disease status post deep brain stimulator implant, hypotension on 5 mg admitted drain, PE, high cholesterol, presenting to the emergency department with chest pain.  He reports he woke up this morning around 5 or 6 AM feeling a pressure sensation in his chest.  He notes it was worse at breakfast.  His wife says he took his morning 5 mg Midrin dose.  However when he was having symptoms he checked his blood pressure and it was 180/110 which is significantly elevated for him.  He took him to the fire house.  He was given a full dose of aspirin but no sublingual nitroglycerin.  He reports his pain is improved on its own and is currently 3 out of 10.  He states he has never had pain like this before.  It is a pressure sensation in the left side of his chest that is diffuse but does not radiate anywhere.  He denies any symptoms in his left arm right arm or jaw.  He denies any nausea vomiting or diaphoresis.  He denies any personal history of heart disease or MI.  However he does report a very significant family history of MI and cardiac disease in nearly all of his kin and parents.  He reports no smoking history.  He denies any fever, cough, sick contacts in the house.  His wife reports is been keeping fairly isolated during the [place] area.  His wife reports that he does have a history of bilateral lung blood clots diagnosed approximately 2 years ago.  This occurred after a long flight on vacation.  She said she had to perform CPR on the patient.  She states he was on Eliquis for period of 6 months and then taken off the Eliquis as it was felt he no longer needed it.  Of note his deep brain stimulator ran out of batteries  approximately 3 days ago.  They have discussed this with his neurologist who started the patient on carbidopa levodopa 25-100 mg 6 times daily.  He does have some level of tremor at baseline.     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   Skin cancer 05/06/2011   Syncope    pt has not had symptoms in 9 months    Patient Active Problem List   Diagnosis Date Noted   Elevated PSA 06/23/2018   Hyperglycemia 06/18/2017   Pulmonary embolism ([place]) 12/21/2016   BPH with urinary obstruction 03/08/2016   Dizziness and giddiness 03/08/2016   Increased prostate specific antigen (PSA) velocity 06/09/2014   CKD (chronic kidney disease) 06/09/2014   Chest pain 12/06/2013   GERD (gastroesophageal reflux disease) 12/02/2012   Memory loss 07/12/2012   Orthostatic hypotension 06/12/2012   Right foot pain 06/12/2012   Alcoholism ([place]) 06/13/2011   Shoulder pain, bilateral 06/13/2011   Preventative health care 06/08/2011   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  [place]) 05/06/2011   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   Hypersomnia 05/06/2011   Hyperlipidemia 05/06/2011   Skin cancer 05/06/2011   Depression 05/06/2011   OSA (obstructive sleep apnea) 05/06/2011    Past Surgical History:  Procedure Laterality Date   BASAL CELL CARCINOMA EXCISION N/A 03/15/2018   Procedure: RE EXCISION BACK LESION;  Surgeon: Irene Limbo, MD;  Location: [place];  Service: Plastics;  Laterality: N/A;   deep brain stimulation     Parkinson's disease   MASS EXCISION N/A 02/12/2018   Procedure: excision lesion back, layered closure 15 cm;  Surgeon: Irene Limbo,  MD;  Location: [facility];  Service: Plastics;  Laterality: N/A;   SKIN SPLIT GRAFT Left 03/15/2018   Procedure: SKIN GRAFT FROM RIGHT OR LEFT THIGH;  Surgeon: Irene Limbo, MD;  Location: [facility];  Service: Plastics;  Laterality: Left;   SUBTHALAMIC STIMULATOR BATTERY REPLACEMENT N/A 09/01/2012   Procedure: SUBTHALAMIC STIMULATOR BATTERY REPLACEMENT;  Surgeon: Erline Levine, MD;  Location: [facility];  Service: Neurosurgery;  Laterality: N/A;  Deep Brain Stimulator battery change   SUBTHALAMIC STIMULATOR BATTERY REPLACEMENT N/A 07/19/2015   Procedure: Deep Brain stimulator battery replacement;  Surgeon: Erline Levine, MD;  Location: [facility];  Service: Neurosurgery;  Laterality: N/A;  Deep Brain stimulator battery replacement   TONSILLECTOMY     As a child        Home Medications    Prior to Admission medications   Medication Sig Start Date End Date Taking? Authorizing Provider  acetaminophen (TYLENOL) 500 MG tablet Take 1,000 mg by mouth every 6 (six) hours as needed for mild pain.   Yes [provider]  amantadine (SYMMETREL) 100 MG capsule Take 100 mg by mouth 2 (two) times daily.    Yes [provider]  carbidopa-levodopa (SINEMET IR) 25-100 MG tablet Take 1 tablet by mouth 6 (six) times daily. Taking 1 tablet every 2-3 hours daily   Yes [provider]  ezetimibe (ZETIA) 10 MG tablet TAKE 1 TABLET BY MOUTH EVERY DAY Patient taking differently: Take 10 mg by mouth daily.  12/07/18  Yes Biagio Borg, MD  fluticasone [facility]) 50 MCG/ACT nasal spray Place 2 sprays into both nostrils daily as needed for allergies.    Yes [provider]  ibuprofen (ADVIL,MOTRIN) 800 MG tablet Take 800 mg by mouth every 8 (eight) hours as needed for moderate pain.    Yes [provider]  meloxicam (MOBIC) 15 MG tablet TAKE 1 TABLET BY MOUTH EVERY DAY Patient taking differently: Take 15 mg by mouth daily.  12/07/18  Yes  Biagio Borg, MD  midodrine (PROAMATINE) 5 MG tablet Take 1.5 tablets (7.5 mg total) by mouth 3 (three) times daily with meals. Patient taking differently: Take 5 mg by mouth 3 (three) times daily with meals.  01/10/19  Yes Biagio Borg, MD  mirabegron ER (MYRBETRIQ) 50 MG TB24 tablet Take 50 mg by mouth daily.   Yes [provider]  sertraline (ZOLOFT) 50 MG tablet Take 50 mg by mouth daily.   Yes [provider]  HYDROcodone-acetaminophen (NORCO) 5-325 MG tablet Take 1 tablet by mouth every 4 (four) hours as needed for moderate pain. Patient not taking: Reported on 01/13/2019 03/15/18   Irene Limbo, MD    Family History Family History  Problem Relation Age of Onset   Colon cancer Neg Hx     Social History Social History   Tobacco Use   Smoking  status: Never Smoker   Smokeless tobacco: Never Used  Substance Use Topics   Alcohol use: No    Comment: heavy drinker at times in the past. None for 5 years   Drug use: No     Allergies   Statins   Review of Systems Review of Systems  Constitutional: Negative for chills and fever.  HENT: Negative for congestion and sore throat.   Respiratory: Positive for cough and shortness of breath.   Cardiovascular: Positive for chest pain. Negative for palpitations and leg swelling.  Gastrointestinal: Negative for abdominal pain, nausea and vomiting.  Musculoskeletal: Negative for arthralgias and back pain.  Skin: Negative for color change and rash.  Neurological: Negative for seizures, syncope and light-headedness.  All other systems reviewed and are negative.    Physical Exam Updated Vital Signs BP (!) 176/91    Pulse 91    Temp 98 F (36.7 C) (Oral)    Resp (!) 22    Ht 5\' 11"  (1.803 m)    Wt 83.9 kg    SpO2 94%    BMI 25.80 kg/m   Physical Exam Vitals signs and nursing note reviewed.  Constitutional:      Appearance: He is well-developed.  HENT:     Head: Normocephalic and atraumatic.  Eyes:      Conjunctiva/sclera: Conjunctivae normal.  Cardiovascular:     Rate and Rhythm: Normal rate and regular rhythm.     Pulses:          Radial pulses are 2+ on the right side and 2+ on the left side.       Dorsalis pedis pulses are 2+ on the right side and 2+ on the left side.     Heart sounds: Normal heart sounds. No murmur.  Pulmonary:     Effort: Pulmonary effort is normal. No respiratory distress.     Breath sounds: Normal breath sounds.  Abdominal:     Palpations: Abdomen is soft.     Tenderness: There is no abdominal tenderness.  Musculoskeletal:     Comments: DPS battery pack in right anterior chest wall, no overlying warmth or erythema  Skin:    General: Skin is warm and dry.  Neurological:     General: No focal deficit present.     Mental Status: He is alert.     Comments: Tremor of hands and feet      ED Treatments / Results  Labs (all labs ordered are listed, but only abnormal results are displayed) Labs Reviewed  D-DIMER, QUANTITATIVE (NOT AT [facility]) - Abnormal; Notable for the following components:      Result Value   D-Dimer, Quant 2.02 (*)    All other components within normal limits  BASIC METABOLIC PANEL - Abnormal; Notable for the following components:   BUN 30 (*)    Creatinine, Ser 1.53 (*)    GFR calc non Af Amer 46 (*)    GFR calc Af Amer 54 (*)    All other components within normal limits  CBC  TROPONIN I (HIGH SENSITIVITY)  TROPONIN I (HIGH SENSITIVITY)    EKG EKG Interpretation  Date/Time:  Thursday January 13 2019 10:25:29 EDT Ventricular Rate:  81 PR Interval:    QRS Duration: 107 QT Interval:  367 QTC Calculation: 426 R Axis:   -19 Text Interpretation:  Sinus rhythm Left ventricular hypertrophy No STEMI  Confirmed by Octaviano Glow [phone]) on 01/13/2019 10:30:35 AM   Radiology Ct Angio Chest Pe W/cm &/or Wo Cm  Result Date: 01/13/2019 CLINICAL DATA:  Complex chest pain. EXAM: CT ANGIOGRAPHY CHEST WITH CONTRAST TECHNIQUE:  Multidetector CT imaging of the chest was performed using the standard protocol during bolus administration of intravenous contrast. Multiplanar CT image reconstructions and MIPs were obtained to evaluate the vascular anatomy. CONTRAST:  166mL OMNIPAQUE IOHEXOL 350 MG/ML SOLN COMPARISON:  None. FINDINGS: Cardiovascular: Contrast injection is sufficient to demonstrate satisfactory opacification of the pulmonary arteries to the segmental level. There is no pulmonary embolus. The main pulmonary artery is within normal limits for size. There is no CT evidence of acute right heart strain. The visualized aorta is normal. Heart size is normal, without pericardial effusion. Mediastinum/Nodes: --No mediastinal or hilar lymphadenopathy. --No axillary lymphadenopathy. --No supraclavicular lymphadenopathy. --Normal thyroid gland. --The esophagus is unremarkable Lungs/Pleura: There is atelectasis at the lung bases. No pneumothorax. No significant pleural effusion. The trachea is unremarkable. Upper Abdomen: No acute abnormality. Musculoskeletal: There are multiple subacute and chronic left-sided rib fractures. There is a subacute fracture involving the ninth rib posteriorly on the right. There may be an acute displaced fracture of the eleventh rib posteriorly on the right, however this is only partially visualized on this exam. Review of the MIP images confirms the above findings. IMPRESSION: 1. No evidence for pulmonary embolus or aortic dissection. 2. Multiple subacute and chronic left-sided rib fractures. There may be an acute displaced fracture of the posterior right eleventh rib posteriorly, however this is only partially visualized on this exam. There is a subacute fracture involving the posterior ninth rib on the right. 3. Atelectasis at the lung bases. Electronically Signed   By: Constance Holster M.D.   On: 01/13/2019 16:14   Dg Chest Port 1 View  Result Date: 01/13/2019 CLINICAL DATA:  Chest pain EXAM: PORTABLE  CHEST 1 VIEW COMPARISON:  12/24/2018 FINDINGS: The right lung base is obscured by stimulator control box. Within this limitation, no acute abnormality of the lungs in AP portable projection. Mild cardiomegaly. Tortuous thoracic aorta. IMPRESSION: The right lung base is obscured by stimulator control box. Within this limitation, no acute abnormality of the lungs in AP portable projection. Mild cardiomegaly. Electronically Signed   By: Eddie Candle M.D.   On: 01/13/2019 11:07    Procedures Procedures (including critical care time)  Medications Ordered in ED Medications  nitroGLYCERIN (NITROSTAT) SL tablet 0.4 mg (0 mg Sublingual Hold 01/13/19 1135)  carbidopa-levodopa (SINEMET IR) 25-100 MG per tablet immediate release 1 tablet (1 tablet Oral Given by Other 01/13/19 1334)  sodium chloride 0.9 % bolus 1,000 mL (0 mLs Intravenous Stopped 01/13/19 1335)  iohexol (OMNIPAQUE) 350 MG/ML injection 100 mL (100 mLs Intravenous Contrast Given 01/13/19 1527)     Initial Impression / Assessment and Plan / ED Course  I have reviewed the triage vital signs and the nursing notes.  Pertinent labs & imaging results that were available during my care of the patient were reviewed by me and considered in my medical decision making (see chart for details).  68 year old gentleman presenting to the emergency department with chest pressure that began when he woke up from sleep approximately 5 to 6 hours ago.  His symptoms of somewhat improved but is still present.  Describes it is very mild.  On exam he is hypertensive but otherwise appears stable.  Differential is broad and includes pulmonary embolism versus acute coronary syndrome versus lung infection versus other  His new onset of hypertension raises suspicion for aortic dissection.  However he has no tachycardia and has equal  pulses bilaterally.  We will likely be obtaining a CT scan to evaluate for blood clots wants to look at his aorta at that time.  EKG on  arrival shows no ST elevations.  We will check troponin level and chest x-ray. Remainder of labs are pending.  I have a lower suspicion for COVID at this time.  Clinical Course as of Jan 13 1707  Thu Jan 13, 2019  1248 D-Dimer, Quant(!): 2.02 [MT]  1248 Given his medical history of bilateral PEs, I believe that the benefits of a stat CT scan outweigh the risks at this time.  I acknowledge that he has borderline kidney function with a creatinine of 1.53 which appears to be prerenal likely related to poor p.o. intake.  We will try to give him a liter of fluids.   [MT]  1621 IMPRESSION: 1. No evidence for pulmonary embolus or aortic dissection. 2. Multiple subacute and chronic left-sided rib fractures. There may be an acute displaced fracture of the posterior right eleventh rib posteriorly, however this is only partially visualized on this exam. There is a subacute fracture involving the posterior ninth rib on the right. 3. Atelectasis at the lung bases.    [MT]  1626 Discussed patient's CT results.  He reports that he had a mechanical fall approximately a week ago, it is possible that a subacute fracture is related to that.  Advised him that he needs to still follow-up with cardiology given his family history of cardiac disease.  I explained that nothing that we can do in the emergency department can went to percent rule out cardiac disease.  However given that he has been pain-free for the past several hours, and has negative delta troponins and an unremarkable ECG, believe it is reasonable to discharge him home follow-up at this time.   [MT]    Clinical Course User Index [MT] Wyvonnia Dusky, MD     Final Clinical Impressions(s) / ED Diagnoses   Final diagnoses:  Chest wall pain  Other chest pain  Closed fracture of multiple ribs of left side, initial encounter    ED Discharge Orders         Ordered    Ambulatory referral to Cardiology    Comments: Ed visit on 9/24 with left  sided chest pain, had negative ACS workup and negative PE scan, but does have very significant family hx of cardiac disease and no recent cardiac evaluation - recommended outpatient f/u within a week if possible.  Thank you.   01/13/19 1630           Wyvonnia Dusky, MD 01/13/19 [phone]

## 2019-01-13 NOTE — ED Notes (Signed)
Spoke to CT, verified pt on list for scan and not waiting on nursing staff

## 2019-01-19 ENCOUNTER — Other Ambulatory Visit (HOSPITAL_COMMUNITY)
Admission: RE | Admit: 2019-01-19 | Discharge: 2019-01-19 | Disposition: A | Discharge status: Home / Self Care | Payer: Medicare Other | Source: Ambulatory Visit | Attending: Neurosurgery | Admitting: Neurosurgery

## 2019-01-19 ENCOUNTER — Encounter (HOSPITAL_COMMUNITY): Payer: Self-pay | Admitting: *Deleted

## 2019-01-19 DIAGNOSIS — Z20828 Contact with and (suspected) exposure to other viral communicable diseases: Secondary | ICD-10-CM | POA: Diagnosis not present

## 2019-01-19 DIAGNOSIS — Z01812 Encounter for preprocedural laboratory examination: Secondary | ICD-10-CM | POA: Insufficient documentation

## 2019-01-19 LAB — SARS CORONAVIRUS 2 (TAT 6-24 HRS): SARS Coronavirus 2: NEGATIVE

## 2019-01-19 NOTE — Progress Notes (Signed)
Patient denies shortness of breath, fever, cough and chest pain.  PCP - Dr Cathlean Cower Cardiologist - Denies Neurology - Martie Round, PA-C  Chest x-ray - 01/13/19, 1 view EKG - 01/13/19 Stress Test - Denies ECHO - 12/05/16 Cardiac Cath - Denies-  Anesthesia review: No  STOP now taking any Aspirin (unless otherwise instructed by your surgeon), Aleve, Naproxen, Ibuprofen, Motrin, Advil, Goody's, BC's, all herbal medications, fish oil, and all vitamins.   Coronavirus Screening Have you or your wife experienced the following symptoms:  Cough yes/no: No Fever (>100.66F)  yes/no: No Runny nose yes/no: No Sore throat yes/no: No Difficulty breathing/shortness of breath  yes/no: No  Have you or  traveled in the last 14 days and where? yes/no: No   Patient and Wife John Herring verbalized understanding of instructions that were given to them via phone.

## 2019-01-20 ENCOUNTER — Ambulatory Visit (HOSPITAL_COMMUNITY): Payer: Medicare Other | Admitting: Anesthesiology

## 2019-01-20 ENCOUNTER — Ambulatory Visit (HOSPITAL_COMMUNITY)
Admission: RE | Admit: 2019-01-20 | Discharge: 2019-01-20 | Disposition: A | Discharge status: Home / Self Care | Payer: Medicare Other | Attending: Neurosurgery | Admitting: Neurosurgery

## 2019-01-20 ENCOUNTER — Other Ambulatory Visit: Payer: Self-pay

## 2019-01-20 ENCOUNTER — Encounter (HOSPITAL_COMMUNITY)
Admission: RE | Disposition: A | Discharge status: Home / Self Care | Payer: Self-pay | Source: Home / Self Care | Attending: Neurosurgery

## 2019-01-20 ENCOUNTER — Encounter (HOSPITAL_COMMUNITY): Payer: Self-pay | Admitting: *Deleted

## 2019-01-20 DIAGNOSIS — R030 Elevated blood-pressure reading, without diagnosis of hypertension: Secondary | ICD-10-CM | POA: Insufficient documentation

## 2019-01-20 DIAGNOSIS — F329 Major depressive disorder, single episode, unspecified: Secondary | ICD-10-CM | POA: Insufficient documentation

## 2019-01-20 DIAGNOSIS — Z4542 Encounter for adjustment and management of neuropacemaker (brain) (peripheral nerve) (spinal cord): Secondary | ICD-10-CM | POA: Diagnosis not present

## 2019-01-20 DIAGNOSIS — Z79899 Other long term (current) drug therapy: Secondary | ICD-10-CM | POA: Diagnosis not present

## 2019-01-20 DIAGNOSIS — Z791 Long term (current) use of non-steroidal anti-inflammatories (NSAID): Secondary | ICD-10-CM | POA: Diagnosis not present

## 2019-01-20 DIAGNOSIS — Z86711 Personal history of pulmonary embolism: Secondary | ICD-10-CM | POA: Diagnosis not present

## 2019-01-20 DIAGNOSIS — G20 Parkinson's disease: Secondary | ICD-10-CM | POA: Insufficient documentation

## 2019-01-20 HISTORY — PX: SUBTHALAMIC STIMULATOR BATTERY REPLACEMENT: SHX5405

## 2019-01-20 HISTORY — DX: Personal history of urinary calculi: Z87.442

## 2019-01-20 SURGERY — SUBTHALAMIC STIMULATOR BATTERY REPLACEMENT
Anesthesia: General | Site: Chest

## 2019-01-20 MED ORDER — VANCOMYCIN HCL 1000 MG IV SOLR
INTRAVENOUS | Status: AC
  Filled 2019-01-20: qty 1000

## 2019-01-20 MED ORDER — MEPERIDINE HCL 25 MG/ML IJ SOLN: 6.2500 mg | INTRAMUSCULAR | Status: DC | PRN

## 2019-01-20 MED ORDER — LIDOCAINE 2% (20 MG/ML) 5 ML SYRINGE
INTRAMUSCULAR | Status: DC | PRN
  Administered 2019-01-20: 40 mg via INTRAVENOUS

## 2019-01-20 MED ORDER — FENTANYL CITRATE (PF) 250 MCG/5ML IJ SOLN
INTRAMUSCULAR | Status: AC
  Filled 2019-01-20: qty 5

## 2019-01-20 MED ORDER — CHLORHEXIDINE GLUCONATE CLOTH 2 % EX PADS: 6.0000 | MEDICATED_PAD | Freq: Once | CUTANEOUS | Status: DC

## 2019-01-20 MED ORDER — LACTATED RINGERS IV SOLN
INTRAVENOUS | Status: DC
  Administered 2019-01-20: 15:00:00 via INTRAVENOUS

## 2019-01-20 MED ORDER — LIDOCAINE-EPINEPHRINE 1 %-1:100000 IJ SOLN
INTRAMUSCULAR | Status: DC | PRN
  Administered 2019-01-20: 2 mL

## 2019-01-20 MED ORDER — CEFAZOLIN SODIUM-DEXTROSE 2-4 GM/100ML-% IV SOLN
2.0000 g | INTRAVENOUS | Status: AC
  Administered 2019-01-20: 17:00:00 2 g via INTRAVENOUS
  Filled 2019-01-20: qty 100

## 2019-01-20 MED ORDER — MIDAZOLAM HCL 2 MG/2ML IJ SOLN: 0.5000 mg | Freq: Once | INTRAMUSCULAR | Status: DC | PRN

## 2019-01-20 MED ORDER — BUPIVACAINE HCL (PF) 0.5 % IJ SOLN
INTRAMUSCULAR | Status: DC | PRN
  Administered 2019-01-20: 2 mL

## 2019-01-20 MED ORDER — LIDOCAINE-EPINEPHRINE 1 %-1:100000 IJ SOLN
INTRAMUSCULAR | Status: AC
  Filled 2019-01-20: qty 1

## 2019-01-20 MED ORDER — LIDOCAINE 2% (20 MG/ML) 5 ML SYRINGE
INTRAMUSCULAR | Status: AC
  Filled 2019-01-20: qty 5

## 2019-01-20 MED ORDER — FENTANYL CITRATE (PF) 100 MCG/2ML IJ SOLN: 25.0000 ug | INTRAMUSCULAR | Status: DC | PRN

## 2019-01-20 MED ORDER — PROPOFOL 10 MG/ML IV BOLUS
INTRAVENOUS | Status: AC
  Filled 2019-01-20: qty 20

## 2019-01-20 MED ORDER — ONDANSETRON HCL 4 MG/2ML IJ SOLN
INTRAMUSCULAR | Status: AC
  Filled 2019-01-20: qty 2

## 2019-01-20 MED ORDER — MIDAZOLAM HCL 2 MG/2ML IJ SOLN
INTRAMUSCULAR | Status: AC
  Filled 2019-01-20: qty 2

## 2019-01-20 MED ORDER — 0.9 % SODIUM CHLORIDE (POUR BTL) OPTIME
TOPICAL | Status: DC | PRN
  Administered 2019-01-20: 1000 mL

## 2019-01-20 MED ORDER — PROPOFOL 500 MG/50ML IV EMUL
INTRAVENOUS | Status: DC | PRN
  Administered 2019-01-20: 50 ug/kg/min via INTRAVENOUS

## 2019-01-20 MED ORDER — BUPIVACAINE HCL (PF) 0.5 % IJ SOLN
INTRAMUSCULAR | Status: AC
  Filled 2019-01-20: qty 30

## 2019-01-20 MED ORDER — VANCOMYCIN HCL 1000 MG IV SOLR
INTRAVENOUS | Status: DC | PRN
  Administered 2019-01-20: 1000 mg

## 2019-01-20 MED ORDER — PROPOFOL 10 MG/ML IV BOLUS
INTRAVENOUS | Status: DC | PRN
  Administered 2019-01-20: 20 mg via INTRAVENOUS

## 2019-01-20 MED ORDER — FENTANYL CITRATE (PF) 100 MCG/2ML IJ SOLN
INTRAMUSCULAR | Status: DC | PRN
  Administered 2019-01-20 (×2): 50 ug via INTRAVENOUS

## 2019-01-20 MED ORDER — DEXAMETHASONE SODIUM PHOSPHATE 10 MG/ML IJ SOLN
INTRAMUSCULAR | Status: AC
  Filled 2019-01-20: qty 1

## 2019-01-20 MED ORDER — PROPOFOL 1000 MG/100ML IV EMUL
INTRAVENOUS | Status: AC
  Filled 2019-01-20: qty 100

## 2019-01-20 MED ORDER — PROMETHAZINE HCL 25 MG/ML IJ SOLN: 6.2500 mg | INTRAMUSCULAR | Status: DC | PRN

## 2019-01-20 MED ORDER — PHENYLEPHRINE 40 MCG/ML (10ML) SYRINGE FOR IV PUSH (FOR BLOOD PRESSURE SUPPORT)
PREFILLED_SYRINGE | INTRAVENOUS | Status: DC | PRN
  Administered 2019-01-20 (×5): 80 ug via INTRAVENOUS

## 2019-01-20 MED ORDER — BACITRACIN ZINC 500 UNIT/GM EX OINT
TOPICAL_OINTMENT | CUTANEOUS | Status: AC
  Filled 2019-01-20: qty 28.35

## 2019-01-20 MED ORDER — MIDAZOLAM HCL 5 MG/5ML IJ SOLN
INTRAMUSCULAR | Status: DC | PRN
  Administered 2019-01-20 (×2): 1 mg via INTRAVENOUS

## 2019-01-20 SURGICAL SUPPLY — 40 items
ADH SKN CLS APL DERMABOND .7 (GAUZE/BANDAGES/DRESSINGS) ×1
CANISTER SUCT 3000ML PPV (MISCELLANEOUS) ×3 IMPLANT
CARTRIDGE OIL MAESTRO DRILL (MISCELLANEOUS) ×1 IMPLANT
COVER WAND RF STERILE (DRAPES) ×3 IMPLANT
DECANTER SPIKE VIAL GLASS SM (MISCELLANEOUS) ×3 IMPLANT
DERMABOND ADVANCED (GAUZE/BANDAGES/DRESSINGS) ×2
DERMABOND ADVANCED .7 DNX12 (GAUZE/BANDAGES/DRESSINGS) ×1 IMPLANT
DIFFUSER DRILL AIR PNEUMATIC (MISCELLANEOUS) ×3 IMPLANT
DRAPE CAMERA VIDEO/LASER (DRAPES) ×2 IMPLANT
DRAPE LAPAROTOMY 100X72 PEDS (DRAPES) ×3 IMPLANT
DRAPE POUCH INSTRU U-SHP 10X18 (DRAPES) ×3 IMPLANT
DRSG OPSITE POSTOP 3X4 (GAUZE/BANDAGES/DRESSINGS) ×2 IMPLANT
DURAPREP 26ML APPLICATOR (WOUND CARE) ×3 IMPLANT
GAUZE 4X4 16PLY RFD (DISPOSABLE) IMPLANT
GLOVE BIO SURGEON STRL SZ8 (GLOVE) ×3 IMPLANT
GLOVE BIOGEL PI IND STRL 8 (GLOVE) ×1 IMPLANT
GLOVE BIOGEL PI IND STRL 8.5 (GLOVE) ×1 IMPLANT
GLOVE BIOGEL PI INDICATOR 8 (GLOVE) ×2
GLOVE BIOGEL PI INDICATOR 8.5 (GLOVE) ×2
GLOVE ECLIPSE 8.0 STRL XLNG CF (GLOVE) ×3 IMPLANT
GLOVE EXAM NITRILE XL STR (GLOVE) IMPLANT
GOWN STRL REUS W/ TWL LRG LVL3 (GOWN DISPOSABLE) IMPLANT
GOWN STRL REUS W/ TWL XL LVL3 (GOWN DISPOSABLE) ×1 IMPLANT
GOWN STRL REUS W/TWL 2XL LVL3 (GOWN DISPOSABLE) ×3 IMPLANT
GOWN STRL REUS W/TWL LRG LVL3 (GOWN DISPOSABLE)
GOWN STRL REUS W/TWL XL LVL3 (GOWN DISPOSABLE) ×3
KIT BASIN OR (CUSTOM PROCEDURE TRAY) ×3 IMPLANT
KIT TURNOVER KIT B (KITS) ×3 IMPLANT
NDL HYPO 25X1 1.5 SAFETY (NEEDLE) ×1 IMPLANT
NEEDLE HYPO 25X1 1.5 SAFETY (NEEDLE) ×3 IMPLANT
NEUROSTIM OCTOPOLAR ~~LOC~~ 49X65 (Neuro Prosthesis/Implant) ×2 IMPLANT
NS IRRIG 1000ML POUR BTL (IV SOLUTION) ×3 IMPLANT
OIL CARTRIDGE MAESTRO DRILL (MISCELLANEOUS) ×3
PACK LAMINECTOMY NEURO (CUSTOM PROCEDURE TRAY) ×3 IMPLANT
PAD ARMBOARD 7.5X6 YLW CONV (MISCELLANEOUS) ×9 IMPLANT
SUT VIC AB 2-0 CP2 18 (SUTURE) ×3 IMPLANT
SUT VIC AB 3-0 SH 8-18 (SUTURE) ×3 IMPLANT
TOWEL GREEN STERILE (TOWEL DISPOSABLE) ×3 IMPLANT
TOWEL GREEN STERILE FF (TOWEL DISPOSABLE) ×3 IMPLANT
WATER STERILE IRR 1000ML POUR (IV SOLUTION) ×3 IMPLANT

## 2019-01-20 NOTE — H&P (Signed)
Patient ID:   [id number] Patient: John Herring  Date of Birth: [birth year] Visit Type: Office Visit   Date: 01/17/2019 03:30 PM Provider: Marchia Meiers. Vertell Limber MD   This 68 year old male presents for DBS Battery Change.  HISTORY OF PRESENT ILLNESS:  1.  DBS Battery Change  The patient comes in today with a depleted deep brain stimulator implantable pulse generator.  He says that his tremor has returned and so has his mental fog..  Dr. Linus Mako is managing his Parkinson's at [facility].  The patient last underwent the IPG change in March of 2017 and before that he underwent a change in May of 2014. His deep brain stimulator was placed in 2009.  The patient has a right chest battery which appears to in good shape and extensions appeared to be well-positioned without any tension.  The patient had an episode of pulmonary embolism and arrest after he had blood clots in August of 2018 following air travel to [place].  He recovered fully from this and was on Eliquis but has been off that medication.  The patient has no other new medical problems.          PAST MEDICAL/SURGICAL HISTORY:   (Reviewed, updated)    Disease/disorder Onset Date Management Date Comments    Tonsillectomy      History of adenoidectomy       PAST MEDICAL HISTORY, SURGICAL HISTORY, FAMILY HISTORY, SOCIAL HISTORY AND REVIEW OF SYSTEMS I have reviewed the patient's past medical, surgical, family and social history as well as the comprehensive review of systems as included on the [facility] history form dated 01/13/2019, which I have signed.  Family History:  (Reviewed, updated) Relationship Family Member Name Deceased Age at Death Condition Onset Age Cause of Death      Family history of Parkinson's disease  N      Family history of Cardiovascular disease  N    Social History: Reviewed, no changes.   MEDICATIONS: (added, continued or stopped this visit) Started Medication  Directions Instruction Stopped   amantadine HCl 100 mg tablet take 1 tablet by oral route 2 times every day     Azilect 1 mg tablet take 1 tablet by oral route  every day     carbidopa 25 mg-levodopa 100 mg tablet take 1 tablet by oral route 4 times every day     ibuprofen 800 mg tablet take 1 tablet by oral route  every 8 hours as needed     meloxicam 15 mg tablet take 1 tablet by oral route  every day     midodrine 10 mg tablet take 1 tablet by oral route 3 times every day     oxymetazoline 0.05 % nasal spray spray 2 spray by intranasal route 2 times every day in each nostril in the morning and evening     sertraline 50 mg tablet take 1 tablet by oral route  every day       ALLERGIES: Ingredient Reaction Medication Name Comment  STATINS-HMG-COA REDUCTASE INHIBITORS         PHYSICAL EXAM:   Vitals Date Temp F BP Pulse Ht In Wt Lb BMI BSA Pain Score  01/17/2019 96.4 146/101 80 71 172 23.99  0/10    PHYSICAL EXAM Details General Level of Distress: no acute distress Overall Appearance: normal  Head and Face  Right Left  Fundoscopic Exam:  normal normal    Cardiovascular Cardiac: regular rate and rhythm without murmur  Right  Left  Carotid Pulses: normal normal  Respiratory Lungs: clear to auscultation  Neurological Orientation: normal Recent and Remote Memory: normal Attention Span and Concentration:   normal Language: normal Fund of Knowledge: normal  Right Left Sensation: normal normal Upper Extremity Coordination: normal normal  Lower Extremity Coordination: normal normal  Musculoskeletal Gait and Station: normal  Right Left Upper Extremity Muscle Strength: normal normal Lower Extremity Muscle Strength: normal normal Upper Extremity Muscle Tone:  normal normal Lower Extremity Muscle Tone: normal normal   Motor Strength Upper and lower extremity motor strength was tested in the clinically pertinent muscles.     Deep Tendon  Reflexes  Right Left Biceps: normal normal Triceps: normal normal Brachioradialis: normal normal Patellar: normal normal Achilles: normal normal  Cranial Nerves II. Optic Nerve/Visual Fields: normal III. Oculomotor: normal IV. Trochlear: normal V. Trigeminal: normal VI. Abducens: normal VII. Facial: normal VIII. Acoustic/Vestibular: normal IX. Glossopharyngeal: normal X. Vagus: normal XI. Spinal Accessory: normal XII. Hypoglossal: normal  Motor and other Tests Lhermittes: negative Rhomberg: negative Pronator drift: absent     Right Left Hoffman's: normal normal Clonus: normal normal Babinski: normal normal   Additional Findings:  Patient has rigidity and bradykinesia with rest tremor.  Walks slowly a shuffling gait    IMPRESSION:   Depleted implantable pulse generator with need for urgent revision as the patient is not doing well without it  PLAN:  Proceed with right chest IPG revision this week  Orders: Instruction(s)/Education: Assessment Instruction  R03.0 Hypertension education   Completed Orders (this encounter) Order Details Reason Side Interpretation Result Initial Treatment Date Region  Hypertension education Continue to monitor blood pressure and if it remains elevated contact primary physician         Assessment/Plan   # Detail Type Description   1. Assessment Paralysis agitans (G20).       2. Assessment End of battery life of deep brain stimulator (Z46.2).       3. Assessment Elevated blood-pressure reading, w/o diagnosis of htn (R03.0).         Pain Management Plan Pain Scale: 0/10. Method: Numeric Pain Intensity Scale.              Provider:  Marchia Meiers. Vertell Limber MD  01/18/2019 02:23 PM    Dictation edited by: Marchia Meiers. Vertell Limber    CC Providers: [facility] [address] [place], [place] [address]-               Electronically signed by Marchia Meiers. Vertell Limber MD on 01/18/2019 02:23  PM

## 2019-01-20 NOTE — Interval H&P Note (Signed)
History and Physical Interval Note:  01/20/2019 4:21 PM  John Herring  has presented today for surgery, with the diagnosis of End of battery life of deep brain stimulator.  The various methods of treatment have been discussed with the patient and family. After consideration of risks, benefits and other options for treatment, the patient has consented to  Procedure(s) with comments: Deep brain stimulator battery change (N/A) - Deep brain stimulator battery change as a surgical intervention.  The patient's history has been reviewed, patient examined, no change in status, stable for surgery.  I have reviewed the patient's chart and labs.  Questions were answered to the patient's satisfaction.     Peggyann Shoals

## 2019-01-20 NOTE — Op Note (Signed)
01/20/2019  5:42 PM  PATIENT:  John Herring  68 y.o. male  PRE-OPERATIVE DIAGNOSIS:  End of battery life of deep brain stimulator for Parkinson's Disease  POST-OPERATIVE DIAGNOSIS:  End of battery life of deep brain stimulator for Parkinson's Disease   PROCEDURE:  Procedure(s) with comments: Deep brain stimulator battery change (N/A) - Deep brain stimulator battery change  SURGEON:  Surgeon(s) and Role:    Erline Levine, MD - Primary  PHYSICIAN ASSISTANT:   ASSISTANTS: Poteat, RN   ANESTHESIA:   local and MAC  EBL:  10 mL   BLOOD ADMINISTERED:none  DRAINS: none   LOCAL MEDICATIONS USED:  MARCAINE    and LIDOCAINE   SPECIMEN:  No Specimen  DISPOSITION OF SPECIMEN:  N/A  COUNTS:  YES  TOURNIQUET:  * No tourniquets in log *  DICTATION: Patient has implanted subthalamic stimulator electrodes and IPG, which is now depleted.  It was elected for patient to undergo IPG revision.  PROCEDURE: Patient was brought to the operating room and given intravenous sedation.  Right upper chest was prepped with betadine scrub and Duraprep.  Area of planned incision was infiltrated with lidocaine.  Prior incision was reopened and the old IPG was externalized.  Adaptor was connected to new IPG which was placed in the pocket.  Wound was irrigated with bacitracin and  vancomycin. Then irrigated once more.  Incision was closed with 2-0 Vicryl and 3-0 vicryl sutures and dressed with a sterile occlusive dressing.  Counts were correct at the end of the case.  PLAN OF CARE: Discharge to home after PACU  PATIENT DISPOSITION:  PACU - hemodynamically stable.   Delay start of Pharmacological VTE agent (>24hrs) due to surgical blood loss or risk of bleeding: yes

## 2019-01-20 NOTE — Interval H&P Note (Signed)
History and Physical Interval Note:  01/20/2019 2:47 PM  John Herring  has presented today for surgery, with the diagnosis of End of battery life of deep brain stimulator.  The various methods of treatment have been discussed with the patient and family. After consideration of risks, benefits and other options for treatment, the patient has consented to  Procedure(s) with comments: Deep brain stimulator battery change (N/A) - Deep brain stimulator battery change as a surgical intervention.  The patient's history has been reviewed, patient examined, no change in status, stable for surgery.  I have reviewed the patient's chart and labs.  Questions were answered to the patient's satisfaction.     Peggyann Shoals

## 2019-01-20 NOTE — Anesthesia Postprocedure Evaluation (Signed)
Anesthesia Post Note  Patient: John Herring  Procedure(s) Performed: Deep brain stimulator battery change (N/A Chest)     Patient location during evaluation: PACU Anesthesia Type: MAC Level of consciousness: awake and alert, patient cooperative and oriented Pain management: pain level controlled Vital Signs Assessment: post-procedure vital signs reviewed and stable Respiratory status: spontaneous breathing, nonlabored ventilation and respiratory function stable Cardiovascular status: blood pressure returned to baseline and stable Postop Assessment: no apparent nausea or vomiting Anesthetic complications: no    Last Vitals:  Vitals:   01/20/19 1817 01/20/19 1832  BP: 140/76 131/80  Pulse: 78 83  Resp: 15 16  Temp:    SpO2: 96% 96%    Last Pain:  Vitals:   01/20/19 1817  TempSrc:   PainSc: (P) 0-No pain                 Breonna Gafford,E. Marjon Doxtater

## 2019-01-20 NOTE — Anesthesia Procedure Notes (Signed)
Procedure Name: MAC Date/Time: 01/20/2019 4:51 PM Performed by: Renato Shin, CRNA Pre-anesthesia Checklist: Patient identified, Emergency Drugs available, Suction available and Patient being monitored Patient Re-evaluated:Patient Re-evaluated prior to induction Oxygen Delivery Method: Nasal cannula Preoxygenation: Pre-oxygenation with 100% oxygen Induction Type: IV induction Placement Confirmation: positive ETCO2 and breath sounds checked- equal and bilateral Dental Injury: Teeth and Oropharynx as per pre-operative assessment

## 2019-01-20 NOTE — Transfer of Care (Signed)
Immediate Anesthesia Transfer of Care Note  Patient: John Herring  Procedure(s) Performed: Deep brain stimulator battery change (N/A Chest)  Patient Location: PACU  Anesthesia Type:MAC  Level of Consciousness: awake, alert  and patient cooperative  Airway & Oxygen Therapy: Patient Spontanous Breathing and Patient connected to nasal cannula oxygen  Post-op Assessment: Report given to RN and Post -op Vital signs reviewed and stable  Post vital signs: Reviewed and stable  Last Vitals:  Vitals Value Taken Time  BP 156/91 01/20/19 1747  Temp    Pulse 83 01/20/19 1750  Resp 14 01/20/19 1750  SpO2 100 % 01/20/19 1750  Vitals shown include unvalidated device data.  Last Pain:  Vitals:   01/20/19 1453  TempSrc: Oral         Complications: No apparent anesthesia complications

## 2019-01-20 NOTE — Anesthesia Preprocedure Evaluation (Addendum)
Anesthesia Evaluation  Patient identified by MRN, date of birth, ID band Patient awake    Reviewed: Allergy & Precautions, NPO status , Patient's Chart, lab work & pertinent test results  History of Anesthesia Complications Negative for: history of anesthetic complications  Airway Mallampati: I  TM Distance: >3 FB Neck ROM: Full    Dental  (+) Dental Advisory Given, Caps   Pulmonary  01/19/2019 SARS coronavirus NEG   breath sounds clear to auscultation       Cardiovascular (-) anginanegative cardio ROS   Rhythm:Regular Rate:Normal     Neuro/Psych Depression parkinson's    GI/Hepatic negative GI ROS, Neg liver ROS,   Endo/Other  negative endocrine ROS  Renal/GU Renal InsufficiencyRenal disease (creat 1.53)     Musculoskeletal  (+) Arthritis ,   Abdominal   Peds  Hematology negative hematology ROS (+)   Anesthesia Other Findings   Reproductive/Obstetrics                            Anesthesia Physical Anesthesia Plan  ASA: III  Anesthesia Plan: MAC   Post-op Pain Management:    Induction: Intravenous  PONV Risk Score and Plan: 2 and Ondansetron and Dexamethasone  Airway Management Planned: Natural Airway and Simple Face Mask  Additional Equipment:   Intra-op Plan:   Post-operative Plan:   Informed Consent: I have reviewed the patients History and Physical, chart, labs and discussed the procedure including the risks, benefits and alternatives for the proposed anesthesia with the patient or authorized representative who has indicated his/her understanding and acceptance.     Dental advisory given  Plan Discussed with: CRNA and Surgeon  Anesthesia Plan Comments: (Dr Vertell Limber changes pref to MAC, pt agreeable)       Anesthesia Quick Evaluation

## 2019-01-20 NOTE — Brief Op Note (Signed)
01/20/2019  5:42 PM  PATIENT:  John Herring  68 y.o. male  PRE-OPERATIVE DIAGNOSIS:  End of battery life of deep brain stimulator for Parkinson's Disease  POST-OPERATIVE DIAGNOSIS:  End of battery life of deep brain stimulator for Parkinson's Disease   PROCEDURE:  Procedure(s) with comments: Deep brain stimulator battery change (N/A) - Deep brain stimulator battery change  SURGEON:  Surgeon(s) and Role:    * Tanner Vigna, MD - Primary  PHYSICIAN ASSISTANT:   ASSISTANTS: Poteat, RN   ANESTHESIA:   local and MAC  EBL:  10 mL   BLOOD ADMINISTERED:none  DRAINS: none   LOCAL MEDICATIONS USED:  MARCAINE    and LIDOCAINE   SPECIMEN:  No Specimen  DISPOSITION OF SPECIMEN:  N/A  COUNTS:  YES  TOURNIQUET:  * No tourniquets in log *  DICTATION: Patient has implanted subthalamic stimulator electrodes and IPG, which is now depleted.  It was elected for patient to undergo IPG revision.  PROCEDURE: Patient was brought to the operating room and given intravenous sedation.  Right upper chest was prepped with betadine scrub and Duraprep.  Area of planned incision was infiltrated with lidocaine.  Prior incision was reopened and the old IPG was externalized.  Adaptor was connected to new IPG which was placed in the pocket.  Wound was irrigated with bacitracin and  vancomycin. Then irrigated once more.  Incision was closed with 2-0 Vicryl and 3-0 vicryl sutures and dressed with a sterile occlusive dressing.  Counts were correct at the end of the case.  PLAN OF CARE: Discharge to home after PACU  PATIENT DISPOSITION:  PACU - hemodynamically stable.   Delay start of Pharmacological VTE agent (>24hrs) due to surgical blood loss or risk of bleeding: yes  

## 2019-01-21 ENCOUNTER — Encounter (HOSPITAL_COMMUNITY): Payer: Self-pay | Admitting: Neurosurgery

## 2019-02-12 ENCOUNTER — Other Ambulatory Visit: Payer: Self-pay | Admitting: Internal Medicine

## 2019-02-21 ENCOUNTER — Other Ambulatory Visit: Payer: Self-pay

## 2019-02-21 ENCOUNTER — Ambulatory Visit: Payer: Medicare Other | Admitting: Cardiology

## 2019-02-21 ENCOUNTER — Encounter: Payer: Self-pay | Admitting: Cardiology

## 2019-02-21 VITALS — BP 100/70 | HR 90 | Ht 71.0 in | Wt 177.6 lb

## 2019-02-21 DIAGNOSIS — I9589 Other hypotension: Secondary | ICD-10-CM | POA: Diagnosis not present

## 2019-02-21 DIAGNOSIS — I2510 Atherosclerotic heart disease of native coronary artery without angina pectoris: Secondary | ICD-10-CM | POA: Diagnosis not present

## 2019-02-21 DIAGNOSIS — I209 Angina pectoris, unspecified: Secondary | ICD-10-CM | POA: Diagnosis not present

## 2019-02-21 DIAGNOSIS — Z01812 Encounter for preprocedural laboratory examination: Secondary | ICD-10-CM

## 2019-02-21 DIAGNOSIS — I2584 Coronary atherosclerosis due to calcified coronary lesion: Secondary | ICD-10-CM

## 2019-02-21 DIAGNOSIS — G20 Parkinson's disease: Secondary | ICD-10-CM

## 2019-02-21 MED ORDER — ASPIRIN EC 81 MG PO TBEC: 81.0000 mg | DELAYED_RELEASE_TABLET | Freq: Every day | ORAL | 3 refills | Status: AC

## 2019-02-21 MED ORDER — ROSUVASTATIN CALCIUM 5 MG PO TABS: 5.0000 mg | ORAL_TABLET | Freq: Every day | ORAL | 11 refills | Status: DC

## 2019-02-21 NOTE — Progress Notes (Signed)
Cardiology Office Note:    Date:  02/21/2019   ID:  BRANTLEE RICHENDOLLAR, DOB 1950/05/26, MRN [id number]  PCP:  Biagio Borg, MD  Cardiologist:  Candee Furbish, MD  Electrophysiologist:  None   Referring MD: Wyvonnia Dusky, MD     History of Present Illness:    John Herring is a 68 y.o. male here for the evaluation of chest pain at the request of Dr. Langston Masker.  Has Parkinson's disease post deep brain stimulator with hypotension on midodrine, prior bilateral PEs approximately 2018 after long flight.  Previously woke up in late September with a chest pressure sensation worse at breakfast.  Blood pressure was 180/110 which was significantly elevated for him.  They gave him a full dose aspirin.  Pain was approximately a 3/10 in the emergency room.  Never had a pain like this before. Tightness. Tremors were worse with upper torso at the time since his battery had depleted on his deep brain stimulator.  Since then it has been replaced.  Does have some chronic low back pain, also noting SOB with vacuum of the house for instance, has to stop.  Pressure-like left side diffuse no radiation.  No jaw pain no left arm pain.  No nausea or vomiting.  No personal history of heart disease.  He does have a history of heart disease however on his mother side of the family.  He does have an early family history of MI in nearly all of his siblings and parents.  No smoking.  He was treated for the bilateral PE that was provoked by airplane flight for 6 months.  [place]. This was stopped.  CT scan of the chest was performed on 01/13/2019 that showed no evidence of PE or aortic dissection.  He did have multiple subacute and chronic left-sided rib fractures.  I personally reviewed his CT scan and he does have a dense focal calcification at the LAD at the origin of the LAD after it bifurcates from the long left main artery.    Past Medical History:  Diagnosis Date  . Arthritis   . Depression 05/06/2011  . Enlarged  prostate   . GERD (gastroesophageal reflux disease) 12/02/2012   patient denies this dx   . History of kidney stones    passed stone  . Hyperlipidemia 05/06/2011  . Hypersomnia 05/06/2011  . Hypotension   . 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 Memory loss 07/12/2012  . 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 Skin cancer 05/06/2011  . Syncope    orthostatic syncope, no problems in tha last 3-4 months    Past Surgical History:  Procedure Laterality Date  . BASAL CELL CARCINOMA EXCISION N/A 03/15/2018   Procedure: RE EXCISION BACK LESION;  Surgeon: Irene Limbo, MD;  Location: [facility];  Service: Plastics;  Laterality: N/A;  . COLONOSCOPY  2013  . deep brain stimulation     Parkinson's disease  . MASS EXCISION N/A 02/12/2018   Procedure: excision lesion back, layered closure 15 cm;  Surgeon: Irene Limbo, MD;  Location: [facility];  Service: Plastics;  Laterality: N/A;  . SKIN SPLIT GRAFT Left 03/15/2018   Procedure: SKIN GRAFT FROM RIGHT OR LEFT THIGH;  Surgeon: Irene Limbo, MD;  Location: [facility];  Service: Plastics;  Laterality: Left;  . SUBTHALAMIC STIMULATOR BATTERY REPLACEMENT N/A 09/01/2012   Procedure: SUBTHALAMIC STIMULATOR BATTERY REPLACEMENT;  Surgeon: Erline Levine, MD;  Location: [facility];  Service: Neurosurgery;  Laterality: N/A;  Deep Brain Stimulator battery change  . SUBTHALAMIC STIMULATOR BATTERY REPLACEMENT N/A 07/19/2015   Procedure: Deep Brain stimulator battery replacement;  Surgeon: Erline Levine, MD;  Location: [facility];  Service: Neurosurgery;  Laterality: N/A;  Deep Brain stimulator battery replacement  . SUBTHALAMIC STIMULATOR BATTERY REPLACEMENT N/A 01/20/2019   Procedure: Deep brain stimulator battery change;  Surgeon: Erline Levine, MD;  Location: [facility];  Service: Neurosurgery;  Laterality: N/A;  Deep brain stimulator battery change  . TONSILLECTOMY     As a child    Current  Medications: Current Meds  Medication Sig  . acetaminophen (TYLENOL) 500 MG tablet Take 1,000 mg by mouth every 6 (six) hours as needed for mild pain.  Marland Kitchen amantadine (SYMMETREL) 100 MG capsule Take 100 mg by mouth 2 (two) times daily.   Marland Kitchen buPROPion (WELLBUTRIN XL) 150 MG 24 hr tablet Take 150 mg by mouth daily.  . carbidopa-levodopa (SINEMET IR) 25-100 MG tablet Take 1 tablet by mouth 6 (six) times daily. Taking 1 tablet every 2-3 hours daily  . ezetimibe (ZETIA) 10 MG tablet TAKE 1 TABLET BY MOUTH EVERY DAY  . HYDROcodone-acetaminophen (NORCO) 5-325 MG tablet Take 1 tablet by mouth every 4 (four) hours as needed for moderate pain.  Marland Kitchen ibuprofen (ADVIL,MOTRIN) 800 MG tablet Take 800 mg by mouth every 8 (eight) hours as needed for moderate pain.   . meloxicam (MOBIC) 15 MG tablet TAKE 1 TABLET BY MOUTH EVERY DAY  . midodrine (PROAMATINE) 5 MG tablet Take 5 mg by mouth 3 (three) times daily with meals.  . mirabegron ER (MYRBETRIQ) 50 MG TB24 tablet Take 50 mg by mouth daily.     Allergies:   Statins   Social History   Socioeconomic History  . Marital status: Married    Spouse name: Not on file  . Number of children: 2  . Years of education: 57  . Highest education level: Not on file  Occupational History  . Occupation: Disabled  Social Needs  . Financial resource strain: Not on file  . Food insecurity    Worry: Not on file    Inability: Not on file  . Transportation needs    Medical: Not on file    Non-medical: Not on file  Tobacco Use  . Smoking status: Never Smoker  . Smokeless tobacco: Never Used  Substance and Sexual Activity  . Alcohol use: No    Comment: heavy drinker at times in the past. None for 5 years  . Drug use: No  . Sexual activity: Not on file  Lifestyle  . Physical activity    Days per week: Not on file    Minutes per session: Not on file  . Stress: Not on file  Relationships  . Social Herbalist on phone: Not on file    Gets together: Not on  file    Attends religious service: Not on file    Active member of club or organization: Not on file    Attends meetings of clubs or organizations: Not on file    Relationship status: Not on file  Other Topics Concern  . Not on file  Social History Narrative   Lives with wife     Family History: The patient's family history is negative for Colon cancer.  ROS:   Please see the history of present illness.    Minimal tremor noted, no fever chills nausea vomiting all other systems reviewed and are negative.  EKGs/Labs/Other Studies Reviewed:  The following studies were reviewed today:  12/05/2016-DVT positive, treated for 6 months, discovered in [place].  CT scan of chest personally reviewed as above, focal ostial LAD calcification  EKG: 01/13/2019-sinus rhythm 81 bpm LVH personally reviewed  Recent Labs: 06/23/2018: ALT 4; TSH 2.21 01/13/2019: BUN 30; Creatinine, Ser 1.53; Hemoglobin 14.0; Platelets 208; Potassium 4.7; Sodium 139  Recent Lipid Panel    Component Value Date/Time   CHOL 220 (H) 06/23/2018 1603   TRIG 336.0 (H) 06/23/2018 1603   HDL 38.40 (L) 06/23/2018 1603   CHOLHDL 6 06/23/2018 1603   VLDL 67.2 (H) 06/23/2018 1603   LDLCALC 154 (H) 12/08/2014 1054   LDLDIRECT 154.0 06/23/2018 1603    Physical Exam:    VS:  BP 100/70   Pulse 90   Ht 5\' 11"  (1.803 m)   Wt 177 lb 9.6 oz (80.6 kg)   SpO2 99%   BMI 24.77 kg/m     Wt Readings from Last 3 Encounters:  02/21/19 177 lb 9.6 oz (80.6 kg)  01/20/19 184 lb 15.5 oz (83.9 kg)  01/13/19 185 lb (83.9 kg)     GEN:  Well nourished, well developed in no acute distress HEENT: Normal NECK: No JVD; No carotid bruits LYMPHATICS: No lymphadenopathy CARDIAC: RRR, no murmurs, rubs, gallops RESPIRATORY:  Clear to auscultation without rales, wheezing or rhonchi  ABDOMEN: Soft, non-tender, non-distended MUSCULOSKELETAL:  No edema; No deformity  SKIN: Warm and dry NEUROLOGIC:  Alert and oriented x 3, parkinsonian  symptoms noted PSYCHIATRIC:  Normal affect   ASSESSMENT:    1. Angina pectoris ([place])   2. Coronary artery calcification   3. Parkinson's disease ([place])   4. Other specified hypotension   5. Pre-procedure lab exam    PLAN:    In order of problems listed above:  Chest pain with focal ostial LAD calcification -Pressure-like sensation previously.  PE negative.  No dissection.  Given the focal LAD calcification, I think it would make sense for [place] to proceed with diagnostic angiography to make sure there is not a significant lesion in that region.  Risks and benefits including stroke heart attack death renal impairment bleeding have been discussed.  Both he and his wife are willing to proceed.  Lengthy discussion. -We will start 81 mg of aspirin.  Hyperlipidemia -Prior LDL in 2020 154.  Currently on Zetia. -I would like to go ahead and start him on Crestor 5 mg once a day, gentle dose.  Titrate up as possible.  He had previously been on atorvastatin 20 mg but this was stopped in the past when he was struggling with his hypotension.  Certainly would be unusual for a statin to cause hypotension.  Parkinson's on the other hand commonly can cause this abnormality.  Parkinson's disease -Has associated orthostatic blood pressure issues, some chronic hypotension, takes midodrine.  Overall fairly stable.  He has had a few falls which has caused some left rib scarring.  After long discussion with him and his wife, he is able to comply with proceeding with angiogram.   Medication Adjustments/Labs and Tests Ordered: Current medicines are reviewed at length with the patient today.  Concerns regarding medicines are outlined above.  Orders Placed This Encounter  Procedures  . CBC  . Basic metabolic panel   Meds ordered this encounter  Medications  . rosuvastatin (CRESTOR) 5 MG tablet    Sig: Take 1 tablet (5 mg total) by mouth daily.    Dispense:  30 tablet    Refill:  11  . aspirin EC 81 MG  tablet    Sig: Take 1 tablet (81 mg total) by mouth daily.    Dispense:  90 tablet    Refill:  3    Patient Instructions  Medication Instructions:  Please start Aspirin 81 mg a day.  Start Crestor 5 mg a day.  Continue all other medications as listed.  *If you need a refill on your cardiac medications before your next appointment, please call your pharmacy*  Lab Work: Please have blood work today (BMP, CBC) If you have labs (blood work) drawn today and your tests are completely normal, you will receive your results only by: Marland Kitchen MyChart Message (if you have MyChart) OR . A paper copy in the mail If you have any lab test that is abnormal or we need to change your treatment, we will call you to review the results.  You will need COVID screening before your heart cath. This will be completed at [address] - stay in the right hand left for procedures (under the brick awning)  Testing/Procedures: Your physician has requested that you have a cardiac catheterization. Cardiac catheterization is used to diagnose and/or treat various heart conditions. Doctors may recommend this procedure for a number of different reasons. The most common reason is to evaluate chest pain. Chest pain can be a symptom of coronary artery disease (CAD), and cardiac catheterization can show whether plaque is narrowing or blocking your heart's arteries. This procedure is also used to evaluate the valves, as well as measure the blood flow and oxygen levels in different parts of your heart. For further information please visit HugeFiesta.tn. Please follow instruction sheet, as given.  Follow-Up: Follow up will be determined after the above testing.  Thank you for choosing [facility]!!      [facility] OFFICE [place], [place] [address] Dept: [phone] Loc: [phone]  SAVIOUR MATHIAS   02/21/2019  You are scheduled for a cardiac cath on Monday, November 9 with Dr. Saunders Revel.  1. Please arrive at the [facility] (Main Entrance A) at [facility]: [place], [place] [address] at 5:30 am (two hours before your procedure to ensure your preparation). Free valet parking service is available.   Special note: Every effort is made to have your procedure done on time. Please understand that emergencies sometimes delay scheduled procedures.  2. Diet: Nothing after midnight  3. Labs: please gave today (CBC/BMP)  COVID screening on Friday 02/25/2019 at [place].  4. Medication instructions in preparation for your procedure:  On the morning of your procedure, take your Asprin and any morning medicines NOT listed above.  You may use sips of water.  5. Plan for one night stay--bring personal belongings. 6. Bring a current list of your medications and current insurance cards. 7. You MUST have a responsible person to drive you home. 8. Someone MUST be with you the first 24 hours after you arrive home or your discharge will be delayed. 9. Please wear clothes that are easy to get on and off and wear slip-on shoes.  Thank you for allowing [place] to care for you!   -- [facility] Invasive Cardiovascular services     Signed, Candee Furbish, MD  02/21/2019 11:54 AM    [place]

## 2019-02-21 NOTE — Patient Instructions (Addendum)
Medication Instructions:  Please start Aspirin 81 mg a day.  Start Crestor 5 mg a day.  Continue all other medications as listed.  *If you need a refill on your cardiac medications before your next appointment, please call your pharmacy*  Lab Work: Please have blood work today (BMP, CBC) If you have labs (blood work) drawn today and your tests are completely normal, you will receive your results only by: Marland Kitchen MyChart Message (if you have MyChart) OR . A paper copy in the mail If you have any lab test that is abnormal or we need to change your treatment, we will call you to review the results.  You will need COVID screening before your heart cath. This will be completed at [address] - stay in the right hand left for procedures (under the brick awning)  Testing/Procedures: Your physician has requested that you have a cardiac catheterization. Cardiac catheterization is used to diagnose and/or treat various heart conditions. Doctors may recommend this procedure for a number of different reasons. The most common reason is to evaluate chest pain. Chest pain can be a symptom of coronary artery disease (CAD), and cardiac catheterization can show whether plaque is narrowing or blocking your heart's arteries. This procedure is also used to evaluate the valves, as well as measure the blood flow and oxygen levels in different parts of your heart. For further information please visit HugeFiesta.tn. Please follow instruction sheet, as given.  Follow-Up: Follow up will be determined after the above testing.  Thank you for choosing [facility]!!      [facility] OFFICE [place], [place] [address] Dept: [phone] Loc: [phone]  DELTA ISING  02/21/2019  You are scheduled for a cardiac cath on Monday, November 9 with Dr. Saunders Revel.  1. Please arrive at the [facility] (Main  Entrance A) at [facility]: [place], [place] [address] at 5:30 am (two hours before your procedure to ensure your preparation). Free valet parking service is available.   Special note: Every effort is made to have your procedure done on time. Please understand that emergencies sometimes delay scheduled procedures.  2. Diet: Nothing after midnight  3. Labs: please gave today (CBC/BMP)  COVID screening on Friday 02/25/2019 at [place].  4. Medication instructions in preparation for your procedure:  On the morning of your procedure, take your Asprin and any morning medicines NOT listed above.  You may use sips of water.  5. Plan for one night stay--bring personal belongings. 6. Bring a current list of your medications and current insurance cards. 7. You MUST have a responsible person to drive you home. 8. Someone MUST be with you the first 24 hours after you arrive home or your discharge will be delayed. 9. Please wear clothes that are easy to get on and off and wear slip-on shoes.  Thank you for allowing [place] to care for you!   -- [facility] Invasive Cardiovascular services

## 2019-02-22 ENCOUNTER — Telehealth: Payer: Self-pay | Admitting: *Deleted

## 2019-02-22 LAB — CBC
Hematocrit: 38.9 % (ref 37.5–51.0)
Hemoglobin: 13.4 g/dL (ref 13.0–17.7)
MCH: 30.6 pg (ref 26.6–33.0)
MCHC: 34.4 g/dL (ref 31.5–35.7)
MCV: 89 fL (ref 79–97)
Platelets: 238 10*3/uL (ref 150–450)
RBC: 4.38 x10E6/uL (ref 4.14–5.80)
RDW: 13.4 % (ref 11.6–15.4)
WBC: 8.1 10*3/uL (ref 3.4–10.8)

## 2019-02-22 LAB — BASIC METABOLIC PANEL
BUN/Creatinine Ratio: 13 (ref 10–24)
BUN: 29 mg/dL — ABNORMAL HIGH (ref 8–27)
CO2: 22 mmol/L (ref 20–29)
Calcium: 9.6 mg/dL (ref 8.6–10.2)
Chloride: 106 mmol/L (ref 96–106)
Creatinine, Ser: 2.2 mg/dL — ABNORMAL HIGH (ref 0.76–1.27)
GFR calc Af Amer: 35 mL/min/{1.73_m2} — ABNORMAL LOW (ref >59)
GFR calc non Af Amer: 30 mL/min/{1.73_m2} — ABNORMAL LOW (ref >59)
Glucose: 84 mg/dL (ref 65–99)
Potassium: 5 mmol/L (ref 3.5–5.2)
Sodium: 142 mmol/L (ref 134–144)

## 2019-02-22 NOTE — Telephone Encounter (Signed)
Notes recorded by Jerline Pain, MD on 02/22/2019 at 8:54 AM EST  Creatinine 2.2, prior one month 1.5, 8 months ago 1.96.  I would like to bring him in for overnight hydration prior to cath.    I spoke with Dawn in patient placement at [facility], reservation made  for 24 hours observation for Sunday November 8 prior to cath on Monday November 9, they will call patient when the bed is ready.  Melina Copa, PA, weekend call APP notified.   Pt's wife (pt asked me to discuss with her) is aware that I have made arrangements for pt to go to [facility] on Sunday November 8 for overnight hydration prior to cath on Monday February 28, 2019. Pt's wife aware they should get a call from [facility] when the bed in ready on Sunday.  Due to family obligations, pt is unable to have Covid-19 test done before Friday November 6.  I have spoken with Janett Billow at [facility] test site, she is aware that pt will be going to [facility] on Sunday.

## 2019-02-22 NOTE — Telephone Encounter (Signed)
Pt's wife advised pt should avoid NSAIDs day before and day of procedure.

## 2019-02-25 ENCOUNTER — Other Ambulatory Visit (HOSPITAL_COMMUNITY)
Admission: RE | Admit: 2019-02-25 | Discharge: 2019-02-25 | Disposition: A | Discharge status: Home / Self Care | Payer: Medicare Other | Source: Ambulatory Visit | Attending: Internal Medicine | Admitting: Internal Medicine

## 2019-02-25 DIAGNOSIS — Z01812 Encounter for preprocedural laboratory examination: Secondary | ICD-10-CM | POA: Insufficient documentation

## 2019-02-25 DIAGNOSIS — Z20828 Contact with and (suspected) exposure to other viral communicable diseases: Secondary | ICD-10-CM | POA: Diagnosis not present

## 2019-02-25 LAB — SARS CORONAVIRUS 2 (TAT 6-24 HRS): SARS Coronavirus 2: NEGATIVE

## 2019-02-27 ENCOUNTER — Observation Stay (HOSPITAL_COMMUNITY)
Admission: RE | Admit: 2019-02-27 | Discharge: 2019-02-28 | Disposition: A | Discharge status: Home / Self Care | Payer: Medicare Other | Attending: Cardiology | Admitting: Cardiology

## 2019-02-27 ENCOUNTER — Encounter (HOSPITAL_COMMUNITY): Payer: Self-pay | Admitting: Physician Assistant

## 2019-02-27 DIAGNOSIS — I209 Angina pectoris, unspecified: Secondary | ICD-10-CM | POA: Diagnosis not present

## 2019-02-27 DIAGNOSIS — K219 Gastro-esophageal reflux disease without esophagitis: Secondary | ICD-10-CM | POA: Diagnosis not present

## 2019-02-27 DIAGNOSIS — F329 Major depressive disorder, single episode, unspecified: Secondary | ICD-10-CM | POA: Diagnosis not present

## 2019-02-27 DIAGNOSIS — E785 Hyperlipidemia, unspecified: Secondary | ICD-10-CM | POA: Insufficient documentation

## 2019-02-27 DIAGNOSIS — Z86711 Personal history of pulmonary embolism: Secondary | ICD-10-CM | POA: Diagnosis not present

## 2019-02-27 DIAGNOSIS — N183 Chronic kidney disease, stage 3 unspecified: Secondary | ICD-10-CM | POA: Insufficient documentation

## 2019-02-27 DIAGNOSIS — G20 Parkinson's disease: Secondary | ICD-10-CM | POA: Diagnosis present

## 2019-02-27 DIAGNOSIS — Z79899 Other long term (current) drug therapy: Secondary | ICD-10-CM | POA: Diagnosis not present

## 2019-02-27 DIAGNOSIS — I2510 Atherosclerotic heart disease of native coronary artery without angina pectoris: Secondary | ICD-10-CM

## 2019-02-27 DIAGNOSIS — I951 Orthostatic hypotension: Secondary | ICD-10-CM | POA: Diagnosis present

## 2019-02-27 DIAGNOSIS — R079 Chest pain, unspecified: Secondary | ICD-10-CM | POA: Diagnosis present

## 2019-02-27 DIAGNOSIS — Z7982 Long term (current) use of aspirin: Secondary | ICD-10-CM | POA: Insufficient documentation

## 2019-02-27 DIAGNOSIS — R0609 Other forms of dyspnea: Secondary | ICD-10-CM | POA: Insufficient documentation

## 2019-02-27 HISTORY — DX: Personal history of other specified conditions: Z87.898

## 2019-02-27 HISTORY — DX: Chronic kidney disease, stage 3 unspecified: N18.30

## 2019-02-27 LAB — CBC WITH DIFFERENTIAL/PLATELET
Abs Immature Granulocytes: 0.03 10*3/uL (ref 0.00–0.07)
Basophils Absolute: 0.1 10*3/uL (ref 0.0–0.1)
Basophils Relative: 1 %
Eosinophils Absolute: 0.1 10*3/uL (ref 0.0–0.5)
Eosinophils Relative: 2 %
HCT: 39.4 % (ref 39.0–52.0)
Hemoglobin: 13.1 g/dL (ref 13.0–17.0)
Immature Granulocytes: 1 %
Lymphocytes Relative: 26 %
Lymphs Abs: 1.6 10*3/uL (ref 0.7–4.0)
MCH: 31.4 pg (ref 26.0–34.0)
MCHC: 33.2 g/dL (ref 30.0–36.0)
MCV: 94.5 fL (ref 80.0–100.0)
Monocytes Absolute: 0.6 10*3/uL (ref 0.1–1.0)
Monocytes Relative: 9 %
Neutro Abs: 3.8 10*3/uL (ref 1.7–7.7)
Neutrophils Relative %: 61 %
Platelets: 220 10*3/uL (ref 150–400)
RBC: 4.17 MIL/uL — ABNORMAL LOW (ref 4.22–5.81)
RDW: 14 % (ref 11.5–15.5)
WBC: 6.2 10*3/uL (ref 4.0–10.5)
nRBC: 0 % (ref 0.0–0.2)

## 2019-02-27 LAB — BASIC METABOLIC PANEL
Anion gap: 9 (ref 5–15)
BUN: 32 mg/dL — ABNORMAL HIGH (ref 8–23)
CO2: 21 mmol/L — ABNORMAL LOW (ref 22–32)
Calcium: 9.2 mg/dL (ref 8.9–10.3)
Chloride: 112 mmol/L — ABNORMAL HIGH (ref 98–111)
Creatinine, Ser: 2.16 mg/dL — ABNORMAL HIGH (ref 0.61–1.24)
GFR calc Af Amer: 35 mL/min — ABNORMAL LOW (ref >60)
GFR calc non Af Amer: 31 mL/min — ABNORMAL LOW (ref >60)
Glucose, Bld: 102 mg/dL — ABNORMAL HIGH (ref 70–99)
Potassium: 4.7 mmol/L (ref 3.5–5.1)
Sodium: 142 mmol/L (ref 135–145)

## 2019-02-27 LAB — HIV ANTIBODY (ROUTINE TESTING W REFLEX): HIV Screen 4th Generation wRfx: NONREACTIVE

## 2019-02-27 MED ORDER — SODIUM CHLORIDE 0.9% FLUSH
3.0000 mL | Freq: Two times a day (BID) | INTRAVENOUS | Status: DC
  Administered 2019-02-27: 3 mL via INTRAVENOUS

## 2019-02-27 MED ORDER — AMANTADINE HCL 100 MG PO CAPS
100.0000 mg | ORAL_CAPSULE | Freq: Two times a day (BID) | ORAL | Status: DC
  Administered 2019-02-27 – 2019-02-28 (×3): 100 mg via ORAL
  Filled 2019-02-27 (×4): qty 1

## 2019-02-27 MED ORDER — ONDANSETRON HCL 4 MG/2ML IJ SOLN: 4.0000 mg | Freq: Four times a day (QID) | INTRAMUSCULAR | Status: DC | PRN

## 2019-02-27 MED ORDER — SODIUM CHLORIDE 0.9% FLUSH: 3.0000 mL | INTRAVENOUS | Status: DC | PRN

## 2019-02-27 MED ORDER — MIRABEGRON ER 25 MG PO TB24
50.0000 mg | ORAL_TABLET | Freq: Every day | ORAL | Status: DC
  Administered 2019-02-27 – 2019-02-28 (×2): 50 mg via ORAL
  Filled 2019-02-27 (×2): qty 2

## 2019-02-27 MED ORDER — SODIUM CHLORIDE 0.9 % IV SOLN
INTRAVENOUS | Status: DC
  Administered 2019-02-27: 16:00:00 via INTRAVENOUS

## 2019-02-27 MED ORDER — EZETIMIBE 10 MG PO TABS
10.0000 mg | ORAL_TABLET | Freq: Every day | ORAL | Status: DC
  Administered 2019-02-28: 10:00:00 10 mg via ORAL
  Filled 2019-02-27: qty 1

## 2019-02-27 MED ORDER — SODIUM CHLORIDE 0.9 % IV SOLN: 250.0000 mL | INTRAVENOUS | Status: DC | PRN

## 2019-02-27 MED ORDER — ROSUVASTATIN CALCIUM 5 MG PO TABS
5.0000 mg | ORAL_TABLET | Freq: Every day | ORAL | Status: DC
  Administered 2019-02-28: 10:00:00 5 mg via ORAL
  Filled 2019-02-27: qty 1

## 2019-02-27 MED ORDER — BUPROPION HCL ER (XL) 150 MG PO TB24
150.0000 mg | ORAL_TABLET | Freq: Every day | ORAL | Status: DC
  Administered 2019-02-28: 10:00:00 150 mg via ORAL
  Filled 2019-02-27: qty 1

## 2019-02-27 MED ORDER — CARBIDOPA-LEVODOPA 25-100 MG PO TABS
1.0000 | ORAL_TABLET | Freq: Every day | ORAL | Status: DC
  Administered 2019-02-27 – 2019-02-28 (×6): 1 via ORAL
  Filled 2019-02-27 (×6): qty 1

## 2019-02-27 MED ORDER — ACETAMINOPHEN 500 MG PO TABS: 1000.0000 mg | ORAL_TABLET | Freq: Four times a day (QID) | ORAL | Status: DC | PRN

## 2019-02-27 MED ORDER — NITROGLYCERIN 0.4 MG SL SUBL: 0.4000 mg | SUBLINGUAL_TABLET | SUBLINGUAL | Status: DC | PRN

## 2019-02-27 MED ORDER — HEPARIN SODIUM (PORCINE) 5000 UNIT/ML IJ SOLN
5000.0000 [IU] | Freq: Three times a day (TID) | INTRAMUSCULAR | Status: DC
  Administered 2019-02-27: 5000 [IU] via SUBCUTANEOUS
  Filled 2019-02-27: qty 1

## 2019-02-27 MED ORDER — MIDODRINE HCL 5 MG PO TABS: 5.0000 mg | ORAL_TABLET | Freq: Three times a day (TID) | ORAL | Status: DC

## 2019-02-27 MED ORDER — ASPIRIN 81 MG PO CHEW
81.0000 mg | CHEWABLE_TABLET | ORAL | Status: AC
  Administered 2019-02-28: 05:00:00 81 mg via ORAL
  Filled 2019-02-27: qty 1

## 2019-02-27 MED ORDER — ASPIRIN EC 81 MG PO TBEC: 81.0000 mg | DELAYED_RELEASE_TABLET | Freq: Every day | ORAL | Status: DC

## 2019-02-27 NOTE — H&P (Addendum)
Cardiology History & Physical    Patient ID: LEART DUKE MRN: [id number]; DOB: [birth year]   Admission date: 02/27/2019  Primary Care Provider: Biagio Borg, MD Primary Cardiologist: Candee Furbish, MD  Primary Electrophysiologist:  None   Chief Complaint:  Here for heart cath (recent chest pressure and shortness of breath)  Patient Profile:   John Herring is a 68 y.o. male with bilateral PEs 2018 after flight to [place] (anticoag for 6 months), Parkinson's disease post deep brain stimulator with hypotension on midodrine, CKD stage III by labs, depression, enlarged prostate, HLD, memory loss, pseudobulbar affect, former ETOH abuse, & coronary calcification who presents for pre-cath hydration in anticipation of planned cardiac catheterization tomorrow.  History of Present Illness:   He recently saw Dr. Marlou Porch as outpatient for chest pain. This pressure initially woke him up in late September with a chest pressure sensation. His blood pressure was elevated and he was seen in the ED. He ruled out for MI. D-dimer was elevated. He had a CTA that showed multiple subacute and chronic left-sided rib fractures but no PE or dissection. Since that time he had noticed pressure like chest discomfort with activity as well as dyspnea on exertion. Dr. Marlou Porch reviewed CT scan and felt that it showed a dense focal calcification at the LAD at the origin of the LAD after it bifurcates from the long left main artery. He recommended arranging outpatient catheterization. The patient's pre-cath labs showed a Cr of 2.2 (1.5 1 month ago, 1.96 8 months prior) so he was admitted today for overnight hydration and cath tomorrow. Pre-cath Covid test was negative. He is pain free today. He denies any orthopnea or edema. Wife is at bedside as well. He has a kyphotic posture but states he will be able to lie flat for procedure.   Past Medical History:  Diagnosis Date  . Arthritis   . CKD (chronic kidney disease), stage  III   . Depression 05/06/2011  . Enlarged prostate   . Former consumption of alcohol   . GERD (gastroesophageal reflux disease) 12/02/2012   patient denies this dx   . History of kidney stones    passed stone  . Hyperlipidemia 05/06/2011  . Hypersomnia 05/06/2011  . Hypotension   . 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[place]) 05/06/2011  . Memory loss 07/12/2012  . 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 Skin cancer 05/06/2011  . Syncope    orthostatic syncope, no problems in tha last 3-4 months    Past Surgical History:  Procedure Laterality Date  . BASAL CELL CARCINOMA EXCISION N/A 03/15/2018   Procedure: RE EXCISION BACK LESION;  Surgeon: Irene Limbo, MD;  Location: [facility];  Service: Plastics;  Laterality: N/A;  . COLONOSCOPY  2013  . deep brain stimulation     Parkinson's disease  . MASS EXCISION N/A 02/12/2018   Procedure: excision lesion back, layered closure 15 cm;  Surgeon: Irene Limbo, MD;  Location: [facility];  Service: Plastics;  Laterality: N/A;  . SKIN SPLIT GRAFT Left 03/15/2018   Procedure: SKIN GRAFT FROM RIGHT OR LEFT THIGH;  Surgeon: Irene Limbo, MD;  Location: [facility];  Service: Plastics;  Laterality: Left;  . SUBTHALAMIC STIMULATOR BATTERY REPLACEMENT N/A 09/01/2012   Procedure: SUBTHALAMIC STIMULATOR BATTERY REPLACEMENT;  Surgeon: Erline Levine, MD;  Location: [facility];  Service: Neurosurgery;  Laterality: N/A;  Deep Brain Stimulator battery change  . SUBTHALAMIC STIMULATOR BATTERY REPLACEMENT N/A 07/19/2015   Procedure: Deep Brain  stimulator battery replacement;  Surgeon: Erline Levine, MD;  Location: [facility];  Service: Neurosurgery;  Laterality: N/A;  Deep Brain stimulator battery replacement  . SUBTHALAMIC STIMULATOR BATTERY REPLACEMENT N/A 01/20/2019   Procedure: Deep brain stimulator battery change;  Surgeon: Erline Levine, MD;  Location: [facility];  Service: Neurosurgery;  Laterality: N/A;  Deep brain  stimulator battery change  . TONSILLECTOMY     As a child     Medications Prior to Admission: Prior to Admission medications   Medication Sig Start Date End Date Taking? Authorizing Provider  acetaminophen (TYLENOL) 500 MG tablet Take 1,000 mg by mouth every 6 (six) hours as needed for mild pain.   Yes [provider]  amantadine (SYMMETREL) 100 MG capsule Take 100 mg by mouth 2 (two) times daily.    Yes [provider]  aspirin EC 81 MG tablet Take 1 tablet (81 mg total) by mouth daily. 02/21/19  Yes Jerline Pain, MD  buPROPion (WELLBUTRIN XL) 150 MG 24 hr tablet Take 150 mg by mouth daily. 02/07/19  Yes [provider]  carbidopa-levodopa (SINEMET IR) 25-100 MG tablet Take 1 tablet by mouth 6 (six) times daily. Taking 1 tablet every 2-3 hours daily   Yes [provider]  ezetimibe (ZETIA) 10 MG tablet TAKE 1 TABLET BY MOUTH EVERY DAY 12/07/18  Yes Biagio Borg, MD  HYDROcodone-acetaminophen (NORCO) 5-325 MG tablet Take 1 tablet by mouth every 4 (four) hours as needed for moderate pain. 03/15/18  Yes Irene Limbo, MD  ibuprofen (ADVIL,MOTRIN) 800 MG tablet Take 800 mg by mouth every 8 (eight) hours as needed for moderate pain.    Yes [provider]  meloxicam (MOBIC) 15 MG tablet TAKE 1 TABLET BY MOUTH EVERY DAY 12/07/18  Yes Biagio Borg, MD  midodrine (PROAMATINE) 5 MG tablet Take 5 mg by mouth 3 (three) times daily with meals.   Yes [provider]  mirabegron ER (MYRBETRIQ) 50 MG TB24 tablet Take 50 mg by mouth daily.   Yes [provider]  rosuvastatin (CRESTOR) 5 MG tablet Take 1 tablet (5 mg total) by mouth daily. 02/21/19  Yes Jerline Pain, MD     Allergies:    Allergies  Allergen Reactions  . Statins     Makes parkinson worse, blood pressure drop    Social History:   Social History   Socioeconomic History  . Marital status: Married    Spouse name: Not on file  . Number of children: 2  . Years of  education: 28  . Highest education level: Not on file  Occupational History  . Occupation: Disabled  Social Needs  . Financial resource strain: Not on file  . Food insecurity    Worry: Not on file    Inability: Not on file  . Transportation needs    Medical: Not on file    Non-medical: Not on file  Tobacco Use  . Smoking status: Never Smoker  . Smokeless tobacco: Never Used  Substance and Sexual Activity  . Alcohol use: No    Comment: heavy drinker at times in the past. None for 5 years  . Drug use: No  . Sexual activity: Not on file  Lifestyle  . Physical activity    Days per week: Not on file    Minutes per session: Not on file  . Stress: Not on file  Relationships  . Social connections    Talks on phone: Not on file  Gets together: Not on file    Attends religious service: Not on file    Active member of club or organization: Not on file    Attends meetings of clubs or organizations: Not on file    Relationship status: Not on file  . Intimate partner violence    Fear of current or ex partner: Not on file    Emotionally abused: Not on file    Physically abused: Not on file    Forced sexual activity: Not on file  Other Topics Concern  . Not on file  Social History Narrative   Lives with wife     Family History:   The patient's family history is negative for Colon cancer.    ROS:  Please see the history of present illness.  All other ROS reviewed and negative.     Physical Exam/Data:  There were no vitals filed for this visit. No intake or output data in the 24 hours ending 02/27/19 1250 Last 3 Weights 02/21/2019 01/20/2019 01/19/2019  Weight (lbs) 177 lb 9.6 oz 184 lb 15.5 oz 185 lb  Weight (kg) 80.559 kg 83.9 kg 83.915 kg     There is no height or weight on file to calculate BMI.  Wt Readings from Last 3 Encounters:  02/21/19 80.6 kg  01/20/19 83.9 kg  01/13/19 83.9 kg    Physical Exam: General: Well developed, well nourished WM, in no acute  distress. Head: Normocephalic, atraumatic, sclera non-icteric, no xanthomas, nares are without discharge.  Neck: Negative for carotid bruits. JVD not elevated. Lungs: Clear bilaterally to auscultation without wheezes, rales, or rhonchi. Breathing is unlabored. Heart: RRR with S1 S2. No murmurs, rubs, or gallops appreciated. Abdomen: Soft, non-tender, non-distended with normoactive bowel sounds. No hepatomegaly. No rebound/guarding. No obvious abdominal masses. Msk: Kyphotic posture Extremities: No clubbing or cyanosis. No edema.  Distal pedal pulses are 2+ and equal bilaterally. Neuro: Alert and oriented X 3. No focal deficit. No facial asymmetry. Moves all extremities spontaneously. Psych:  Responds to questions appropriately with a normal affect, quiet    EKG:  The ECG that was done 01/13/19 was personally reviewed and demonstrates NSR 81bpm with changes suggestive of LVH otherwise nonacute  Relevant CV Studies: Scanned echo from 2018 is in [place] (unable to copy/paste due to scan)  Laboratory Data:  High Sensitivity Troponin:  No results for input(s): TROPONINIHS in the last 720 hours.    Cardiac EnzymesNo results for input(s): TROPONINI in the last 168 hours. No results for input(s): TROPIPOC in the last 168 hours.  Chemistry Recent Labs  Lab 02/21/19 1149  NA 142  K 5.0  CL 106  CO2 22  GLUCOSE 84  BUN 29*  CREATININE 2.20*  CALCIUM 9.6  GFRNONAA 30*  GFRAA 35*    Hematology Recent Labs  Lab 02/21/19 1149  WBC 8.1  RBC 4.38  HGB 13.4  HCT 38.9  MCV 89  MCH 30.6  MCHC 34.4  RDW 13.4  PLT 238   Radiology/Studies:  No results found.  Assessment and Plan   1. Chest pain/dyspnea concerning for unstable angina - plan cath in AM. Dr. Marlou Porch has discussed risks/benefits with patient. Questions answered today. Will initiate standard precath orders but include pre-cath hydration to begin this afternoon. Would avoid LV gram with his cath given his renal insufficiency.  We can check an echo while he is here. He had an echo in 2018 but the results are in [place] and symptoms have progressed since then. Will  obtain updated pre-cath EKG to have on file as well.  2. AKI on CKD stage III - prior baseline Cr is variable, 1.96 in 06/2018, 1.53 in 12/2018, and 2.2 on labs 02/2019. Recheck BMET on admission as well as tomorrow. Hydrate pre-cath. Will discontinue NSAIDS altogether for now - he will need to f/u with primary care for alternative that is safer on his kidneys. Preliminarily discussed with patient/wife at bedside. Would avoid LV gram with his cath.   3. Hyperlipidemia - LDL 06/2018 was 154. Dr. Marlou Porch recently started him on low dose Crestor. He was previously on atorvastatin 20mg  and had issues with hypotension. We suspect the hypotension was more related to Parkinson's than the statin but nevertheless, Crestor can be further titrated in the outpatient setting, making sure to be cognizant of limitation of dose with renal insufficiency.  4. Parkinson's disease - per Dr. Marlou Porch note, has associated orthostatic blood pressure issues and some chronic hypotension for which he takes midodrine. He has had a few falls which has caused some left rib scarring.  After long discussion with him and his wife, it was felt he would be able to comply with angiogram. Continue home regimen.  Severity of Illness: The appropriate patient status for this patient is OBSERVATION. Observation status is judged to be reasonable and necessary in order to provide the required intensity of service to ensure the patient's safety. The patient's presenting symptoms, physical exam findings, and initial radiographic and laboratory data in the context of their medical condition is felt to place them at decreased risk for further clinical deterioration. Furthermore, it is anticipated that the patient will be medically stable for discharge from the hospital within 2 midnights of admission. The following factors  support the patient status of observation.   " The patient's presenting symptoms include chest pressure/dyspnea on exertion. " The physical exam findings include unremarkable. " The initial radiographic and laboratory data are demonstrating acute on chronic kidney disease for which we are gettin  For questions or updates, please contact [place] Please consult [email or web address] for contact info under   Signed, Charlie Pitter, PA-C 02/27/2019, 12:50 PM  Patient examined chart reviewed. Discussed care with patient and wife. Exam with cervical kyphosis, head bobbing tremor Parkinson's. Clear lungs no murmur no edema soft abdomen and good right radial pulse. Baseline Cr around 1.6 elevated to 2.2 recently admitted for hydration pre cath with Dr End tomorrow Limited dye ? No LV gram may be worthwhile to check echo while in hospital   [facility]

## 2019-02-27 NOTE — Progress Notes (Signed)
Rechecked B/P, left 127/84. Pt denies any Chest pressure, HA or dizziness. Previous B/P at 4:40pm was 182/105 & 191/105, PA was paged. Will continue to monitor B/P.

## 2019-02-27 NOTE — Progress Notes (Signed)
B/P check summary: See flow sheet: 4:40pm blood was elevated bilateral and PA paged, received order to hold Midodrine and reassess. 4:49- B/P in left arm 127/84. 5:20pm B/P in left arm was 120/90. 5:48- B/P was 81/59 in right arm and 86/63 in left arm. 6:35pm- B/P is  104/70 in left arm. Pt continues to deny any discomfort.

## 2019-02-27 NOTE — Progress Notes (Addendum)
Nurse paged for concern for elevated BP - 191/105 in the left and 182/105 in the right. Patient has no acute complaints. He typically errs on the side of hypotension, including at recent OV and upon presentation to the hospital. I do see intermittent BP swings as high as 183/113 in 12/2018. He took midodrine last around 7:30 this AM, did not take 12:30 dose per wife. Will hold further midodrine and trend (next dose was queued to be given 20 minutes). May need to decrease baseline dose moving forward but would not reorder until BPs begin to trend back to baseline. I asked Tiffany to recheck BP in approximately a half hour to trend and notify team if still running high without any improvement. Dayna Dunn PA-C

## 2019-02-27 NOTE — Progress Notes (Addendum)
During interview patient asked for update of what exactly to expect tomorrow. Outlined procedure of cath. Risks and benefits of cardiac catheterization have been discussed with the patient. These include bleeding, infection, kidney damage, stroke, heart attack, death. The patient understands these risks and is willing to proceed. His wife gave me a list of his medications and I entered them in the computer as per his usual regimen. Per d/w Dr. Johnsie Cancel, would not need to repeat covid test since just done OP 11/6 - we will use 100cc/hr for hydration. Procedure is scheduled as first case, 35am with Dr. Saunders Revel. I sent staff message to early team to make sure they are aware to avoid LV gram with case. I confirmed with patient/wife that he wishes to be a full code. (He has wishes about avoiding further resuscitation if he should develop prolonged, protracted complications but this more in the context of if he should suffer a prolonged illness.) Melina Copa PA-C

## 2019-02-28 ENCOUNTER — Encounter (HOSPITAL_COMMUNITY): Payer: Self-pay | Admitting: General Practice

## 2019-02-28 ENCOUNTER — Encounter (HOSPITAL_COMMUNITY)
Admission: RE | Disposition: A | Discharge status: Home / Self Care | Payer: Self-pay | Source: Home / Self Care | Attending: Cardiology

## 2019-02-28 ENCOUNTER — Observation Stay (HOSPITAL_BASED_OUTPATIENT_CLINIC_OR_DEPARTMENT_OTHER): Payer: Medicare Other

## 2019-02-28 ENCOUNTER — Other Ambulatory Visit: Payer: Self-pay

## 2019-02-28 ENCOUNTER — Other Ambulatory Visit (HOSPITAL_COMMUNITY): Payer: Medicare Other

## 2019-02-28 DIAGNOSIS — R079 Chest pain, unspecified: Secondary | ICD-10-CM | POA: Diagnosis not present

## 2019-02-28 DIAGNOSIS — I209 Angina pectoris, unspecified: Secondary | ICD-10-CM | POA: Diagnosis not present

## 2019-02-28 DIAGNOSIS — I2510 Atherosclerotic heart disease of native coronary artery without angina pectoris: Secondary | ICD-10-CM

## 2019-02-28 DIAGNOSIS — Z9889 Other specified postprocedural states: Secondary | ICD-10-CM | POA: Diagnosis not present

## 2019-02-28 HISTORY — PX: CARDIAC CATHETERIZATION: SHX172

## 2019-02-28 HISTORY — PX: LEFT HEART CATH AND CORONARY ANGIOGRAPHY: CATH118249

## 2019-02-28 LAB — BASIC METABOLIC PANEL
Anion gap: 8 (ref 5–15)
BUN: 28 mg/dL — ABNORMAL HIGH (ref 8–23)
CO2: 23 mmol/L (ref 22–32)
Calcium: 8.7 mg/dL — ABNORMAL LOW (ref 8.9–10.3)
Chloride: 109 mmol/L (ref 98–111)
Creatinine, Ser: 1.78 mg/dL — ABNORMAL HIGH (ref 0.61–1.24)
GFR calc Af Amer: 45 mL/min — ABNORMAL LOW (ref >60)
GFR calc non Af Amer: 39 mL/min — ABNORMAL LOW (ref >60)
Glucose, Bld: 95 mg/dL (ref 70–99)
Potassium: 4.4 mmol/L (ref 3.5–5.1)
Sodium: 140 mmol/L (ref 135–145)

## 2019-02-28 LAB — ECHOCARDIOGRAM COMPLETE
Height: 70 in
Weight: 2804.25 oz

## 2019-02-28 SURGERY — LEFT HEART CATH AND CORONARY ANGIOGRAPHY
Anesthesia: LOCAL

## 2019-02-28 MED ORDER — IOHEXOL 350 MG/ML SOLN
INTRAVENOUS | Status: DC | PRN
  Administered 2019-02-28: 47 mL

## 2019-02-28 MED ORDER — SODIUM CHLORIDE 0.9 % IV SOLN: INTRAVENOUS | Status: AC

## 2019-02-28 MED ORDER — MIDAZOLAM HCL 2 MG/2ML IJ SOLN
INTRAMUSCULAR | Status: DC | PRN
  Administered 2019-02-28: 0.5 mg via INTRAVENOUS

## 2019-02-28 MED ORDER — HEPARIN (PORCINE) IN NACL 1000-0.9 UT/500ML-% IV SOLN
INTRAVENOUS | Status: AC
  Filled 2019-02-28: qty 1000

## 2019-02-28 MED ORDER — VERAPAMIL HCL 2.5 MG/ML IV SOLN
INTRAVENOUS | Status: DC | PRN
  Administered 2019-02-28: 10 mL via INTRA_ARTERIAL

## 2019-02-28 MED ORDER — HEPARIN (PORCINE) IN NACL 1000-0.9 UT/500ML-% IV SOLN
INTRAVENOUS | Status: DC | PRN
  Administered 2019-02-28 (×2): 500 mL

## 2019-02-28 MED ORDER — SODIUM CHLORIDE 0.9 % IV SOLN: 250.0000 mL | INTRAVENOUS | Status: DC | PRN

## 2019-02-28 MED ORDER — SODIUM CHLORIDE 0.9% FLUSH: 3.0000 mL | Freq: Two times a day (BID) | INTRAVENOUS | Status: DC

## 2019-02-28 MED ORDER — VERAPAMIL HCL 2.5 MG/ML IV SOLN
INTRAVENOUS | Status: AC
  Filled 2019-02-28: qty 2

## 2019-02-28 MED ORDER — MIDAZOLAM HCL 2 MG/2ML IJ SOLN
INTRAMUSCULAR | Status: AC
  Filled 2019-02-28: qty 2

## 2019-02-28 MED ORDER — FENTANYL CITRATE (PF) 100 MCG/2ML IJ SOLN
INTRAMUSCULAR | Status: DC | PRN
  Administered 2019-02-28: 12.5 ug via INTRAVENOUS

## 2019-02-28 MED ORDER — HYDRALAZINE HCL 20 MG/ML IJ SOLN: 10.0000 mg | INTRAMUSCULAR | Status: AC | PRN

## 2019-02-28 MED ORDER — HEPARIN SODIUM (PORCINE) 1000 UNIT/ML IJ SOLN
INTRAMUSCULAR | Status: DC | PRN
  Administered 2019-02-28: 4000 [IU] via INTRAVENOUS

## 2019-02-28 MED ORDER — LIDOCAINE HCL (PF) 1 % IJ SOLN
INTRAMUSCULAR | Status: DC | PRN
  Administered 2019-02-28: 2 mL

## 2019-02-28 MED ORDER — LIDOCAINE HCL (PF) 1 % IJ SOLN
INTRAMUSCULAR | Status: AC
  Filled 2019-02-28: qty 30

## 2019-02-28 MED ORDER — SODIUM CHLORIDE 0.9 % IV SOLN
INTRAVENOUS | Status: AC | PRN
  Administered 2019-02-28: 250 mL via INTRAVENOUS
  Administered 2019-02-28: 125 mL/h via INTRAVENOUS

## 2019-02-28 MED ORDER — SODIUM CHLORIDE 0.9% FLUSH: 3.0000 mL | INTRAVENOUS | Status: DC | PRN

## 2019-02-28 MED ORDER — HEPARIN SODIUM (PORCINE) 1000 UNIT/ML IJ SOLN
INTRAMUSCULAR | Status: AC
  Filled 2019-02-28: qty 1

## 2019-02-28 MED ORDER — LABETALOL HCL 5 MG/ML IV SOLN: 10.0000 mg | INTRAVENOUS | Status: AC | PRN

## 2019-02-28 MED ORDER — FENTANYL CITRATE (PF) 100 MCG/2ML IJ SOLN
INTRAMUSCULAR | Status: AC
  Filled 2019-02-28: qty 2

## 2019-02-28 MED ORDER — HEPARIN SODIUM (PORCINE) 5000 UNIT/ML IJ SOLN: 5000.0000 [IU] | Freq: Three times a day (TID) | INTRAMUSCULAR | Status: DC

## 2019-02-28 SURGICAL SUPPLY — 12 items
CATH 5FR JL3.5 JR4 ANG PIG MP (CATHETERS) ×1 IMPLANT
CATH INFINITI 5 FR 3DRC (CATHETERS) ×1 IMPLANT
CATH OPTITORQUE TIG 4.0 5F (CATHETERS) ×1 IMPLANT
DEVICE RAD COMP TR BAND LRG (VASCULAR PRODUCTS) ×1 IMPLANT
GLIDESHEATH SLEND SS 6F .021 (SHEATH) ×1 IMPLANT
GUIDEWIRE INQWIRE 1.5J.035X260 (WIRE) IMPLANT
INQWIRE 1.5J .035X260CM (WIRE) ×2
KIT HEART LEFT (KITS) ×2 IMPLANT
PACK CARDIAC CATHETERIZATION (CUSTOM PROCEDURE TRAY) ×2 IMPLANT
TRANSDUCER W/STOPCOCK (MISCELLANEOUS) ×2 IMPLANT
TUBING CIL FLEX 10 FLL-RA (TUBING) ×2 IMPLANT
WIRE HI TORQ VERSACORE-J 145CM (WIRE) ×1 IMPLANT

## 2019-02-28 NOTE — Brief Op Note (Signed)
BRIEF CARDIAC CATHETERIZATION NOTE  02/28/2019  8:13 AM  PATIENT:  John Herring  67 y.o. male  PRE-OPERATIVE DIAGNOSIS:  Chest pain  POST-OPERATIVE DIAGNOSIS:  Chest pain  PROCEDURE:  Procedure(s): LEFT HEART CATH AND CORONARY ANGIOGRAPHY (N/A)  SURGEON:  Surgeon(s) and Role:    * Alexxia Stankiewicz, MD - Primary  FINDINGS: 1. Mild luminal irregularities.  No significant CAD. 2. Low normal LVEDP.  RECOMMENDATIONS: 1. Primary prevention of CAD. 2. Post-cath hydration. 3. Follow-up echocardiogram. 4. Anticipate d/c home this afternoon after 6 hours of post-cath hydration.  Nelva Bush, MD [facility] Pager: [phone]

## 2019-02-28 NOTE — Discharge Summary (Signed)
Discharge Summary    Patient ID: John Herring MRN: [id number]; DOB: 02/12/1951  Admit date: 02/27/2019 Discharge date: 02/28/2019  Primary Care Provider: Biagio Borg, MD  Primary Cardiologist: Candee Furbish, MD  Primary Electrophysiologist:  None   Discharge Diagnoses    Principal Problem:   Angina pectoris [facility]) Active Problems:   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John Herring)   Hyperlipidemia   Orthostatic hypotension   Coronary artery calcification seen on CT scan    Diagnostic Studies/Procedures    Cardiac cath 02/28/19  Conclusions: 1. Mild coronary plaquing.  No angiographically significant coronary artery disease to explain recent chest pain and fatigue. 2. Low normal left ventricular filling pressure. 3. Tortuous right subclavian artery limiting catheter manipulation.  Recommend alternative access should catheterization be needed in the future.  Recommendations: 1. Primary prevention of coronary artery disease. 2. Six hours of post catheterization hydration. 3. Follow-up transthoracic echocardiogram. 4. Likely discharge home later today if no post catheterization complications.  Echo pending _____________   Please note prior study labeled echocardiogram is venous doppler from [place].  History of Present Illness     John Herring is a 68 y.o. male with  bilateral PEs 2018 after flight to [place] (anticoag for 6 months), Parkinson's disease post deep brain stimulator with hypotension on midodrine, CKD stage III by labs, depression, enlarged prostate, HLD, memory loss, pseudobulbar affect,former ETOH abuse, &coronary calcification and brought in early for IV hydration for cath today.   Pt had been seen by Dr. Marlou Porch for chest pain.    prior baseline Cr is variable, 1.96 in 06/2018, 1.53 in 12/2018, and 2.2 on labs 02/2019.  Electively admitted to tele.  IV fluids overnight and had cardiac cath today.     Hospital Course     Consultants: none   Dr. Harrell Gave has  seen pt post cardiac cath with minimal CAD. Echo is pending.  Pt has done well with no chest pain.  Cr today 1.78.   He did have some HTN at times.   Pt is stable and and for discharge per Dr. Harrell Gave.  Echo pending, will have them do today prior to discharge.    Did the patient have an acute coronary syndrome (MI, NSTEMI, STEMI, etc) this admission?:  No                               Did the patient have a percutaneous coronary intervention (stent / angioplasty)?:  No.   _____________  Discharge Vitals Blood pressure (!) 146/89, pulse 74, temperature 98.6 F (37 C), temperature source Oral, resp. rate 16, height 5\' 10"  (1.778 m), weight 79.5 kg, SpO2 97 %.  Filed Weights   02/27/19 1312 02/27/19 1613 02/28/19 0522  Weight: 78.5 kg 78.9 kg 79.5 kg    Labs & Radiologic Studies    CBC Recent Labs    02/27/19 1419  WBC 6.2  NEUTROABS 3.8  HGB 13.1  HCT 39.4  MCV 94.5  PLT [id number]   Basic Metabolic Panel Recent Labs    02/27/19 1419 02/28/19 0410  NA 142 140  K 4.7 4.4  CL 112* 109  CO2 21* 23  GLUCOSE 102* 95  BUN 32* 28*  CREATININE 2.16* 1.78*  CALCIUM 9.2 8.7*   Liver Function Tests No results for input(s): AST, ALT, ALKPHOS, BILITOT, PROT, ALBUMIN in the last 72 hours. No results for input(s): LIPASE, AMYLASE in the last 72 hours. High Sensitivity Troponin:  No results for input(s): TROPONINIHS in the last 720 hours.  BNP Invalid input(s): POCBNP D-Dimer No results for input(s): DDIMER in the last 72 hours. Hemoglobin A1C No results for input(s): HGBA1C in the last 72 hours. Fasting Lipid Panel No results for input(s): CHOL, HDL, LDLCALC, TRIG, CHOLHDL, LDLDIRECT in the last 72 hours. Thyroid Function Tests No results for input(s): TSH, T4TOTAL, T3FREE, THYROIDAB in the last 72 hours.  Invalid input(s): FREET3 _____________  No results found. Disposition   Pt is being discharged home today in good condition.  Follow-up Plans & Appointments  Call [facility] at [phone] if any bleeding, swelling or drainage at cath site.  May shower, no tub baths for 48 hours for groin sticks. No lifting over 5 pounds for 3 days.  No Driving for 3 days if you drive  regular diet     Follow-up Information    Jerline Pain, MD Follow up on 03/08/2019.   Specialty: Cardiology Why: 10 AM with his PA Ermalinda Barrios in office. Contact information: [id number] [place] [address] [phone]            Discharge Medications   Allergies as of 02/28/2019      Reactions   Statins    Makes parkinson worse, blood pressure drop      Medication List    STOP taking these medications   ibuprofen 800 MG tablet Commonly known as: ADVIL     TAKE these medications   acetaminophen 500 MG tablet Commonly known as: TYLENOL Take 1,000 mg by mouth every 6 (six) hours as needed for mild pain.   amantadine 100 MG capsule Commonly known as: SYMMETREL Take 100 mg by mouth 2 (two) times daily.   aspirin EC 81 MG tablet Take 1 tablet (81 mg total) by mouth daily.   buPROPion 150 MG 24 hr tablet Commonly known as: WELLBUTRIN XL Take 150 mg by mouth daily.   carbidopa-levodopa 25-100 MG tablet Commonly known as: SINEMET IR Take 1 tablet by mouth 6 (six) times daily. Taking 1 tablet every 2-3 hours daily   ezetimibe 10 MG tablet Commonly known as: ZETIA TAKE 1 TABLET BY MOUTH EVERY DAY   HYDROcodone-acetaminophen 5-325 MG tablet Commonly known as: Norco Take 1 tablet by mouth every 4 (four) hours as needed for moderate pain.   meloxicam 15 MG tablet Commonly known as: MOBIC TAKE 1 TABLET BY MOUTH EVERY DAY   midodrine 5 MG tablet Commonly known as: PROAMATINE Take 5 mg by mouth 3 (three) times daily with meals.   Myrbetriq 50 MG Tb24 tablet Generic drug: mirabegron ER Take 50 mg by mouth daily.   rosuvastatin 5 MG tablet Commonly known as: CRESTOR Take 1 tablet (5 mg total) by mouth  daily.          Outstanding Labs/Studies   Needs BMP  Duration of Discharge Encounter   Greater than 30 minutes including physician time.  Signed, Cecilie Kicks, NP 02/28/2019, 2:57 PM

## 2019-02-28 NOTE — Interval H&P Note (Signed)
History and Physical Interval Note:  02/28/2019 7:29 AM  John Herring  has presented today for cardiac catheterization, with the diagnosis of chest pain.  The various methods of treatment have been discussed with the patient and family. After consideration of risks, benefits and other options for treatment, the patient has consented to  Procedure(s): LEFT HEART CATH AND CORONARY ANGIOGRAPHY (N/A) as a surgical intervention.  The patient's history has been reviewed, patient examined, no change in status, stable for surgery.  I have reviewed the patient's chart and labs.  Questions were answered to the patient's satisfaction.    Cath Lab Visit (complete for each Cath Lab visit)  Clinical Evaluation Leading to the Procedure:   ACS: No.  Non-ACS:    Anginal Classification: CCS IV  Anti-ischemic medical therapy: No Therapy  Non-Invasive Test Results: No non-invasive testing performed  Prior CABG: No previous CABG  [name]

## 2019-02-28 NOTE — Progress Notes (Signed)
Progress Note  Patient Name: John Herring Date of Encounter: 02/28/2019  Primary Cardiologist: Candee Furbish, MD   Subjective   No complaints, no chest pain or SOB, pressure held on Rt wrist.   Inpatient Medications    Scheduled Meds: . amantadine  100 mg Oral BID  . buPROPion  150 mg Oral Daily  . carbidopa-levodopa  1 tablet Oral 6 X Daily  . ezetimibe  10 mg Oral Daily  . heparin  5,000 Units Subcutaneous Q8H  . mirabegron ER  50 mg Oral Daily  . rosuvastatin  5 mg Oral Daily  . sodium chloride flush  3 mL Intravenous Q12H  . sodium chloride flush  3 mL Intravenous Q12H   Continuous Infusions: . sodium chloride    . sodium chloride     PRN Meds: sodium chloride, acetaminophen, hydrALAZINE, labetalol, nitroGLYCERIN, ondansetron (ZOFRAN) IV, sodium chloride flush   Vital Signs    Vitals:   02/28/19 0904 02/28/19 0920 02/28/19 0934 02/28/19 0949  BP: (!) 177/98 (!) 155/88 (!) 154/91 (!) 165/96  Pulse: 86 82 80 85  Resp:      Temp:      TempSrc:      SpO2: 97% 95% 96% 97%  Weight:      Height:        Intake/Output Summary (Last 24 hours) at 02/28/2019 1117 Last data filed at 02/28/2019 0825 Gross per 24 hour  Intake 1320.03 ml  Output 1305 ml  Net 15.03 ml   Last 3 Weights 02/28/2019 02/27/2019 02/27/2019  Weight (lbs) 175 lb 4.3 oz 173 lb 14.4 oz 173 lb  Weight (kg) 79.5 kg 78.881 kg 78.472 kg      Telemetry    SR with PACs at times - Personally Reviewed  ECG    SR with PAC and possible LVH, no acute ST changes - Personally Reviewed  Physical Exam   GEN: No acute distress.   Neck: No JVD Cardiac: RRR, no murmurs, rubs, or gallops.  Respiratory: Clear to auscultation bilaterally. GI: Soft, nontender, non-distended  MS: No edema; No deformity. Neuro:  Nonfocal  Psych: Normal affect   Labs    High Sensitivity Troponin:  No results for input(s): TROPONINIHS in the last 720 hours.    Chemistry Recent Labs  Lab 02/21/19 1149 02/27/19 1419  02/28/19 0410  NA 142 142 140  K 5.0 4.7 4.4  CL 106 112* 109  CO2 22 21* 23  GLUCOSE 84 102* 95  BUN 29* 32* 28*  CREATININE 2.20* 2.16* 1.78*  CALCIUM 9.6 9.2 8.7*  GFRNONAA 30* 31* 39*  GFRAA 35* 35* 45*  ANIONGAP  --  9 8     Hematology Recent Labs  Lab 02/21/19 1149 02/27/19 1419  WBC 8.1 6.2  RBC 4.38 4.17*  HGB 13.4 13.1  HCT 38.9 39.4  MCV 89 94.5  MCH 30.6 31.4  MCHC 34.4 33.2  RDW 13.4 14.0  PLT 238 220    BNPNo results for input(s): BNP, PROBNP in the last 168 hours.   DDimer No results for input(s): DDIMER in the last 168 hours.   Radiology    No results found.  Cardiac Studies   Echo pending   Cardiac cath 02/28/19 Conclusions: 1. Mild coronary plaquing.  No angiographically significant coronary artery disease to explain recent chest pain and fatigue. 2. Low normal left ventricular filling pressure. 3. Tortuous right subclavian artery limiting catheter manipulation.  Recommend alternative access should catheterization be needed in the future.  Recommendations: 1. Primary prevention of coronary artery disease. 2. Six hours of post catheterization hydration. 3. Follow-up transthoracic echocardiogram. 4. Likely discharge home later today if no post catheterization complications.   Patient Profile     68 y.o. 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 Chest pain/dyspnea was concerning for unstable angina -cath with no CAD.   Echo is ordered for LV eval, . He had an echo in 2018 but the results are in [place] and symptoms have progressed since then.   2. AKI on CKD stage III - prior baseline Cr is variable, 1.96 in 06/2018,  1.53 in 12/2018, and 2.2 on labs 02/2019. Yesterday Cr 2.16 and with hydration today 1.78 no LV gram done with cath due to this.   3. Hyperlipidemia - LDL 06/2018 was 154. Dr. Marlou Porch recently started him on low dose Crestor. He was previously on atorvastatin 20mg  and had issues with hypotension. We suspect the hypotension was more related to Parkinson's than the statin but nevertheless, Crestor can be further titrated in the outpatient setting, making sure to be cognizant of limitation of dose with renal insufficiency.  4. Parkinson's disease - per Dr. Marlou Porch note, has associated orthostatic blood pressure issues and some chronic hypotension for which he takesmidodrine.He has had a few falls which has caused some left rib scarring. Continue home regimen.  5.  Hypotension and hypertension yesterday.  And today 154/102   Probable discharge later today after echo vs arrange as outpt.   For questions or updates, please contact [place] Please consult [email or web address] for contact info under        Signed, Cecilie Kicks, NP  02/28/2019, 11:17 AM

## 2019-02-28 NOTE — Progress Notes (Signed)
    Paged by RN regarding discharge BP of 165/96. Per chart review, patient typically struggles with hypotension including a recent office visit and upon presentation to the hospital.  Is on home midodrine which has been held during his hospitalization.  Vitals reviewed. BP shows spikes with most recent recording at 146/89. Pt to keep BP log after discharge and if SBP sustained greater than 160-170, pt to call the office for further instruction. Has follow up with Estella Husk, PA on 03/08/19.   Kathyrn Drown NP-C [place] Pager: [phone]

## 2019-02-28 NOTE — Discharge Instructions (Signed)
Call [facility] at [phone] if any bleeding, swelling or drainage at cath site.  May shower, no tub baths for 48 hours for groin sticks. No lifting over 5 pounds for 3 days.  No Driving for 3 days if you drive  regular diet   No change in medications.

## 2019-02-28 NOTE — Progress Notes (Signed)
  Echocardiogram 2D Echocardiogram has been performed.  Bobbye Charleston 02/28/2019, 4:13 PM

## 2019-03-01 ENCOUNTER — Encounter (HOSPITAL_COMMUNITY): Payer: Self-pay | Admitting: Internal Medicine

## 2019-03-01 ENCOUNTER — Telehealth: Payer: Self-pay | Admitting: *Deleted

## 2019-03-01 NOTE — Telephone Encounter (Signed)
Pt was on TCM report admitted 02/27/19 for Angina pectoris. Pt underwent Cardiac cath 02/28/19 which showed Mild coronary plaquing. No angiographically significant coronary artery disease to explain recent chest pain and fatigue. Pt D/C 02/28/19, and will follow=up w/ cardiology.Marland KitchenJohny Chess

## 2019-03-07 ENCOUNTER — Other Ambulatory Visit: Payer: Self-pay

## 2019-03-07 DIAGNOSIS — Z20822 Contact with and (suspected) exposure to covid-19: Secondary | ICD-10-CM

## 2019-03-08 ENCOUNTER — Ambulatory Visit: Payer: Medicare Other | Admitting: Physician Assistant

## 2019-03-09 LAB — NOVEL CORONAVIRUS, NAA: SARS-CoV-2, NAA: NOT DETECTED

## 2019-03-09 NOTE — Progress Notes (Signed)
Cardiology Office Note    Date:  03/10/2019   ID:  AMICHAI ELKS, DOB [birth year], MRN [id number]  PCP:  Biagio Borg, MD  Cardiologist:  Dr.Skains  Chief Complaint: Hospital follow up   History of Present Illness:   John Herring is a 68 y.o. male with hx of parkinson's disease post deep brain stimulator with hypotension on midodrine, prior bilateral PEs approximately 2018 after long flight, family hx of CAD, CKD stage III seen for cath follow up.   Recently seen by Dr. Marlou Porch for chest pain. Review of prior CT of chest  (negative for PE) showed  dense focal calcification at the LAD at the origin of the LAD after it bifurcates from the long left main artery. He was admitted for pre-hydration. Cath showed minimal plaque. Echo showed preserved LV function. No evidence of left ventricular outflow tract gradient that would cause orthostatic hypotension or drop in blood pressure. BP elevated during admission.  Here today for follow up.  No recurrent chest pain.  It was felt that his chest pain could be due to lots of tremors while his battery of stimulator was dead.  No palpitation, dizziness, orthopnea, PND or syncope.  Past Medical History:  Diagnosis Date  . Arthritis   . CKD (chronic kidney disease), stage III   . Depression 05/06/2011  . Enlarged prostate   . Former consumption of alcohol   . GERD (gastroesophageal reflux disease) 12/02/2012   patient denies this dx   . History of kidney stones    passed stone  . Hyperlipidemia 05/06/2011  . Hypersomnia 05/06/2011  . Hypotension   . 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 Memory loss 07/12/2012  . 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 Skin cancer 05/06/2011  . Syncope    orthostatic syncope, no problems in tha last 3-4 months    Past Surgical History:  Procedure Laterality Date  . BASAL CELL CARCINOMA EXCISION N/A 03/15/2018   Procedure: RE EXCISION BACK LESION;  Surgeon: Irene Limbo, MD;  Location: [facility];  Service: Plastics;  Laterality: N/A;  . CARDIAC CATHETERIZATION  02/28/2019  . COLONOSCOPY  2013  . deep brain stimulation     Parkinson's disease  . LEFT HEART CATH AND CORONARY ANGIOGRAPHY N/A 02/28/2019   Procedure: LEFT HEART CATH AND CORONARY ANGIOGRAPHY;  Surgeon: Nelva Bush, MD;  Location: [facility];  Service: Cardiovascular;  Laterality: N/A;  . MASS EXCISION N/A 02/12/2018   Procedure: excision lesion back, layered closure 15 cm;  Surgeon: Irene Limbo, MD;  Location: [facility];  Service: Plastics;  Laterality: N/A;  . SKIN SPLIT GRAFT Left 03/15/2018   Procedure: SKIN GRAFT FROM RIGHT OR LEFT THIGH;  Surgeon: Irene Limbo, MD;  Location: [facility];  Service: Plastics;  Laterality: Left;  . SUBTHALAMIC STIMULATOR BATTERY REPLACEMENT N/A 09/01/2012   Procedure: SUBTHALAMIC STIMULATOR BATTERY REPLACEMENT;  Surgeon: Erline Levine, MD;  Location: [facility];  Service: Neurosurgery;  Laterality: N/A;  Deep Brain Stimulator battery change  . SUBTHALAMIC STIMULATOR BATTERY REPLACEMENT N/A 07/19/2015   Procedure: Deep Brain stimulator battery replacement;  Surgeon: Erline Levine, MD;  Location: [facility];  Service: Neurosurgery;  Laterality: N/A;  Deep Brain stimulator battery replacement  . SUBTHALAMIC STIMULATOR BATTERY REPLACEMENT N/A 01/20/2019   Procedure: Deep brain stimulator battery change;  Surgeon: Erline Levine, MD;  Location: [facility];  Service: Neurosurgery;  Laterality: N/A;  Deep brain stimulator battery change  . TONSILLECTOMY  As a child    Current Medications: Prior to Admission medications   Medication Sig Start Date End Date Taking? Authorizing Provider  acetaminophen (TYLENOL) 500 MG tablet Take 1,000 mg by mouth every 6 (six) hours as needed for mild pain.    [provider]  amantadine (SYMMETREL) 100 MG capsule Take 100 mg by mouth 2 (two) times daily.     [provider]   aspirin EC 81 MG tablet Take 1 tablet (81 mg total) by mouth daily. 02/21/19   Jerline Pain, MD  buPROPion (WELLBUTRIN XL) 150 MG 24 hr tablet Take 150 mg by mouth daily. 02/07/19   [provider]  carbidopa-levodopa (SINEMET IR) 25-100 MG tablet Take 1 tablet by mouth 6 (six) times daily. Taking 1 tablet every 2-3 hours daily    [provider]  ezetimibe (ZETIA) 10 MG tablet TAKE 1 TABLET BY MOUTH EVERY DAY 12/07/18   Biagio Borg, MD  HYDROcodone-acetaminophen (NORCO) 5-325 MG tablet Take 1 tablet by mouth every 4 (four) hours as needed for moderate pain. 03/15/18   Irene Limbo, MD  meloxicam (MOBIC) 15 MG tablet TAKE 1 TABLET BY MOUTH EVERY DAY 12/07/18   Biagio Borg, MD  midodrine (PROAMATINE) 5 MG tablet Take 5 mg by mouth 3 (three) times daily with meals.    [provider]  mirabegron ER (MYRBETRIQ) 50 MG TB24 tablet Take 50 mg by mouth daily.    [provider]  rosuvastatin (CRESTOR) 5 MG tablet Take 1 tablet (5 mg total) by mouth daily. 02/21/19   Jerline Pain, MD    Allergies:   Statins   Social History   Socioeconomic History  . Marital status: Married    Spouse name: Not on file  . Number of children: 2  . Years of education: 68  . Highest education level: Not on file  Occupational History  . Occupation: Disabled  Social Needs  . Financial resource strain: Not on file  . Food insecurity    Worry: Not on file    Inability: Not on file  . Transportation needs    Medical: Not on file    Non-medical: Not on file  Tobacco Use  . Smoking status: Never Smoker  . Smokeless tobacco: Never Used  Substance and Sexual Activity  . Alcohol use: No    Comment: heavy drinker at times in the past. None for 5 years  . Drug use: No  . Sexual activity: Not on file  Lifestyle  . Physical activity    Days per week: Not on file    Minutes per session: Not on file  . Stress: Not on file  Relationships  . Social Herbalist on  phone: Not on file    Gets together: Not on file    Attends religious service: Not on file    Active member of club or organization: Not on file    Attends meetings of clubs or organizations: Not on file    Relationship status: Not on file  Other Topics Concern  . Not on file  Social History Narrative   Lives with wife     Family History:  The patient's family history is not on file.   ROS:   Please see the history of present illness.    ROS All other systems reviewed and are negative.   PHYSICAL EXAM:   VS:  BP 116/74   Pulse 72   Ht 5\' 10"  (1.778  m)   Wt 177 lb 6.4 oz (80.5 kg)   SpO2 97%   BMI 25.45 kg/m    GEN: Well nourished, well developed, in no acute distress  HEENT: normal  Neck: no JVD, carotid bruits, or masses Cardiac: RRR; no murmurs, rubs, or gallops,no edema  Respiratory:  clear to auscultation bilaterally, normal work of breathing GI: soft, nontender, nondistended, + BS MS: no deformity or atrophy  Skin: warm and dry, no rash Neuro:  Alert and Oriented x 3, tremors Psych: euthymic mood, full affect  Wt Readings from Last 3 Encounters:  03/10/19 177 lb 6.4 oz (80.5 kg)  02/28/19 175 lb 4.3 oz (79.5 kg)  02/21/19 177 lb 9.6 oz (80.6 kg)      Studies/Labs Reviewed:   EKG:  EKG is not ordered today.    Recent Labs: 06/23/2018: ALT 4; TSH 2.21 02/27/2019: Hemoglobin 13.1; Platelets 220 02/28/2019: BUN 28; Creatinine, Ser 1.78; Potassium 4.4; Sodium 140   Lipid Panel    Component Value Date/Time   CHOL 220 (H) 06/23/2018 1603   TRIG 336.0 (H) 06/23/2018 1603   HDL 38.40 (L) 06/23/2018 1603   CHOLHDL 6 06/23/2018 1603   VLDL 67.2 (H) 06/23/2018 1603   LDLCALC 154 (H) 12/08/2014 1054   LDLDIRECT 154.0 06/23/2018 1603    Additional studies/ records that were reviewed today include:   Echocardiogram: 02/28/2019 1. Left ventricular ejection fraction, by visual estimation, is 55 to 60%. The left ventricle has normal function. There is moderately  increased left ventricular hypertrophy.  2. Asymmetric hypertrophy measuring 14 mm in basal septum (6mm in posterior wall)  3. Global right ventricle has normal systolic function.The right ventricular size is normal. No increase in right ventricular wall thickness.  4. Left atrial size was normal.  5. Right atrial size was normal.  6. The mitral valve is normal in structure. No evidence of mitral valve regurgitation.  7. The tricuspid valve is normal in structure. Tricuspid valve regurgitation is trivial.  8. The aortic valve is tricuspid. Aortic valve regurgitation is not visualized. No evidence of aortic valve sclerosis or stenosis.  9. The pulmonic valve was not well visualized. Pulmonic valve regurgitation is not visualized. 10. TR signal is inadequate for assessing pulmonary artery systolic pressure.  Cardiac Catheterization: 02/28/2019 LEFT HEART CATH AND CORONARY ANGIOGRAPHY  Conclusion  Conclusions: 1. Mild coronary plaquing.  No angiographically significant coronary artery disease to explain recent chest pain and fatigue. 2. Low normal left ventricular filling pressure. 3. Tortuous right subclavian artery limiting catheter manipulation.  Recommend alternative access should catheterization be needed in the future.  Recommendations: 1. Primary prevention of coronary artery disease. 2. Six hours of post catheterization hydration. 3. Follow-up transthoracic echocardiogram. 4. Likely discharge home later today if no post catheterization complications.   ASSESSMENT & PLAN:   1.  Chest pain/family history of CAD -No recurrent chest pain. Felt Musculoskeletal due to tremors.  No further work-up.  Echocardiogram reassuring as above.    Medication Adjustments/Labs and Tests Ordered: Current medicines are reviewed at length with the patient today.  Concerns regarding medicines are outlined above.  Medication changes, Labs and Tests ordered today are listed in the Patient Instructions  below. Patient Instructions  Medication Instructions:  Your physician recommends that you continue on your current medications as directed. Please refer to the Current Medication list given to you today.  *If you need a refill on your cardiac medications before your next appointment, please call your pharmacy*  Lab Work: None  If you have labs (blood work) drawn today and your tests are completely normal, you will receive your results only by: Marland Kitchen MyChart Message (if you have MyChart) OR . A paper copy in the mail If you have any lab test that is abnormal or we need to change your treatment, we will call you to review the results.  Testing/Procedures: None   Follow-Up: Follow up with our office as needed   Other Instructions      Jarrett Soho, [place]  03/10/2019 1:45 PM    [facility] [address], [place], [place]  [address] Phone: [phone]; Fax: [phone]

## 2019-03-10 ENCOUNTER — Ambulatory Visit: Payer: Medicare Other | Admitting: Physician Assistant

## 2019-03-10 ENCOUNTER — Other Ambulatory Visit: Payer: Self-pay

## 2019-03-10 ENCOUNTER — Encounter: Payer: Self-pay | Admitting: Physician Assistant

## 2019-03-10 VITALS — BP 116/74 | HR 72 | Ht 70.0 in | Wt 177.4 lb

## 2019-03-10 DIAGNOSIS — I2584 Coronary atherosclerosis due to calcified coronary lesion: Secondary | ICD-10-CM

## 2019-03-10 DIAGNOSIS — G20 Parkinson's disease: Secondary | ICD-10-CM

## 2019-03-10 DIAGNOSIS — I2510 Atherosclerotic heart disease of native coronary artery without angina pectoris: Secondary | ICD-10-CM

## 2019-03-10 NOTE — Patient Instructions (Signed)
Medication Instructions:  Your physician recommends that you continue on your current medications as directed. Please refer to the Current Medication list given to you today.  *If you need a refill on your cardiac medications before your next appointment, please call your pharmacy*  Lab Work: None   If you have labs (blood work) drawn today and your tests are completely normal, you will receive your results only by: Marland Kitchen MyChart Message (if you have MyChart) OR . A paper copy in the mail If you have any lab test that is abnormal or we need to change your treatment, we will call you to review the results.  Testing/Procedures: None   Follow-Up: Follow up with our office as needed   Other Instructions

## 2019-05-02 DIAGNOSIS — M9903 Segmental and somatic dysfunction of lumbar region: Secondary | ICD-10-CM | POA: Diagnosis not present

## 2019-05-02 DIAGNOSIS — M9905 Segmental and somatic dysfunction of pelvic region: Secondary | ICD-10-CM | POA: Diagnosis not present

## 2019-05-02 DIAGNOSIS — M9902 Segmental and somatic dysfunction of thoracic region: Secondary | ICD-10-CM | POA: Diagnosis not present

## 2019-05-02 DIAGNOSIS — M9901 Segmental and somatic dysfunction of cervical region: Secondary | ICD-10-CM | POA: Diagnosis not present

## 2019-05-11 DIAGNOSIS — M9905 Segmental and somatic dysfunction of pelvic region: Secondary | ICD-10-CM | POA: Diagnosis not present

## 2019-05-11 DIAGNOSIS — M9901 Segmental and somatic dysfunction of cervical region: Secondary | ICD-10-CM | POA: Diagnosis not present

## 2019-05-11 DIAGNOSIS — M9902 Segmental and somatic dysfunction of thoracic region: Secondary | ICD-10-CM | POA: Diagnosis not present

## 2019-05-11 DIAGNOSIS — M9903 Segmental and somatic dysfunction of lumbar region: Secondary | ICD-10-CM | POA: Diagnosis not present

## 2019-05-17 DIAGNOSIS — M9903 Segmental and somatic dysfunction of lumbar region: Secondary | ICD-10-CM | POA: Diagnosis not present

## 2019-05-17 DIAGNOSIS — M9905 Segmental and somatic dysfunction of pelvic region: Secondary | ICD-10-CM | POA: Diagnosis not present

## 2019-05-17 DIAGNOSIS — M9902 Segmental and somatic dysfunction of thoracic region: Secondary | ICD-10-CM | POA: Diagnosis not present

## 2019-05-17 DIAGNOSIS — M9901 Segmental and somatic dysfunction of cervical region: Secondary | ICD-10-CM | POA: Diagnosis not present

## 2019-05-24 DIAGNOSIS — M9901 Segmental and somatic dysfunction of cervical region: Secondary | ICD-10-CM | POA: Diagnosis not present

## 2019-05-24 DIAGNOSIS — M9903 Segmental and somatic dysfunction of lumbar region: Secondary | ICD-10-CM | POA: Diagnosis not present

## 2019-05-24 DIAGNOSIS — M9905 Segmental and somatic dysfunction of pelvic region: Secondary | ICD-10-CM | POA: Diagnosis not present

## 2019-05-24 DIAGNOSIS — M9902 Segmental and somatic dysfunction of thoracic region: Secondary | ICD-10-CM | POA: Diagnosis not present

## 2019-06-01 ENCOUNTER — Other Ambulatory Visit: Payer: Self-pay | Admitting: Internal Medicine

## 2019-06-06 ENCOUNTER — Other Ambulatory Visit: Payer: Self-pay | Admitting: Internal Medicine

## 2019-06-06 ENCOUNTER — Encounter: Payer: Self-pay | Admitting: Internal Medicine

## 2019-06-06 ENCOUNTER — Other Ambulatory Visit: Payer: Self-pay

## 2019-06-06 ENCOUNTER — Ambulatory Visit: Payer: Medicare PPO | Admitting: Internal Medicine

## 2019-06-06 VITALS — BP 138/82 | HR 79 | Temp 98.1°F | Ht 70.0 in | Wt 179.2 lb

## 2019-06-06 DIAGNOSIS — R739 Hyperglycemia, unspecified: Secondary | ICD-10-CM | POA: Diagnosis not present

## 2019-06-06 DIAGNOSIS — Z0000 Encounter for general adult medical examination without abnormal findings: Secondary | ICD-10-CM

## 2019-06-06 DIAGNOSIS — E785 Hyperlipidemia, unspecified: Secondary | ICD-10-CM | POA: Diagnosis not present

## 2019-06-06 DIAGNOSIS — N182 Chronic kidney disease, stage 2 (mild): Secondary | ICD-10-CM | POA: Diagnosis not present

## 2019-06-06 DIAGNOSIS — E559 Vitamin D deficiency, unspecified: Secondary | ICD-10-CM | POA: Diagnosis not present

## 2019-06-06 DIAGNOSIS — E538 Deficiency of other specified B group vitamins: Secondary | ICD-10-CM

## 2019-06-06 DIAGNOSIS — E611 Iron deficiency: Secondary | ICD-10-CM

## 2019-06-06 LAB — HEPATIC FUNCTION PANEL
ALT: 11 U/L (ref 0–53)
AST: 17 U/L (ref 0–37)
Albumin: 4.4 g/dL (ref 3.5–5.2)
Alkaline Phosphatase: 89 U/L (ref 39–117)
Bilirubin, Direct: 0.1 mg/dL (ref 0.0–0.3)
Total Bilirubin: 0.5 mg/dL (ref 0.2–1.2)
Total Protein: 6.6 g/dL (ref 6.0–8.3)

## 2019-06-06 LAB — BASIC METABOLIC PANEL
BUN: 40 mg/dL — ABNORMAL HIGH (ref 6–23)
CO2: 25 mEq/L (ref 19–32)
Calcium: 9.5 mg/dL (ref 8.4–10.5)
Chloride: 107 mEq/L (ref 96–112)
Creatinine, Ser: 2.03 mg/dL — ABNORMAL HIGH (ref 0.40–1.50)
GFR: 32.81 mL/min — ABNORMAL LOW (ref >60.00)
Glucose, Bld: 109 mg/dL — ABNORMAL HIGH (ref 70–99)
Potassium: 4.8 mEq/L (ref 3.5–5.1)
Sodium: 139 mEq/L (ref 135–145)

## 2019-06-06 LAB — CBC WITH DIFFERENTIAL/PLATELET
Basophils Absolute: 0.1 10*3/uL (ref 0.0–0.1)
Basophils Relative: 1.1 % (ref 0.0–3.0)
Eosinophils Absolute: 0.1 10*3/uL (ref 0.0–0.7)
Eosinophils Relative: 2.1 % (ref 0.0–5.0)
HCT: 38.8 % — ABNORMAL LOW (ref 39.0–52.0)
Hemoglobin: 13 g/dL (ref 13.0–17.0)
Lymphocytes Relative: 26.9 % (ref 12.0–46.0)
Lymphs Abs: 1.8 10*3/uL (ref 0.7–4.0)
MCHC: 33.6 g/dL (ref 30.0–36.0)
MCV: 92.7 fl (ref 78.0–100.0)
Monocytes Absolute: 0.5 10*3/uL (ref 0.1–1.0)
Monocytes Relative: 7.9 % (ref 3.0–12.0)
Neutro Abs: 4.2 10*3/uL (ref 1.4–7.7)
Neutrophils Relative %: 62 % (ref 43.0–77.0)
Platelets: 205 10*3/uL (ref 150.0–400.0)
RBC: 4.19 Mil/uL — ABNORMAL LOW (ref 4.22–5.81)
RDW: 14.4 % (ref 11.5–15.5)
WBC: 6.7 10*3/uL (ref 4.0–10.5)

## 2019-06-06 LAB — IBC PANEL
Iron: 99 ug/dL (ref 42–165)
Saturation Ratios: 35.7 % (ref 20.0–50.0)
Transferrin: 198 mg/dL — ABNORMAL LOW (ref 212.0–360.0)

## 2019-06-06 LAB — LDL CHOLESTEROL, DIRECT: Direct LDL: 76 mg/dL

## 2019-06-06 LAB — LIPID PANEL
Cholesterol: 150 mg/dL (ref 0–200)
HDL: 52.2 mg/dL (ref >39.00)
NonHDL: 97.9
Total CHOL/HDL Ratio: 3
Triglycerides: 219 mg/dL — ABNORMAL HIGH (ref 0.0–149.0)
VLDL: 43.8 mg/dL — ABNORMAL HIGH (ref 0.0–40.0)

## 2019-06-06 LAB — PSA: PSA: 3.33 ng/mL (ref 0.10–4.00)

## 2019-06-06 LAB — TSH: TSH: 2.59 u[IU]/mL (ref 0.35–4.50)

## 2019-06-06 LAB — VITAMIN D 25 HYDROXY (VIT D DEFICIENCY, FRACTURES): VITD: 24.38 ng/mL — ABNORMAL LOW (ref 30.00–100.00)

## 2019-06-06 LAB — VITAMIN B12: Vitamin B-12: 290 pg/mL (ref 211–911)

## 2019-06-06 LAB — HEMOGLOBIN A1C: Hgb A1c MFr Bld: 5.9 % (ref 4.6–6.5)

## 2019-06-06 MED ORDER — SCOPOLAMINE 1 MG/3DAYS TD PT72: 1.0000 | MEDICATED_PATCH | TRANSDERMAL | 0 refills | Status: DC

## 2019-06-06 MED ORDER — VITAMIN D (ERGOCALCIFEROL) 1.25 MG (50000 UNIT) PO CAPS: 50000.0000 [IU] | ORAL_CAPSULE | ORAL | 0 refills | Status: DC

## 2019-06-06 NOTE — Patient Instructions (Signed)
Your patches for the sea sickness are sent to the pharmacy  Please continue all other medications as before, and refills have been done if requested.  Please have the pharmacy call with any other refills you may need.  Please continue your efforts at being more active, low cholesterol diet, and weight control.  Please keep your appointments with your specialists as you may have planned  Please go to the LAB at the blood drawing area for the tests to be done  You will be contacted by phone if any changes need to be made immediately.  Otherwise, you will receive a letter about your results with an explanation, but please check with MyChart first.  Please remember to sign up for MyChart if you have not done so, as this will be important to you in the future with finding out test results, communicating by private email, and scheduling acute appointments online when needed.

## 2019-06-06 NOTE — Assessment & Plan Note (Signed)
stable overall by history and exam, recent data reviewed with pt, and pt to continue medical treatment as before,  to f/u any worsening symptoms or concerns  

## 2019-06-06 NOTE — Progress Notes (Signed)
Subjective:    Patient ID: John Herring, male    DOB: 01/16/1951, 69 y.o.   MRN: [id number]  HPI  Here with wife, Pt denies chest pain, increased sob or doe, wheezing, orthopnea, PND, increased LE swelling, palpitations, dizziness or syncope.  Pt denies new neurological symptoms such as new headache, or facial or extremity weakness or numbness; To see neurology soon.   Pt denies polydipsia, polyuria, Going on boat trip soon, asks for scopalamine patch Past Medical History:  Diagnosis Date  . Arthritis   . CKD (chronic kidney disease), stage III   . Depression 05/06/2011  . Enlarged prostate   . Former consumption of alcohol   . GERD (gastroesophageal reflux disease) 12/02/2012   patient denies this dx   . History of kidney stones    passed stone  . Hyperlipidemia 05/06/2011  . Hypersomnia 05/06/2011  . Hypotension   . 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 Memory loss 07/12/2012  . 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 Skin cancer 05/06/2011  . Syncope    orthostatic syncope, no problems in tha last 3-4 months   Past Surgical History:  Procedure Laterality Date  . BASAL CELL CARCINOMA EXCISION N/A 03/15/2018   Procedure: RE EXCISION BACK LESION;  Surgeon: Irene Limbo, MD;  Location: [facility];  Service: Plastics;  Laterality: N/A;  . CARDIAC CATHETERIZATION  02/28/2019  . COLONOSCOPY  2013  . deep brain stimulation     Parkinson's disease  . LEFT HEART CATH AND CORONARY ANGIOGRAPHY N/A 02/28/2019   Procedure: LEFT HEART CATH AND CORONARY ANGIOGRAPHY;  Surgeon: Nelva Bush, MD;  Location: [facility];  Service: Cardiovascular;  Laterality: N/A;  . MASS EXCISION N/A 02/12/2018   Procedure: excision lesion back, layered closure 15 cm;  Surgeon: Irene Limbo, MD;  Location: [facility];  Service: Plastics;  Laterality: N/A;  . SKIN SPLIT GRAFT Left 03/15/2018   Procedure: SKIN GRAFT FROM RIGHT OR LEFT THIGH;  Surgeon:  Irene Limbo, MD;  Location: [facility];  Service: Plastics;  Laterality: Left;  . SUBTHALAMIC STIMULATOR BATTERY REPLACEMENT N/A 09/01/2012   Procedure: SUBTHALAMIC STIMULATOR BATTERY REPLACEMENT;  Surgeon: Erline Levine, MD;  Location: [facility];  Service: Neurosurgery;  Laterality: N/A;  Deep Brain Stimulator battery change  . SUBTHALAMIC STIMULATOR BATTERY REPLACEMENT N/A 07/19/2015   Procedure: Deep Brain stimulator battery replacement;  Surgeon: Erline Levine, MD;  Location: [facility];  Service: Neurosurgery;  Laterality: N/A;  Deep Brain stimulator battery replacement  . SUBTHALAMIC STIMULATOR BATTERY REPLACEMENT N/A 01/20/2019   Procedure: Deep brain stimulator battery change;  Surgeon: Erline Levine, MD;  Location: [facility];  Service: Neurosurgery;  Laterality: N/A;  Deep brain stimulator battery change  . TONSILLECTOMY     As a child    reports that he has never smoked. He has never used smokeless tobacco. He reports that he does not drink alcohol or use drugs. family history is not on file. Allergies  Allergen Reactions  . Statins     Makes parkinson worse, blood pressure drop   Current Outpatient Medications on File Prior to Visit  Medication Sig Dispense Refill  . acetaminophen (TYLENOL) 500 MG tablet Take 1,000 mg by mouth every 6 (six) hours as needed for mild pain.    Marland Kitchen amantadine (SYMMETREL) 100 MG capsule Take 100 mg by mouth 2 (two) times daily.     Marland Kitchen aspirin EC 81 MG tablet Take 1 tablet (81 mg total) by  mouth daily. 90 tablet 3  . carbidopa-levodopa (SINEMET IR) 25-100 MG tablet Take 1 tablet by mouth 6 (six) times daily. Taking 1 tablet every 2-3 hours daily    . ezetimibe (ZETIA) 10 MG tablet Take 1 tablet (10 mg total) by mouth daily. Annual appt due in April must see provider for future refills 90 tablet 0  . meloxicam (MOBIC) 15 MG tablet Take 1 tablet (15 mg total) by mouth daily. Annual appt due in April must see provider for future refills 90  tablet 0  . midodrine (PROAMATINE) 5 MG tablet Take 5 mg by mouth 3 (three) times daily with meals.    . mirabegron ER (MYRBETRIQ) 50 MG TB24 tablet Take 50 mg by mouth daily.    . rosuvastatin (CRESTOR) 5 MG tablet Take 1 tablet (5 mg total) by mouth daily. 30 tablet 11   No current facility-administered medications on file prior to visit.   Review of Systems All otherwise neg per pt     Objective:   Physical Exam BP 138/82   Pulse 79   Temp 98.1 F (36.7 C)   Ht 5\' 10"  (1.778 m)   Wt 179 lb 3.2 oz (81.3 kg)   SpO2 100%   BMI 25.71 kg/m  VS noted,  Constitutional: Pt appears in NAD HENT: Head: NCAT.  Right Ear: External ear normal.  Left Ear: External ear normal.  Eyes: . Pupils are equal, round, and reactive to light. Conjunctivae and EOM are normal Nose: without d/c or deformity Neck: Neck supple. Gross normal ROM Cardiovascular: Normal rate and regular rhythm.   Pulmonary/Chest: Effort normal and breath sounds without rales or wheezing.  Abd:  Soft, NT, ND, + BS, no organomegaly Neurological: Pt is alert. At baseline orientation, o/w not don in detail Skin: Skin is warm. No rashes, other new lesions, no LE edema Psychiatric: Pt behavior is normal without agitation  All otherwise neg per pt Lab Results  Component Value Date   WBC 6.7 06/06/2019   HGB 13.0 06/06/2019   HCT 38.8 (L) 06/06/2019   PLT 205.0 06/06/2019   GLUCOSE 109 (H) 06/06/2019   CHOL 150 06/06/2019   TRIG 219.0 (H) 06/06/2019   HDL 52.20 06/06/2019   LDLDIRECT 76.0 06/06/2019   LDLCALC 154 (H) 12/08/2014   ALT 11 06/06/2019   AST 17 06/06/2019   NA 139 06/06/2019   K 4.8 06/06/2019   CL 107 06/06/2019   CREATININE 2.03 (H) 06/06/2019   BUN 40 (H) 06/06/2019   CO2 25 06/06/2019   TSH 2.59 06/06/2019   PSA 3.33 06/06/2019   HGBA1C 5.9 06/06/2019          Assessment & Plan:

## 2019-06-07 LAB — URINALYSIS, ROUTINE W REFLEX MICROSCOPIC
Bilirubin Urine: NEGATIVE
Hgb urine dipstick: NEGATIVE
Ketones, ur: NEGATIVE
Leukocytes,Ua: NEGATIVE
Nitrite: NEGATIVE
RBC / HPF: NONE SEEN (ref 0–?)
Specific Gravity, Urine: 1.025 (ref 1.000–1.030)
Total Protein, Urine: NEGATIVE
Urine Glucose: NEGATIVE
Urobilinogen, UA: 0.2 (ref 0.0–1.0)
WBC, UA: NONE SEEN (ref 0–?)
pH: 6 (ref 5.0–8.0)

## 2019-06-07 NOTE — Addendum Note (Signed)
Addended by: Trenda Moots on: [id number] 12:52 PM   Modules accepted: Orders

## 2019-06-08 DIAGNOSIS — Z9689 Presence of other specified functional implants: Secondary | ICD-10-CM | POA: Diagnosis not present

## 2019-06-08 DIAGNOSIS — Z982 Presence of cerebrospinal fluid drainage device: Secondary | ICD-10-CM | POA: Diagnosis not present

## 2019-06-08 DIAGNOSIS — G20 Parkinson's disease: Secondary | ICD-10-CM | POA: Diagnosis not present

## 2019-06-13 ENCOUNTER — Ambulatory Visit: Payer: Medicare Other

## 2019-06-15 DIAGNOSIS — Z85828 Personal history of other malignant neoplasm of skin: Secondary | ICD-10-CM | POA: Diagnosis not present

## 2019-06-15 DIAGNOSIS — L570 Actinic keratosis: Secondary | ICD-10-CM | POA: Diagnosis not present

## 2019-06-15 DIAGNOSIS — D485 Neoplasm of uncertain behavior of skin: Secondary | ICD-10-CM | POA: Diagnosis not present

## 2019-06-15 DIAGNOSIS — L821 Other seborrheic keratosis: Secondary | ICD-10-CM | POA: Diagnosis not present

## 2019-06-15 DIAGNOSIS — C4441 Basal cell carcinoma of skin of scalp and neck: Secondary | ICD-10-CM | POA: Diagnosis not present

## 2019-06-20 ENCOUNTER — Other Ambulatory Visit: Payer: Self-pay | Admitting: Internal Medicine

## 2019-06-21 ENCOUNTER — Other Ambulatory Visit: Payer: Self-pay | Admitting: Internal Medicine

## 2019-06-21 NOTE — Telephone Encounter (Signed)
Please refill as per office routine med refill policy (all routine meds refilled for 3 mo or monthly per pt preference up to one year from last visit, then month to month grace period for 3 mo, then further med refills will have to be denied)  

## 2019-07-07 ENCOUNTER — Ambulatory Visit: Payer: Medicare Other | Admitting: Internal Medicine

## 2019-08-02 ENCOUNTER — Ambulatory Visit: Payer: Medicare Other | Admitting: Internal Medicine

## 2019-08-04 ENCOUNTER — Other Ambulatory Visit: Payer: Medicare PPO

## 2019-08-04 ENCOUNTER — Other Ambulatory Visit: Payer: Self-pay

## 2019-08-05 DIAGNOSIS — H2513 Age-related nuclear cataract, bilateral: Secondary | ICD-10-CM | POA: Diagnosis not present

## 2019-08-05 DIAGNOSIS — H35372 Puckering of macula, left eye: Secondary | ICD-10-CM | POA: Diagnosis not present

## 2019-08-08 ENCOUNTER — Ambulatory Visit: Payer: Medicare PPO | Admitting: Internal Medicine

## 2019-08-08 ENCOUNTER — Other Ambulatory Visit: Payer: Self-pay

## 2019-08-08 ENCOUNTER — Encounter: Payer: Self-pay | Admitting: Internal Medicine

## 2019-08-08 VITALS — BP 160/110 | HR 72 | Temp 98.7°F | Ht 70.0 in | Wt 170.0 lb

## 2019-08-08 DIAGNOSIS — M545 Low back pain, unspecified: Secondary | ICD-10-CM | POA: Insufficient documentation

## 2019-08-08 DIAGNOSIS — Z23 Encounter for immunization: Secondary | ICD-10-CM

## 2019-08-08 DIAGNOSIS — M542 Cervicalgia: Secondary | ICD-10-CM

## 2019-08-08 DIAGNOSIS — Z0000 Encounter for general adult medical examination without abnormal findings: Secondary | ICD-10-CM | POA: Diagnosis not present

## 2019-08-08 DIAGNOSIS — N401 Enlarged prostate with lower urinary tract symptoms: Secondary | ICD-10-CM | POA: Diagnosis not present

## 2019-08-08 DIAGNOSIS — N183 Chronic kidney disease, stage 3 unspecified: Secondary | ICD-10-CM

## 2019-08-08 DIAGNOSIS — I951 Orthostatic hypotension: Secondary | ICD-10-CM | POA: Diagnosis not present

## 2019-08-08 DIAGNOSIS — Z0001 Encounter for general adult medical examination with abnormal findings: Secondary | ICD-10-CM

## 2019-08-08 DIAGNOSIS — N138 Other obstructive and reflux uropathy: Secondary | ICD-10-CM | POA: Diagnosis not present

## 2019-08-08 DIAGNOSIS — G8929 Other chronic pain: Secondary | ICD-10-CM | POA: Diagnosis not present

## 2019-08-08 LAB — POCT URINALYSIS DIPSTICK
Bilirubin, UA: NEGATIVE
Blood, UA: NEGATIVE
Glucose, UA: NEGATIVE
Ketones, UA: NEGATIVE
Leukocytes, UA: NEGATIVE
Nitrite, UA: NEGATIVE
Protein, UA: POSITIVE — AB
Spec Grav, UA: 1.025 (ref 1.010–1.025)
Urobilinogen, UA: NEGATIVE E.U./dL — AB
pH, UA: 6 (ref 5.0–8.0)

## 2019-08-08 MED ORDER — TRAMADOL HCL ER 100 MG PO TB24: 100.0000 mg | ORAL_TABLET | Freq: Every day | ORAL | 5 refills | Status: DC

## 2019-08-08 NOTE — Assessment & Plan Note (Addendum)
For tramadol er 100 qd, refer ns per pt reqeust  I spent 42 minutes in addition to time for CPX wellness examination in preparing to see the patient by review of recent labs, imaging and procedures, obtaining and reviewing separately obtained history, communicating with the patient and family or caregiver, ordering medications, tests or procedures, and documenting clinical information in the EHR including the differential Dx, treatment, and any further evaluation and other management of neck and lbp, htN, CKD3, urinary frequency

## 2019-08-08 NOTE — Assessment & Plan Note (Signed)

## 2019-08-08 NOTE — Progress Notes (Signed)
Subjective:    Patient ID: John Herring, male    DOB: 13-Apr-1951, 69 y.o.   MRN: [id number]  HPI  Here for wellness and f/u;  Overall doing ok;  Pt denies Chest pain, worsening SOB, DOE, wheezing, orthopnea, PND, worsening LE edema, palpitations, dizziness or syncope.  Pt denies neurological change such as new headache, facial or extremity weakness.  Pt denies polydipsia, polyuria, or low sugar symptoms. Pt states overall good compliance with treatment and medications, good tolerability, and has been trying to follow appropriate diet.  Pt denies worsening depressive symptoms, suicidal ideation or panic. No fever, night sweats, wt loss, loss of appetite, or other constitutional symptoms.  Pt states good ability with ADL's, has low fall risk, home safety reviewed and adequate, no other significant changes in hearing or vision.  DId have fall x 1 recently with bruising below both eyes and abrasion to left leg just below the knee.  Pt continues to have recurring neck and LBP without change in severity, bowel or bladder change, fever, wt loss,  worsening LE pain/numbness/weakness.  BP Readings from Last 3 Encounters:  08/08/19 (!) 160/110  06/06/19 138/82  03/10/19 116/74  also, c/o mild urinary frequency and incontinence, asks to f/u with re-referral to urology.  Due for prevnar and tdap Past Medical History:  Diagnosis Date  . Arthritis   . CKD (chronic kidney disease), stage III   . Depression 05/06/2011  . Enlarged prostate   . Former consumption of alcohol   . GERD (gastroesophageal reflux disease) 12/02/2012   patient denies this dx   . History of kidney stones    passed stone  . Hyperlipidemia 05/06/2011  . Hypersomnia 05/06/2011  . Hypotension   . 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 Memory loss 07/12/2012  . 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 Skin cancer 05/06/2011  . Syncope    orthostatic syncope, no problems in tha last 3-4 months   Past Surgical History:  Procedure  Laterality Date  . BASAL CELL CARCINOMA EXCISION N/A 03/15/2018   Procedure: RE EXCISION BACK LESION;  Surgeon: Irene Limbo, MD;  Location: [facility];  Service: Plastics;  Laterality: N/A;  . CARDIAC CATHETERIZATION  02/28/2019  . COLONOSCOPY  2013  . deep brain stimulation     Parkinson's disease  . LEFT HEART CATH AND CORONARY ANGIOGRAPHY N/A 02/28/2019   Procedure: LEFT HEART CATH AND CORONARY ANGIOGRAPHY;  Surgeon: Nelva Bush, MD;  Location: [facility];  Service: Cardiovascular;  Laterality: N/A;  . MASS EXCISION N/A 02/12/2018   Procedure: excision lesion back, layered closure 15 cm;  Surgeon: Irene Limbo, MD;  Location: [facility];  Service: Plastics;  Laterality: N/A;  . SKIN SPLIT GRAFT Left 03/15/2018   Procedure: SKIN GRAFT FROM RIGHT OR LEFT THIGH;  Surgeon: Irene Limbo, MD;  Location: [facility];  Service: Plastics;  Laterality: Left;  . SUBTHALAMIC STIMULATOR BATTERY REPLACEMENT N/A 09/01/2012   Procedure: SUBTHALAMIC STIMULATOR BATTERY REPLACEMENT;  Surgeon: Erline Levine, MD;  Location: [facility];  Service: Neurosurgery;  Laterality: N/A;  Deep Brain Stimulator battery change  . SUBTHALAMIC STIMULATOR BATTERY REPLACEMENT N/A 07/19/2015   Procedure: Deep Brain stimulator battery replacement;  Surgeon: Erline Levine, MD;  Location: [facility];  Service: Neurosurgery;  Laterality: N/A;  Deep Brain stimulator battery replacement  . SUBTHALAMIC STIMULATOR BATTERY REPLACEMENT N/A 01/20/2019   Procedure: Deep brain stimulator battery change;  Surgeon: Erline Levine, MD;  Location: [facility];  Service: Neurosurgery;  Laterality: N/A;  Deep brain stimulator battery change  . TONSILLECTOMY     As a child    reports that he has never smoked. He has never used smokeless tobacco. He reports that he does not drink alcohol or use drugs. family history is not on file. Allergies  Allergen Reactions  . Statins     Makes  parkinson worse, blood pressure drop   Current Outpatient Medications on File Prior to Visit  Medication Sig Dispense Refill  . acetaminophen (TYLENOL) 500 MG tablet Take 1,000 mg by mouth every 6 (six) hours as needed for mild pain.    Marland Kitchen amantadine (SYMMETREL) 100 MG capsule Take 100 mg by mouth 2 (two) times daily.     Marland Kitchen aspirin EC 81 MG tablet Take 1 tablet (81 mg total) by mouth daily. 90 tablet 3  . carbidopa-levodopa (SINEMET IR) 25-100 MG tablet Take 1 tablet by mouth 6 (six) times daily. Taking 1 tablet every 2-3 hours daily    . ezetimibe (ZETIA) 10 MG tablet Take 1 tablet (10 mg total) by mouth daily. 90 tablet 3  . midodrine (PROAMATINE) 5 MG tablet Take 5 mg by mouth 3 (three) times daily with meals.    . mirabegron ER (MYRBETRIQ) 50 MG TB24 tablet Take 50 mg by mouth daily.    . rosuvastatin (CRESTOR) 5 MG tablet Take 1 tablet (5 mg total) by mouth daily. 30 tablet 11  . scopolamine (TRANSDERM-SCOP, 1.5 MG,) 1 MG/3DAYS Place 1 patch (1.5 mg total) onto the skin every 3 (three) days. 10 patch 0  . Vitamin D, Ergocalciferol, (DRISDOL) 1.25 MG (50000 UNIT) CAPS capsule Take 1 capsule (50,000 Units total) by mouth every 7 (seven) days. 12 capsule 0   No current facility-administered medications on file prior to visit.   Review of Systems All otherwise neg per pt    Objective:   Physical Exam BP (!) 160/110 (BP Location: Right Arm, Patient Position: Sitting, Cuff Size: Large)   Pulse 72   Temp 98.7 F (37.1 C) (Oral)   Ht 5\' 10"  (1.778 m)   Wt 170 lb (77.1 kg)   SpO2 99%   BMI 24.39 kg/m  VS noted,  Constitutional: Pt appears in NAD HENT: Head: NCAT.  Right Ear: External ear normal.  Left Ear: External ear normal.  Eyes: . Pupils are equal, round, and reactive to light. Conjunctivae and EOM are normal Nose: without d/c or deformity Neck: Neck supple. Gross normal ROM Cardiovascular: Normal rate and regular rhythm.   Pulmonary/Chest: Effort normal and breath sounds  without rales or wheezing.  Abd:  Soft, NT, ND, + BS, no organomegaly Neurological: Pt is alert. At baseline orientation, motor grossly intact Skin: Skin is warm. No rashes, other new lesions, no LE edema Psychiatric: Pt behavior is normal without agitation  All otherwise neg per pt  Lab Results  Component Value Date   WBC 6.7 06/06/2019   HGB 13.0 06/06/2019   HCT 38.8 (L) 06/06/2019   PLT 205.0 06/06/2019   GLUCOSE 109 (H) 06/06/2019   CHOL 150 06/06/2019   TRIG 219.0 (H) 06/06/2019   HDL 52.20 06/06/2019   LDLDIRECT 76.0 06/06/2019   LDLCALC 154 (H) 12/08/2014   ALT 11 06/06/2019   AST 17 06/06/2019   NA 139 06/06/2019   K 4.8 06/06/2019   CL 107 06/06/2019   CREATININE 2.03 (H) 06/06/2019   BUN 40 (H) 06/06/2019   CO2 25 06/06/2019   TSH 2.59 06/06/2019  PSA 3.33 06/06/2019   HGBA1C 5.9 06/06/2019      Assessment & Plan:

## 2019-08-08 NOTE — Patient Instructions (Signed)
You had the Prevnar 13 and Tdap Tetanus shots today  Ok to stop the meloxicam  Please take all new medication as prescribed - the tramadol ER 100 mg per day  Please check BP t home on a regular basis as we discussed  Please remain active with walking in the home if you can to avoid further falling in the future  Please continue all other medications as before, and refills have been done if requested.  Please have the pharmacy call with any other refills you may need.  Please continue your efforts at being more active, low cholesterol diet, and weight control.  You are otherwise up to date with prevention measures today.  Please keep your appointments with your specialists as you may have planned  You will be contacted regarding the referral for: Dr Vertell Limber, Dr Karsten Ro, and Nephrology (kidney doctors)  Please go to the LAB at the blood drawing area for the tests to be done - just the urine testing today  You will be contacted by phone if any changes need to be made immediately.  Otherwise, you will receive a letter about your results with an explanation, but please check with MyChart first.  Please remember to sign up for MyChart if you have not done so, as this will be important to you in the future with finding out test results, communicating by private email, and scheduling acute appointments online when needed.  Please make an Appointment to return in 6 months, or sooner if needed

## 2019-08-08 NOTE — Assessment & Plan Note (Addendum)
For renal referra, d/c mobic,,  to f/u any worsening symptoms or concerns

## 2019-08-08 NOTE — Assessment & Plan Note (Signed)
For tramadol er 100 qd, refer ns per pt reqeust

## 2019-08-08 NOTE — Assessment & Plan Note (Signed)
For urology referral,  to f/u any worsening symptoms or concerns

## 2019-08-08 NOTE — Assessment & Plan Note (Addendum)
For f/u bp at home, stable overall by history and exam, recent data reviewed with pt, and pt to continue medical treatment as before,  to f/u any worsening symptoms or concerns

## 2019-08-09 NOTE — Addendum Note (Signed)
Addended by: Aviva Signs M on: 08/09/2019 10:05 AM   Modules accepted: Orders

## 2019-08-16 ENCOUNTER — Encounter: Payer: Self-pay | Admitting: Internal Medicine

## 2019-08-23 ENCOUNTER — Other Ambulatory Visit: Payer: Self-pay | Admitting: Internal Medicine

## 2019-08-23 NOTE — Telephone Encounter (Signed)
Please change to OTC Vitamin D3 at 2000 units per day, indefinitely.  

## 2019-08-24 DIAGNOSIS — R351 Nocturia: Secondary | ICD-10-CM | POA: Diagnosis not present

## 2019-08-26 DIAGNOSIS — R338 Other retention of urine: Secondary | ICD-10-CM | POA: Diagnosis not present

## 2019-08-30 ENCOUNTER — Encounter: Payer: Self-pay | Admitting: Internal Medicine

## 2019-08-30 DIAGNOSIS — R338 Other retention of urine: Secondary | ICD-10-CM | POA: Diagnosis not present

## 2019-09-07 DIAGNOSIS — R338 Other retention of urine: Secondary | ICD-10-CM | POA: Diagnosis not present

## 2019-09-12 DIAGNOSIS — M546 Pain in thoracic spine: Secondary | ICD-10-CM | POA: Diagnosis not present

## 2019-09-12 DIAGNOSIS — G20 Parkinson's disease: Secondary | ICD-10-CM | POA: Diagnosis not present

## 2019-09-12 DIAGNOSIS — M542 Cervicalgia: Secondary | ICD-10-CM | POA: Diagnosis not present

## 2019-09-12 DIAGNOSIS — M4004 Postural kyphosis, thoracic region: Secondary | ICD-10-CM | POA: Diagnosis not present

## 2019-09-13 ENCOUNTER — Other Ambulatory Visit: Payer: Self-pay | Admitting: Internal Medicine

## 2019-09-26 ENCOUNTER — Encounter: Payer: Self-pay | Admitting: Internal Medicine

## 2019-09-30 DIAGNOSIS — G20 Parkinson's disease: Secondary | ICD-10-CM | POA: Diagnosis not present

## 2019-09-30 DIAGNOSIS — M6281 Muscle weakness (generalized): Secondary | ICD-10-CM | POA: Diagnosis not present

## 2019-09-30 DIAGNOSIS — M542 Cervicalgia: Secondary | ICD-10-CM | POA: Diagnosis not present

## 2019-10-10 DIAGNOSIS — R338 Other retention of urine: Secondary | ICD-10-CM | POA: Diagnosis not present

## 2019-10-10 DIAGNOSIS — N401 Enlarged prostate with lower urinary tract symptoms: Secondary | ICD-10-CM | POA: Diagnosis not present

## 2019-10-10 DIAGNOSIS — R351 Nocturia: Secondary | ICD-10-CM | POA: Diagnosis not present

## 2019-10-10 DIAGNOSIS — R350 Frequency of micturition: Secondary | ICD-10-CM | POA: Diagnosis not present

## 2019-10-11 DIAGNOSIS — M6281 Muscle weakness (generalized): Secondary | ICD-10-CM | POA: Diagnosis not present

## 2019-10-11 DIAGNOSIS — M542 Cervicalgia: Secondary | ICD-10-CM | POA: Diagnosis not present

## 2019-10-11 DIAGNOSIS — G20 Parkinson's disease: Secondary | ICD-10-CM | POA: Diagnosis not present

## 2019-10-13 DIAGNOSIS — M6281 Muscle weakness (generalized): Secondary | ICD-10-CM | POA: Diagnosis not present

## 2019-10-13 DIAGNOSIS — M542 Cervicalgia: Secondary | ICD-10-CM | POA: Diagnosis not present

## 2019-10-13 DIAGNOSIS — G20 Parkinson's disease: Secondary | ICD-10-CM | POA: Diagnosis not present

## 2019-11-09 ENCOUNTER — Other Ambulatory Visit: Payer: Self-pay | Admitting: Cardiology

## 2019-11-09 DIAGNOSIS — F329 Major depressive disorder, single episode, unspecified: Secondary | ICD-10-CM | POA: Diagnosis not present

## 2019-11-09 DIAGNOSIS — G903 Multi-system degeneration of the autonomic nervous system: Secondary | ICD-10-CM | POA: Diagnosis not present

## 2019-11-09 DIAGNOSIS — I129 Hypertensive chronic kidney disease with stage 1 through stage 4 chronic kidney disease, or unspecified chronic kidney disease: Secondary | ICD-10-CM | POA: Diagnosis not present

## 2019-11-09 DIAGNOSIS — N1832 Chronic kidney disease, stage 3b: Secondary | ICD-10-CM | POA: Diagnosis not present

## 2019-11-09 DIAGNOSIS — G20 Parkinson's disease: Secondary | ICD-10-CM | POA: Diagnosis not present

## 2019-11-09 DIAGNOSIS — E785 Hyperlipidemia, unspecified: Secondary | ICD-10-CM | POA: Diagnosis not present

## 2019-11-09 DIAGNOSIS — K219 Gastro-esophageal reflux disease without esophagitis: Secondary | ICD-10-CM | POA: Diagnosis not present

## 2019-11-09 DIAGNOSIS — N189 Chronic kidney disease, unspecified: Secondary | ICD-10-CM | POA: Diagnosis not present

## 2019-11-11 ENCOUNTER — Other Ambulatory Visit: Payer: Self-pay | Admitting: Internal Medicine

## 2019-11-11 DIAGNOSIS — N1832 Chronic kidney disease, stage 3b: Secondary | ICD-10-CM

## 2019-11-11 DIAGNOSIS — I129 Hypertensive chronic kidney disease with stage 1 through stage 4 chronic kidney disease, or unspecified chronic kidney disease: Secondary | ICD-10-CM

## 2019-11-14 DIAGNOSIS — M542 Cervicalgia: Secondary | ICD-10-CM | POA: Diagnosis not present

## 2019-11-14 DIAGNOSIS — G20 Parkinson's disease: Secondary | ICD-10-CM | POA: Diagnosis not present

## 2019-11-14 DIAGNOSIS — M6281 Muscle weakness (generalized): Secondary | ICD-10-CM | POA: Diagnosis not present

## 2019-11-17 ENCOUNTER — Ambulatory Visit
Admission: RE | Admit: 2019-11-17 | Discharge: 2019-11-17 | Disposition: A | Discharge status: Home / Self Care | Payer: Medicare PPO | Source: Ambulatory Visit | Attending: Internal Medicine | Admitting: Internal Medicine

## 2019-11-17 DIAGNOSIS — N189 Chronic kidney disease, unspecified: Secondary | ICD-10-CM | POA: Diagnosis not present

## 2019-11-17 DIAGNOSIS — I129 Hypertensive chronic kidney disease with stage 1 through stage 4 chronic kidney disease, or unspecified chronic kidney disease: Secondary | ICD-10-CM

## 2019-11-17 DIAGNOSIS — M542 Cervicalgia: Secondary | ICD-10-CM | POA: Diagnosis not present

## 2019-11-17 DIAGNOSIS — G20 Parkinson's disease: Secondary | ICD-10-CM | POA: Diagnosis not present

## 2019-11-17 DIAGNOSIS — M6281 Muscle weakness (generalized): Secondary | ICD-10-CM | POA: Diagnosis not present

## 2019-11-17 DIAGNOSIS — N200 Calculus of kidney: Secondary | ICD-10-CM | POA: Diagnosis not present

## 2019-11-17 DIAGNOSIS — N1832 Chronic kidney disease, stage 3b: Secondary | ICD-10-CM

## 2019-11-17 DIAGNOSIS — N261 Atrophy of kidney (terminal): Secondary | ICD-10-CM | POA: Diagnosis not present

## 2019-11-17 DIAGNOSIS — N400 Enlarged prostate without lower urinary tract symptoms: Secondary | ICD-10-CM | POA: Diagnosis not present

## 2019-11-21 DIAGNOSIS — M542 Cervicalgia: Secondary | ICD-10-CM | POA: Diagnosis not present

## 2019-11-21 DIAGNOSIS — M4004 Postural kyphosis, thoracic region: Secondary | ICD-10-CM | POA: Diagnosis not present

## 2019-11-21 DIAGNOSIS — M546 Pain in thoracic spine: Secondary | ICD-10-CM | POA: Diagnosis not present

## 2019-11-21 DIAGNOSIS — G20 Parkinson's disease: Secondary | ICD-10-CM | POA: Diagnosis not present

## 2019-11-23 DIAGNOSIS — R350 Frequency of micturition: Secondary | ICD-10-CM | POA: Diagnosis not present

## 2019-11-23 DIAGNOSIS — R3915 Urgency of urination: Secondary | ICD-10-CM | POA: Diagnosis not present

## 2019-11-23 DIAGNOSIS — R351 Nocturia: Secondary | ICD-10-CM | POA: Diagnosis not present

## 2019-11-23 DIAGNOSIS — M6281 Muscle weakness (generalized): Secondary | ICD-10-CM | POA: Diagnosis not present

## 2019-11-23 DIAGNOSIS — G20 Parkinson's disease: Secondary | ICD-10-CM | POA: Diagnosis not present

## 2019-11-23 DIAGNOSIS — M542 Cervicalgia: Secondary | ICD-10-CM | POA: Diagnosis not present

## 2019-11-25 DIAGNOSIS — M542 Cervicalgia: Secondary | ICD-10-CM | POA: Diagnosis not present

## 2019-11-25 DIAGNOSIS — G20 Parkinson's disease: Secondary | ICD-10-CM | POA: Diagnosis not present

## 2019-11-25 DIAGNOSIS — M6281 Muscle weakness (generalized): Secondary | ICD-10-CM | POA: Diagnosis not present

## 2019-11-28 DIAGNOSIS — M542 Cervicalgia: Secondary | ICD-10-CM | POA: Diagnosis not present

## 2019-11-28 DIAGNOSIS — N1832 Chronic kidney disease, stage 3b: Secondary | ICD-10-CM | POA: Diagnosis not present

## 2019-11-28 DIAGNOSIS — G20 Parkinson's disease: Secondary | ICD-10-CM | POA: Diagnosis not present

## 2019-11-28 DIAGNOSIS — M6281 Muscle weakness (generalized): Secondary | ICD-10-CM | POA: Diagnosis not present

## 2019-12-21 DIAGNOSIS — M542 Cervicalgia: Secondary | ICD-10-CM | POA: Diagnosis not present

## 2019-12-21 DIAGNOSIS — M47812 Spondylosis without myelopathy or radiculopathy, cervical region: Secondary | ICD-10-CM | POA: Diagnosis not present

## 2019-12-21 DIAGNOSIS — M47816 Spondylosis without myelopathy or radiculopathy, lumbar region: Secondary | ICD-10-CM | POA: Diagnosis not present

## 2019-12-21 DIAGNOSIS — M546 Pain in thoracic spine: Secondary | ICD-10-CM | POA: Diagnosis not present

## 2019-12-28 DIAGNOSIS — Z888 Allergy status to other drugs, medicaments and biological substances status: Secondary | ICD-10-CM | POA: Diagnosis not present

## 2019-12-28 DIAGNOSIS — R42 Dizziness and giddiness: Secondary | ICD-10-CM | POA: Diagnosis not present

## 2019-12-28 DIAGNOSIS — G20 Parkinson's disease: Secondary | ICD-10-CM | POA: Diagnosis not present

## 2019-12-28 DIAGNOSIS — Z9682 Presence of neurostimulator: Secondary | ICD-10-CM | POA: Diagnosis not present

## 2019-12-28 DIAGNOSIS — Z79899 Other long term (current) drug therapy: Secondary | ICD-10-CM | POA: Diagnosis not present

## 2019-12-28 DIAGNOSIS — R4189 Other symptoms and signs involving cognitive functions and awareness: Secondary | ICD-10-CM | POA: Diagnosis not present

## 2019-12-28 DIAGNOSIS — I951 Orthostatic hypotension: Secondary | ICD-10-CM | POA: Diagnosis not present

## 2019-12-29 DIAGNOSIS — Z85828 Personal history of other malignant neoplasm of skin: Secondary | ICD-10-CM | POA: Diagnosis not present

## 2019-12-29 DIAGNOSIS — L821 Other seborrheic keratosis: Secondary | ICD-10-CM | POA: Diagnosis not present

## 2019-12-29 DIAGNOSIS — L988 Other specified disorders of the skin and subcutaneous tissue: Secondary | ICD-10-CM | POA: Diagnosis not present

## 2019-12-29 DIAGNOSIS — G20 Parkinson's disease: Secondary | ICD-10-CM | POA: Diagnosis not present

## 2019-12-29 DIAGNOSIS — D485 Neoplasm of uncertain behavior of skin: Secondary | ICD-10-CM | POA: Diagnosis not present

## 2019-12-29 DIAGNOSIS — L570 Actinic keratosis: Secondary | ICD-10-CM | POA: Diagnosis not present

## 2019-12-29 DIAGNOSIS — M6281 Muscle weakness (generalized): Secondary | ICD-10-CM | POA: Diagnosis not present

## 2019-12-29 DIAGNOSIS — M542 Cervicalgia: Secondary | ICD-10-CM | POA: Diagnosis not present

## 2019-12-29 DIAGNOSIS — B078 Other viral warts: Secondary | ICD-10-CM | POA: Diagnosis not present

## 2020-01-03 DIAGNOSIS — M542 Cervicalgia: Secondary | ICD-10-CM | POA: Diagnosis not present

## 2020-01-03 DIAGNOSIS — G20 Parkinson's disease: Secondary | ICD-10-CM | POA: Diagnosis not present

## 2020-01-03 DIAGNOSIS — M6281 Muscle weakness (generalized): Secondary | ICD-10-CM | POA: Diagnosis not present

## 2020-01-05 DIAGNOSIS — M542 Cervicalgia: Secondary | ICD-10-CM | POA: Diagnosis not present

## 2020-01-05 DIAGNOSIS — G20 Parkinson's disease: Secondary | ICD-10-CM | POA: Diagnosis not present

## 2020-01-05 DIAGNOSIS — M6281 Muscle weakness (generalized): Secondary | ICD-10-CM | POA: Diagnosis not present

## 2020-01-10 DIAGNOSIS — M542 Cervicalgia: Secondary | ICD-10-CM | POA: Diagnosis not present

## 2020-01-10 DIAGNOSIS — M6281 Muscle weakness (generalized): Secondary | ICD-10-CM | POA: Diagnosis not present

## 2020-01-10 DIAGNOSIS — G20 Parkinson's disease: Secondary | ICD-10-CM | POA: Diagnosis not present

## 2020-01-11 ENCOUNTER — Encounter: Payer: Self-pay | Admitting: Internal Medicine

## 2020-01-12 DIAGNOSIS — G20 Parkinson's disease: Secondary | ICD-10-CM | POA: Diagnosis not present

## 2020-01-12 DIAGNOSIS — M6281 Muscle weakness (generalized): Secondary | ICD-10-CM | POA: Diagnosis not present

## 2020-01-12 DIAGNOSIS — M542 Cervicalgia: Secondary | ICD-10-CM | POA: Diagnosis not present

## 2020-01-16 DIAGNOSIS — G20 Parkinson's disease: Secondary | ICD-10-CM | POA: Diagnosis not present

## 2020-01-16 DIAGNOSIS — M542 Cervicalgia: Secondary | ICD-10-CM | POA: Diagnosis not present

## 2020-01-16 DIAGNOSIS — M6281 Muscle weakness (generalized): Secondary | ICD-10-CM | POA: Diagnosis not present

## 2020-01-23 DIAGNOSIS — M47816 Spondylosis without myelopathy or radiculopathy, lumbar region: Secondary | ICD-10-CM | POA: Diagnosis not present

## 2020-01-23 DIAGNOSIS — M47814 Spondylosis without myelopathy or radiculopathy, thoracic region: Secondary | ICD-10-CM | POA: Diagnosis not present

## 2020-01-23 DIAGNOSIS — M47812 Spondylosis without myelopathy or radiculopathy, cervical region: Secondary | ICD-10-CM | POA: Diagnosis not present

## 2020-01-23 DIAGNOSIS — M546 Pain in thoracic spine: Secondary | ICD-10-CM | POA: Diagnosis not present

## 2020-01-24 DIAGNOSIS — M47816 Spondylosis without myelopathy or radiculopathy, lumbar region: Secondary | ICD-10-CM | POA: Diagnosis not present

## 2020-02-06 DIAGNOSIS — R030 Elevated blood-pressure reading, without diagnosis of hypertension: Secondary | ICD-10-CM | POA: Diagnosis not present

## 2020-02-06 DIAGNOSIS — G20 Parkinson's disease: Secondary | ICD-10-CM | POA: Diagnosis not present

## 2020-02-06 DIAGNOSIS — G249 Dystonia, unspecified: Secondary | ICD-10-CM | POA: Diagnosis not present

## 2020-02-06 DIAGNOSIS — I951 Orthostatic hypotension: Secondary | ICD-10-CM | POA: Diagnosis not present

## 2020-02-06 DIAGNOSIS — Z79899 Other long term (current) drug therapy: Secondary | ICD-10-CM | POA: Diagnosis not present

## 2020-02-07 ENCOUNTER — Ambulatory Visit: Payer: Medicare PPO | Admitting: Internal Medicine

## 2020-02-07 ENCOUNTER — Encounter: Payer: Self-pay | Admitting: Internal Medicine

## 2020-02-07 ENCOUNTER — Other Ambulatory Visit: Payer: Self-pay

## 2020-02-07 VITALS — BP 118/60 | HR 69 | Temp 98.7°F | Ht 70.0 in | Wt 166.0 lb

## 2020-02-07 DIAGNOSIS — N183 Chronic kidney disease, stage 3 unspecified: Secondary | ICD-10-CM | POA: Diagnosis not present

## 2020-02-07 DIAGNOSIS — E785 Hyperlipidemia, unspecified: Secondary | ICD-10-CM | POA: Diagnosis not present

## 2020-02-07 DIAGNOSIS — M545 Low back pain, unspecified: Secondary | ICD-10-CM

## 2020-02-07 DIAGNOSIS — G8929 Other chronic pain: Secondary | ICD-10-CM | POA: Diagnosis not present

## 2020-02-07 DIAGNOSIS — I951 Orthostatic hypotension: Secondary | ICD-10-CM | POA: Diagnosis not present

## 2020-02-07 DIAGNOSIS — E559 Vitamin D deficiency, unspecified: Secondary | ICD-10-CM | POA: Diagnosis not present

## 2020-02-07 DIAGNOSIS — Z23 Encounter for immunization: Secondary | ICD-10-CM | POA: Diagnosis not present

## 2020-02-07 DIAGNOSIS — R739 Hyperglycemia, unspecified: Secondary | ICD-10-CM

## 2020-02-07 NOTE — Patient Instructions (Signed)
Please continue all other medications as before, and refills have been done if requested.  Please have the pharmacy call with any other refills you may need.  Please continue your efforts at being more active, low cholesterol diet, and weight control.  Please keep your appointments with your specialists as you may have planned  Please make an Appointment to return in 6 months, or sooner if needed 

## 2020-02-07 NOTE — Progress Notes (Signed)
Subjective:    Patient ID: John Herring, male    DOB: 08/09/1950, 69 y.o.   MRN: [id number]  HPI  Here to f/u; overall doing ok,  Pt denies chest pain, increasing sob or doe, wheezing, orthopnea, PND, increased LE swelling, palpitations,  Pt denies new neurological symptoms such as new headache, or facial or extremity weakness or numbness.  Pt denies polydipsia, polyuria, or low sugar episode.  Pt states overall good compliance with meds, mostly trying to follow appropriate diet, with wt overall down several lbs/ Wt Readings from Last 3 Encounters:  02/07/20 166 lb (75.3 kg)  08/08/19 170 lb (77.1 kg)  06/06/19 179 lb 3.2 oz (81.3 kg)  Now on a kind of sliding scale for BP with the midodrine per Dr Levy/BP specialist.   Also seen per renal, felt to be stable.  Has f/u with urology next wk, then neurology soon.  Has not fallen now in over 3 months  Pt continues to have recurring LBP without change in severity, bowel or bladder change, fever, wt loss,  worsening LE pain/numbness/weakness, gait change or falls.  Tolerating vit d Past Medical History:  Diagnosis Date  . Arthritis   . CKD (chronic kidney disease), stage III ([place])   . Depression 05/06/2011  . Enlarged prostate   . Former consumption of alcohol   . GERD (gastroesophageal reflux disease) 12/02/2012   patient denies this dx   . History of kidney stones    passed stone  . Hyperlipidemia 05/06/2011  . Hypersomnia 05/06/2011  . Hypotension   . 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 Memory loss 07/12/2012  . 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 Skin cancer 05/06/2011  . Syncope    orthostatic syncope, no problems in tha last 3-4 months   Past Surgical History:  Procedure Laterality Date  . BASAL CELL CARCINOMA EXCISION N/A 03/15/2018   Procedure: RE EXCISION BACK LESION;  Surgeon: Irene Limbo, MD;  Location: [facility];  Service: Plastics;  Laterality: N/A;  . CARDIAC CATHETERIZATION  02/28/2019  .  COLONOSCOPY  2013  . deep brain stimulation     Parkinson's disease  . LEFT HEART CATH AND CORONARY ANGIOGRAPHY N/A 02/28/2019   Procedure: LEFT HEART CATH AND CORONARY ANGIOGRAPHY;  Surgeon: Nelva Bush, MD;  Location: [facility];  Service: Cardiovascular;  Laterality: N/A;  . MASS EXCISION N/A 02/12/2018   Procedure: excision lesion back, layered closure 15 cm;  Surgeon: Irene Limbo, MD;  Location: [facility];  Service: Plastics;  Laterality: N/A;  . SKIN SPLIT GRAFT Left 03/15/2018   Procedure: SKIN GRAFT FROM RIGHT OR LEFT THIGH;  Surgeon: Irene Limbo, MD;  Location: [facility];  Service: Plastics;  Laterality: Left;  . SUBTHALAMIC STIMULATOR BATTERY REPLACEMENT N/A 09/01/2012   Procedure: SUBTHALAMIC STIMULATOR BATTERY REPLACEMENT;  Surgeon: Erline Levine, MD;  Location: [facility];  Service: Neurosurgery;  Laterality: N/A;  Deep Brain Stimulator battery change  . SUBTHALAMIC STIMULATOR BATTERY REPLACEMENT N/A 07/19/2015   Procedure: Deep Brain stimulator battery replacement;  Surgeon: Erline Levine, MD;  Location: [facility];  Service: Neurosurgery;  Laterality: N/A;  Deep Brain stimulator battery replacement  . SUBTHALAMIC STIMULATOR BATTERY REPLACEMENT N/A 01/20/2019   Procedure: Deep brain stimulator battery change;  Surgeon: Erline Levine, MD;  Location: [facility];  Service: Neurosurgery;  Laterality: N/A;  Deep brain stimulator battery change  . TONSILLECTOMY     As a child    reports that he  has never smoked. He has never used smokeless tobacco. He reports that he does not drink alcohol and does not use drugs. family history is not on file. Allergies  Allergen Reactions  . Statins     Makes parkinson worse, blood pressure drop   Current Outpatient Medications on File Prior to Visit  Medication Sig Dispense Refill  . Acetaminophen-Codeine 300-30 MG tablet     . amantadine (SYMMETREL) 100 MG capsule Take 100 mg by mouth 2 (two)  times daily.     Marland Kitchen aspirin EC 81 MG tablet Take 1 tablet (81 mg total) by mouth daily. 90 tablet 3  . carbidopa-levodopa (SINEMET IR) 25-100 MG tablet Take 1 tablet by mouth 6 (six) times daily. Taking 1 tablet every 2-3 hours daily    . dextromethorphan (DELSYM) 30 MG/5ML liquid Take by mouth.    . diclofenac (VOLTAREN) 50 MG EC tablet     . donepezil (ARICEPT) 5 MG tablet     . ezetimibe (ZETIA) 10 MG tablet Take 1 tablet (10 mg total) by mouth daily. 90 tablet 3  . midodrine (PROAMATINE) 5 MG tablet TAKE 1 TABLET (5 MG TOTAL) BY MOUTH 3 (THREE) TIMES DAILY WITH MEALS. 270 tablet 3  . rosuvastatin (CRESTOR) 5 MG tablet TAKE 1 TABLET BY MOUTH EVERY DAY 90 tablet 0  . sertraline (ZOLOFT) 50 MG tablet     . TOVIAZ 8 MG TB24 tablet      No current facility-administered medications on file prior to visit.   Review of Systems All otherwise neg per pt     Objective:   Physical Exam BP 118/60 (BP Location: Left Arm, Patient Position: Sitting, Cuff Size: Large)   Pulse 69   Temp 98.7 F (37.1 C) (Oral)   Ht 5\' 10"  (1.778 m)   Wt 166 lb (75.3 kg)   SpO2 97%   BMI 23.82 kg/m  VS noted,  Constitutional: Pt appears in NAD HENT: Head: NCAT.  Right Ear: External ear normal.  Left Ear: External ear normal.  Eyes: . Pupils are equal, round, and reactive to light. Conjunctivae and EOM are normal Nose: without d/c or deformity Neck: Neck supple. Gross normal ROM Cardiovascular: Normal rate and regular rhythm.   Pulmonary/Chest: Effort normal and breath sounds without rales or wheezing.  Abd:  Soft, NT, ND, + BS, no organomegaly Neurological: Pt is alert. At baseline orientation, motor grossly intact Skin: Skin is warm. No rashes, other new lesions, no LE edema Psychiatric: Pt behavior is normal without agitation  All otherwise neg per pt Lab Results  Component Value Date   WBC 6.7 06/06/2019   HGB 13.0 06/06/2019   HCT 38.8 (L) 06/06/2019   PLT 205.0 06/06/2019   GLUCOSE 109 (H)  06/06/2019   CHOL 150 06/06/2019   TRIG 219.0 (H) 06/06/2019   HDL 52.20 06/06/2019   LDLDIRECT 76.0 06/06/2019   LDLCALC 154 (H) 12/08/2014   ALT 11 06/06/2019   AST 17 06/06/2019   NA 139 06/06/2019   K 4.8 06/06/2019   CL 107 06/06/2019   CREATININE 2.03 (H) 06/06/2019   BUN 40 (H) 06/06/2019   CO2 25 06/06/2019   TSH 2.59 06/06/2019   PSA 3.33 06/06/2019   HGBA1C 5.9 06/06/2019          Assessment & Plan:

## 2020-02-12 ENCOUNTER — Encounter: Payer: Self-pay | Admitting: Internal Medicine

## 2020-02-12 DIAGNOSIS — E559 Vitamin D deficiency, unspecified: Secondary | ICD-10-CM | POA: Insufficient documentation

## 2020-02-12 DIAGNOSIS — G8929 Other chronic pain: Secondary | ICD-10-CM | POA: Insufficient documentation

## 2020-02-12 NOTE — Assessment & Plan Note (Addendum)
Much improved on midodrine sliding scale,  to f/u any worsening symptoms or concerns  I spent 41 minutes in preparing to see the patient by review of recent labs, imaging and procedures, obtaining and reviewing separately obtained history, communicating with the patient and family or caregiver, ordering medications, tests or procedures, and documenting clinical information in the EHR including the differential Dx, treatment, and any further evaluation and other management of orthostasis, chronic back pain, ckd, hyperglycemia, hld, vit d def

## 2020-02-12 NOTE — Assessment & Plan Note (Signed)
Cont oral replacement 

## 2020-02-12 NOTE — Assessment & Plan Note (Signed)
Ok to try otc lidocaine patch

## 2020-02-12 NOTE — Assessment & Plan Note (Signed)
stable overall by history and exam, recent data reviewed with pt, and pt to continue medical treatment as before,  to f/u any worsening symptoms or concerns  

## 2020-02-12 NOTE — Assessment & Plan Note (Signed)
Lab Results  Component Value Date   LDLCALC 154 (H) 12/08/2014   declnes statin for now given other co morbids

## 2020-02-22 DIAGNOSIS — R350 Frequency of micturition: Secondary | ICD-10-CM | POA: Diagnosis not present

## 2020-02-22 DIAGNOSIS — N401 Enlarged prostate with lower urinary tract symptoms: Secondary | ICD-10-CM | POA: Diagnosis not present

## 2020-02-22 DIAGNOSIS — R3911 Hesitancy of micturition: Secondary | ICD-10-CM | POA: Diagnosis not present

## 2020-02-22 DIAGNOSIS — M47816 Spondylosis without myelopathy or radiculopathy, lumbar region: Secondary | ICD-10-CM | POA: Diagnosis not present

## 2020-03-29 DIAGNOSIS — M47816 Spondylosis without myelopathy or radiculopathy, lumbar region: Secondary | ICD-10-CM | POA: Diagnosis not present

## 2020-04-18 ENCOUNTER — Other Ambulatory Visit: Payer: Self-pay | Admitting: Cardiology

## 2020-04-30 DIAGNOSIS — N1832 Chronic kidney disease, stage 3b: Secondary | ICD-10-CM | POA: Diagnosis not present

## 2020-05-13 ENCOUNTER — Other Ambulatory Visit: Payer: Self-pay | Admitting: Cardiology

## 2020-05-16 DIAGNOSIS — M47816 Spondylosis without myelopathy or radiculopathy, lumbar region: Secondary | ICD-10-CM | POA: Diagnosis not present

## 2020-05-28 DIAGNOSIS — R399 Unspecified symptoms and signs involving the genitourinary system: Secondary | ICD-10-CM | POA: Diagnosis not present

## 2020-05-28 DIAGNOSIS — I129 Hypertensive chronic kidney disease with stage 1 through stage 4 chronic kidney disease, or unspecified chronic kidney disease: Secondary | ICD-10-CM | POA: Diagnosis not present

## 2020-05-28 DIAGNOSIS — N200 Calculus of kidney: Secondary | ICD-10-CM | POA: Diagnosis not present

## 2020-05-28 DIAGNOSIS — E785 Hyperlipidemia, unspecified: Secondary | ICD-10-CM | POA: Diagnosis not present

## 2020-05-28 DIAGNOSIS — N1832 Chronic kidney disease, stage 3b: Secondary | ICD-10-CM | POA: Diagnosis not present

## 2020-05-28 DIAGNOSIS — G20 Parkinson's disease: Secondary | ICD-10-CM | POA: Diagnosis not present

## 2020-05-28 DIAGNOSIS — G903 Multi-system degeneration of the autonomic nervous system: Secondary | ICD-10-CM | POA: Diagnosis not present

## 2020-06-05 DIAGNOSIS — G20 Parkinson's disease: Secondary | ICD-10-CM | POA: Diagnosis not present

## 2020-06-05 DIAGNOSIS — Z9682 Presence of neurostimulator: Secondary | ICD-10-CM | POA: Diagnosis not present

## 2020-06-05 DIAGNOSIS — Z79899 Other long term (current) drug therapy: Secondary | ICD-10-CM | POA: Diagnosis not present

## 2020-06-05 DIAGNOSIS — Z9689 Presence of other specified functional implants: Secondary | ICD-10-CM | POA: Diagnosis not present

## 2020-06-05 DIAGNOSIS — N289 Disorder of kidney and ureter, unspecified: Secondary | ICD-10-CM | POA: Diagnosis not present

## 2020-06-05 DIAGNOSIS — Z888 Allergy status to other drugs, medicaments and biological substances status: Secondary | ICD-10-CM | POA: Diagnosis not present

## 2020-06-05 DIAGNOSIS — K117 Disturbances of salivary secretion: Secondary | ICD-10-CM | POA: Diagnosis not present

## 2020-06-05 DIAGNOSIS — I951 Orthostatic hypotension: Secondary | ICD-10-CM | POA: Diagnosis not present

## 2020-06-27 DIAGNOSIS — L821 Other seborrheic keratosis: Secondary | ICD-10-CM | POA: Diagnosis not present

## 2020-06-27 DIAGNOSIS — L249 Irritant contact dermatitis, unspecified cause: Secondary | ICD-10-CM | POA: Diagnosis not present

## 2020-06-27 DIAGNOSIS — L570 Actinic keratosis: Secondary | ICD-10-CM | POA: Diagnosis not present

## 2020-06-27 DIAGNOSIS — L578 Other skin changes due to chronic exposure to nonionizing radiation: Secondary | ICD-10-CM | POA: Diagnosis not present

## 2020-06-27 DIAGNOSIS — L812 Freckles: Secondary | ICD-10-CM | POA: Diagnosis not present

## 2020-06-27 DIAGNOSIS — N401 Enlarged prostate with lower urinary tract symptoms: Secondary | ICD-10-CM | POA: Diagnosis not present

## 2020-06-27 DIAGNOSIS — Z85828 Personal history of other malignant neoplasm of skin: Secondary | ICD-10-CM | POA: Diagnosis not present

## 2020-06-28 DIAGNOSIS — M47816 Spondylosis without myelopathy or radiculopathy, lumbar region: Secondary | ICD-10-CM | POA: Diagnosis not present

## 2020-07-04 DIAGNOSIS — R351 Nocturia: Secondary | ICD-10-CM | POA: Diagnosis not present

## 2020-07-04 DIAGNOSIS — N401 Enlarged prostate with lower urinary tract symptoms: Secondary | ICD-10-CM | POA: Diagnosis not present

## 2020-07-04 DIAGNOSIS — R972 Elevated prostate specific antigen [PSA]: Secondary | ICD-10-CM | POA: Diagnosis not present

## 2020-07-09 DIAGNOSIS — Z9689 Presence of other specified functional implants: Secondary | ICD-10-CM | POA: Diagnosis not present

## 2020-07-09 DIAGNOSIS — G20 Parkinson's disease: Secondary | ICD-10-CM | POA: Diagnosis not present

## 2020-07-19 ENCOUNTER — Other Ambulatory Visit: Payer: Self-pay | Admitting: Cardiology

## 2020-07-22 ENCOUNTER — Encounter: Payer: Self-pay | Admitting: Internal Medicine

## 2020-07-23 MED ORDER — SCOPOLAMINE 1 MG/3DAYS TD PT72: 1.0000 | MEDICATED_PATCH | TRANSDERMAL | 0 refills | Status: DC

## 2020-07-25 DIAGNOSIS — M47816 Spondylosis without myelopathy or radiculopathy, lumbar region: Secondary | ICD-10-CM | POA: Diagnosis not present

## 2020-07-25 DIAGNOSIS — M4004 Postural kyphosis, thoracic region: Secondary | ICD-10-CM | POA: Diagnosis not present

## 2020-07-25 DIAGNOSIS — G20 Parkinson's disease: Secondary | ICD-10-CM | POA: Diagnosis not present

## 2020-07-29 ENCOUNTER — Other Ambulatory Visit: Payer: Self-pay | Admitting: Cardiology

## 2020-08-08 ENCOUNTER — Other Ambulatory Visit: Payer: Self-pay | Admitting: Internal Medicine

## 2020-08-08 NOTE — Telephone Encounter (Signed)
Please refill as per office routine med refill policy (all routine meds refilled for 3 mo or monthly per pt preference up to one year from last visit, then month to month grace period for 3 mo, then further med refills will have to be denied)  

## 2020-08-17 ENCOUNTER — Other Ambulatory Visit: Payer: Self-pay

## 2020-08-17 ENCOUNTER — Ambulatory Visit: Payer: Medicare PPO | Admitting: Internal Medicine

## 2020-08-17 ENCOUNTER — Encounter: Payer: Self-pay | Admitting: Internal Medicine

## 2020-08-17 VITALS — BP 118/76 | HR 80 | Temp 97.8°F | Ht 70.0 in | Wt 160.0 lb

## 2020-08-17 DIAGNOSIS — R739 Hyperglycemia, unspecified: Secondary | ICD-10-CM

## 2020-08-17 DIAGNOSIS — E7800 Pure hypercholesterolemia, unspecified: Secondary | ICD-10-CM | POA: Diagnosis not present

## 2020-08-17 DIAGNOSIS — G20 Parkinson's disease: Secondary | ICD-10-CM

## 2020-08-17 DIAGNOSIS — E538 Deficiency of other specified B group vitamins: Secondary | ICD-10-CM

## 2020-08-17 DIAGNOSIS — N1832 Chronic kidney disease, stage 3b: Secondary | ICD-10-CM | POA: Diagnosis not present

## 2020-08-17 DIAGNOSIS — N138 Other obstructive and reflux uropathy: Secondary | ICD-10-CM

## 2020-08-17 DIAGNOSIS — Z0001 Encounter for general adult medical examination with abnormal findings: Secondary | ICD-10-CM

## 2020-08-17 DIAGNOSIS — E559 Vitamin D deficiency, unspecified: Secondary | ICD-10-CM

## 2020-08-17 DIAGNOSIS — N401 Enlarged prostate with lower urinary tract symptoms: Secondary | ICD-10-CM | POA: Diagnosis not present

## 2020-08-17 DIAGNOSIS — Z23 Encounter for immunization: Secondary | ICD-10-CM

## 2020-08-17 LAB — VITAMIN D 25 HYDROXY (VIT D DEFICIENCY, FRACTURES): VITD: 41.74 ng/mL (ref 30.00–100.00)

## 2020-08-17 LAB — CBC WITH DIFFERENTIAL/PLATELET
Basophils Absolute: 0.1 10*3/uL (ref 0.0–0.1)
Basophils Relative: 1.2 % (ref 0.0–3.0)
Eosinophils Absolute: 0.1 10*3/uL (ref 0.0–0.7)
Eosinophils Relative: 1.5 % (ref 0.0–5.0)
HCT: 33.6 % — ABNORMAL LOW (ref 39.0–52.0)
Hemoglobin: 11.6 g/dL — ABNORMAL LOW (ref 13.0–17.0)
Lymphocytes Relative: 16.7 % (ref 12.0–46.0)
Lymphs Abs: 1.5 10*3/uL (ref 0.7–4.0)
MCHC: 34.4 g/dL (ref 30.0–36.0)
MCV: 93.5 fl (ref 78.0–100.0)
Monocytes Absolute: 0.8 10*3/uL (ref 0.1–1.0)
Monocytes Relative: 9.1 % (ref 3.0–12.0)
Neutro Abs: 6.3 10*3/uL (ref 1.4–7.7)
Neutrophils Relative %: 71.5 % (ref 43.0–77.0)
Platelets: 222 10*3/uL (ref 150.0–400.0)
RBC: 3.59 Mil/uL — ABNORMAL LOW (ref 4.22–5.81)
RDW: 15.3 % (ref 11.5–15.5)
WBC: 8.8 10*3/uL (ref 4.0–10.5)

## 2020-08-17 LAB — LIPID PANEL
Cholesterol: 126 mg/dL (ref 0–200)
HDL: 45.2 mg/dL (ref >39.00)
LDL Cholesterol: 56 mg/dL (ref 0–99)
NonHDL: 81.06
Total CHOL/HDL Ratio: 3
Triglycerides: 123 mg/dL (ref 0.0–149.0)
VLDL: 24.6 mg/dL (ref 0.0–40.0)

## 2020-08-17 LAB — HEPATIC FUNCTION PANEL
ALT: 4 U/L (ref 0–53)
AST: 13 U/L (ref 0–37)
Albumin: 3.9 g/dL (ref 3.5–5.2)
Alkaline Phosphatase: 75 U/L (ref 39–117)
Bilirubin, Direct: 0.1 mg/dL (ref 0.0–0.3)
Total Bilirubin: 0.5 mg/dL (ref 0.2–1.2)
Total Protein: 5.9 g/dL — ABNORMAL LOW (ref 6.0–8.3)

## 2020-08-17 LAB — VITAMIN B12: Vitamin B-12: 209 pg/mL — ABNORMAL LOW (ref 211–911)

## 2020-08-17 LAB — BASIC METABOLIC PANEL
BUN: 46 mg/dL — ABNORMAL HIGH (ref 6–23)
CO2: 26 mEq/L (ref 19–32)
Calcium: 9 mg/dL (ref 8.4–10.5)
Chloride: 107 mEq/L (ref 96–112)
Creatinine, Ser: 3.04 mg/dL — ABNORMAL HIGH (ref 0.40–1.50)
GFR: 20.26 mL/min — ABNORMAL LOW (ref >60.00)
Glucose, Bld: 88 mg/dL (ref 70–99)
Potassium: 4.4 mEq/L (ref 3.5–5.1)
Sodium: 141 mEq/L (ref 135–145)

## 2020-08-17 LAB — TSH: TSH: 2.68 u[IU]/mL (ref 0.35–4.50)

## 2020-08-17 LAB — PSA: PSA: 3.29 ng/mL (ref 0.10–4.00)

## 2020-08-17 LAB — HEMOGLOBIN A1C: Hgb A1c MFr Bld: 5.6 % (ref 4.6–6.5)

## 2020-08-17 NOTE — Assessment & Plan Note (Signed)
Last vitamin D Lab Results  Component Value Date   VD25OH 24.38 (L) 06/06/2019   Low to start oral replacement

## 2020-08-17 NOTE — Patient Instructions (Addendum)
You had the Pneumovax pneumonia shot today  Please continue all other medications as before, and refills have been done if requested.  Please have the pharmacy call with any other refills you may need.  Please continue your efforts at being more active, low cholesterol diet, and weight control.  You are otherwise up to date with prevention measures today.  Please keep your appointments with your specialists as you may have planned  You are given the handicapped parking application today (signed)  Please go to the LAB at the blood drawing area for the tests to be done  You will be contacted by phone if any changes need to be made immediately.  Otherwise, you will receive a letter about your results with an explanation, but please check with MyChart first.  Please remember to sign up for MyChart if you have not done so, as this will be important to you in the future with finding out test results, communicating by private email, and scheduling acute appointments online when needed.  Please make an Appointment to return for your 1 year visit, or sooner if needed

## 2020-08-17 NOTE — Progress Notes (Signed)
Patient ID: John Herring, male   DOB: 12-12-1950, 70 y.o.   MRN: [id number]         Chief Complaint:: wellness exam and low vit d, ckd, hyperglycemia, hld       HPI:  John Herring is a 70 y.o. male here for wellness exam; considering covid second booster about mid may. Due pneumovax,  O/w up to date with preventive referrals and immunizations               Also seeing Dr Hartford Poli [facility]iology at [facility] who is managing midodrine with very few falls recently, also follows with neurology, neurosurgury, and renal q 6 mo.  Considering to Taking Vit D 2000 u qd.   Pt denies chest pain, increased sob or doe, wheezing, orthopnea, PND, increased LE swelling, palpitations,  or syncope.    Pt denies polydipsia, polyuria, or new focal neuro s/s.   Pt denies fever, wt loss, night sweats, loss of appetite, or other constitutional symptoms  No other new complaints   Wt Readings from Last 3 Encounters:  08/17/20 160 lb (72.6 kg)  02/07/20 166 lb (75.3 kg)  08/08/19 170 lb (77.1 kg)   BP Readings from Last 3 Encounters:  08/17/20 118/76  02/07/20 118/60  08/08/19 (!) 160/110   Immunization History  Administered Date(s) Administered  . Fluad Quad(high Dose 65+) 01/05/2019, 02/07/2020  . Influenza,inj,Quad PF,6+ Mos 02/09/2015  . Influenza-Unspecified 12/20/2012, 01/31/2017, 01/02/2018  . PFIZER(Purple Top)SARS-COV-2 Vaccination 05/10/2019, 05/31/2019, 01/17/2020  . Pneumococcal Conjugate-13 08/08/2019  . Pneumococcal Polysaccharide-23 08/17/2020  . Td 06/09/2008  . Tdap 08/08/2019   There are no preventive care reminders to display for this patient.    Past Medical History:  Diagnosis Date  . Arthritis   . CKD (chronic kidney disease), stage III ([place])   . Depression 05/06/2011  . Enlarged prostate   . Former consumption of alcohol   . GERD (gastroesophageal reflux disease) 12/02/2012   patient denies this dx   . History of kidney stones    passed stone  . Hyperlipidemia 05/06/2011  . Hypersomnia  05/06/2011  . Hypotension   . 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[place]) 05/06/2011  . Memory loss 07/12/2012  . 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 Skin cancer 05/06/2011  . Syncope    orthostatic syncope, no problems in tha last 3-4 months   Past Surgical History:  Procedure Laterality Date  . BASAL CELL CARCINOMA EXCISION N/A 03/15/2018   Procedure: RE EXCISION BACK LESION;  Surgeon: Irene Limbo, MD;  Location: [place];  Service: Plastics;  Laterality: N/A;  . CARDIAC CATHETERIZATION  02/28/2019  . COLONOSCOPY  2013  . deep brain stimulation     Parkinson's disease  . LEFT HEART CATH AND CORONARY ANGIOGRAPHY N/A 02/28/2019   Procedure: LEFT HEART CATH AND CORONARY ANGIOGRAPHY;  Surgeon: Nelva Bush, MD;  Location: [place] [facility];  Service: Cardiovascular;  Laterality: N/A;  . MASS EXCISION N/A 02/12/2018   Procedure: excision lesion back, layered closure 15 cm;  Surgeon: Irene Limbo, MD;  Location: [place];  Service: Plastics;  Laterality: N/A;  . SKIN SPLIT GRAFT Left 03/15/2018   Procedure: SKIN GRAFT FROM RIGHT OR LEFT THIGH;  Surgeon: Irene Limbo, MD;  Location: [place];  Service: Plastics;  Laterality: Left;  . SUBTHALAMIC STIMULATOR BATTERY REPLACEMENT N/A 09/01/2012   Procedure: SUBTHALAMIC STIMULATOR BATTERY REPLACEMENT;  Surgeon: Erline Levine, MD;  Location: [place] [facility];  Service: Neurosurgery;  Laterality: N/A;  Deep  Brain Stimulator battery change  . SUBTHALAMIC STIMULATOR BATTERY REPLACEMENT N/A 07/19/2015   Procedure: Deep Brain stimulator battery replacement;  Surgeon: Erline Levine, MD;  Location: [place] [facility];  Service: Neurosurgery;  Laterality: N/A;  Deep Brain stimulator battery replacement  . SUBTHALAMIC STIMULATOR BATTERY REPLACEMENT N/A 01/20/2019   Procedure: Deep brain stimulator battery change;  Surgeon: Erline Levine, MD;  Location: [place];  Service: Neurosurgery;  Laterality: N/A;  Deep brain  stimulator battery change  . TONSILLECTOMY     As a child    reports that he has never smoked. He has never used smokeless tobacco. He reports that he does not drink alcohol and does not use drugs. family history is not on file. Allergies  Allergen Reactions  . Statins     Makes parkinson worse, blood pressure drop   Current Outpatient Medications on File Prior to Visit  Medication Sig Dispense Refill  . amantadine (SYMMETREL) 100 MG capsule Take 100 mg by mouth 2 (two) times daily.     Marland Kitchen aspirin EC 81 MG tablet Take 1 tablet (81 mg total) by mouth daily. 90 tablet 3  . carbidopa-levodopa (SINEMET IR) 25-100 MG tablet Take 1 tablet by mouth 6 (six) times daily. Taking 1 tablet every 2-3 hours daily    . dextromethorphan (DELSYM) 30 MG/5ML liquid Take by mouth.    . diclofenac (VOLTAREN) 50 MG EC tablet     . ezetimibe (ZETIA) 10 MG tablet TAKE 1 TABLET BY MOUTH EVERY DAY 30 tablet 0  . midodrine (PROAMATINE) 5 MG tablet TAKE 1 TABLET (5 MG TOTAL) BY MOUTH 3 (THREE) TIMES DAILY WITH MEALS. 270 tablet 3  . rosuvastatin (CRESTOR) 5 MG tablet TAKE 1 TABLET BY MOUTH EVERY DAY **NEED OFFICE VISIT FOR REFILLS** 30 tablet 0  . scopolamine (TRANSDERM-SCOP, 1.5 MG,) 1 MG/3DAYS Place 1 patch (1.5 mg total) onto the skin every 3 (three) days. 10 patch 0   No current facility-administered medications on file prior to visit.        ROS:  All others reviewed and negative.  Objective        PE:  BP 118/76 (BP Location: Right Arm, Patient Position: Sitting, Cuff Size: Large)   Pulse 80   Temp 97.8 F (36.6 C) (Oral)   Ht 5\' 10"  (1.778 m)   Wt 160 lb (72.6 kg)   SpO2 99%   BMI 22.96 kg/m                 Constitutional: Pt appears in NAD               HENT: Head: NCAT.                Right Ear: External ear normal.                 Left Ear: External ear normal.                Eyes: . Pupils are equal, round, and reactive to light. Conjunctivae and EOM are normal               Nose: without  d/c or deformity               Neck: Neck supple. Gross normal ROM               Cardiovascular: Normal rate and regular rhythm.                 Pulmonary/Chest: Effort  normal and breath sounds without rales or wheezing.                Abd:  Soft, NT, ND, + BS, no organomegaly               Neurological: Pt is alert. At baseline orientation, motor grossly intact, + involuntary PD movements mostly left sided, + masked facies but surprisingly fast gait                Skin: Skin is warm. No rashes, no other new lesions, LE edema - none               Psychiatric: Pt behavior is normal without agitation   Micro: none  Cardiac tracings I have personally interpreted today:  none  Pertinent Radiological findings (summarize): none   Lab Results  Component Value Date   WBC 8.8 08/17/2020   HGB 11.6 (L) 08/17/2020   HCT 33.6 (L) 08/17/2020   PLT 222.0 08/17/2020   GLUCOSE 88 08/17/2020   CHOL 126 08/17/2020   TRIG 123.0 08/17/2020   HDL 45.20 08/17/2020   LDLDIRECT 76.0 06/06/2019   LDLCALC 56 08/17/2020   ALT 4 08/17/2020   AST 13 08/17/2020   NA 141 08/17/2020   K 4.4 08/17/2020   CL 107 08/17/2020   CREATININE 3.04 (H) 08/17/2020   BUN 46 (H) 08/17/2020   CO2 26 08/17/2020   TSH 2.68 08/17/2020   PSA 3.29 08/17/2020   HGBA1C 5.6 08/17/2020   Assessment/Plan:  John Herring is a 70 y.o. 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  Vitamin D deficiency Last vitamin D Lab Results  Component Value Date   VD25OH 24.38 (L) 06/06/2019   Low to  start oral replacement  Encounter for well adult exam with abnormal findings Age and sex appropriate education and counseling updated with regular exercise and diet Referrals for preventative services - none needed Immunizations addressed - for covid booster soon, and pneumovax today Smoking counseling  - none needed Evidence for depression or other mood disorder - none significant Most recent labs reviewed. I have personally reviewed and have noted: 1) the patient's medical and social history 2) The patient's current medications and supplements 3) The patient's height, weight, and BMI have been recorded in the chart   Hyperlipidemia Lab Results  Component Value Date   [place] 56 08/17/2020   Stable, pt to continue current statin crestor 5   Hyperglycemia Lab Results  Component Value Date   HGBA1C 5.6 08/17/2020   Stable, pt to continue current medical treatment  - diet   CKD (chronic kidney disease) stage 3, GFR 30-59 ml/min  Stable overall, cont to avoid nephrotoxins, and f/u renal fxn, also sees renal q 6 mo   BPH with urinary obstruction Currently asympt doing well, no change in tx needed  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[place]) Also for handicapped parking application signed today, cont same tx, f/u neurology and cards as planned  Followup: Return in about 1 year (around 08/17/2021).  Cathlean Cower, MD 08/18/2020 2:00 AM [facility]

## 2020-08-18 ENCOUNTER — Encounter: Payer: Self-pay | Admitting: Internal Medicine

## 2020-08-18 NOTE — Assessment & Plan Note (Signed)
Lab Results  ?Component Value Date  ? HGBA1C 5.6 08/17/2020  ? ?Stable, pt to continue current medical treatment  - diet ? ?

## 2020-08-18 NOTE — Assessment & Plan Note (Signed)
Lab Results  °Component Value Date  ° LDLCALC 56 08/17/2020  ° °Stable, pt to continue current statin crestor 5 ° °

## 2020-08-18 NOTE — Assessment & Plan Note (Addendum)
Also for handicapped parking application signed today, cont same tx, f/u neurology and cards as planned

## 2020-08-18 NOTE — Assessment & Plan Note (Addendum)
Age and sex appropriate education and counseling updated with regular exercise and diet Referrals for preventative services - none needed Immunizations addressed - for covid booster soon, and pneumovax today Smoking counseling  - none needed Evidence for depression or other mood disorder - none significant Most recent labs reviewed. I have personally reviewed and have noted: 1) the patient's medical and social history 2) The patient's current medications and supplements 3) The patient's height, weight, and BMI have been recorded in the chart

## 2020-08-18 NOTE — Assessment & Plan Note (Addendum)
Currently asympt doing well, no change in tx needed

## 2020-08-18 NOTE — Assessment & Plan Note (Signed)
  Stable overall, cont to avoid nephrotoxins, and f/u renal fxn, also sees renal q 6 mo

## 2020-08-20 ENCOUNTER — Encounter: Payer: Self-pay | Admitting: Internal Medicine

## 2020-08-20 DIAGNOSIS — H2513 Age-related nuclear cataract, bilateral: Secondary | ICD-10-CM | POA: Diagnosis not present

## 2020-08-20 DIAGNOSIS — H35372 Puckering of macula, left eye: Secondary | ICD-10-CM | POA: Diagnosis not present

## 2020-08-23 ENCOUNTER — Other Ambulatory Visit: Payer: Self-pay | Admitting: Cardiology

## 2020-09-03 DIAGNOSIS — R030 Elevated blood-pressure reading, without diagnosis of hypertension: Secondary | ICD-10-CM | POA: Diagnosis not present

## 2020-09-03 DIAGNOSIS — G20 Parkinson's disease: Secondary | ICD-10-CM | POA: Diagnosis not present

## 2020-09-03 DIAGNOSIS — Z9181 History of falling: Secondary | ICD-10-CM | POA: Diagnosis not present

## 2020-09-03 DIAGNOSIS — I951 Orthostatic hypotension: Secondary | ICD-10-CM | POA: Diagnosis not present

## 2020-09-04 ENCOUNTER — Other Ambulatory Visit: Payer: Self-pay | Admitting: Internal Medicine

## 2020-09-05 DIAGNOSIS — G20 Parkinson's disease: Secondary | ICD-10-CM | POA: Diagnosis not present

## 2020-09-05 DIAGNOSIS — Z79899 Other long term (current) drug therapy: Secondary | ICD-10-CM | POA: Diagnosis not present

## 2020-09-05 DIAGNOSIS — I951 Orthostatic hypotension: Secondary | ICD-10-CM | POA: Diagnosis not present

## 2020-09-05 DIAGNOSIS — Z888 Allergy status to other drugs, medicaments and biological substances status: Secondary | ICD-10-CM | POA: Diagnosis not present

## 2020-09-09 ENCOUNTER — Other Ambulatory Visit: Payer: Self-pay | Admitting: Internal Medicine

## 2020-09-14 ENCOUNTER — Other Ambulatory Visit: Payer: Self-pay | Admitting: Cardiology

## 2020-09-18 DIAGNOSIS — M47816 Spondylosis without myelopathy or radiculopathy, lumbar region: Secondary | ICD-10-CM | POA: Diagnosis not present

## 2020-09-18 DIAGNOSIS — M4004 Postural kyphosis, thoracic region: Secondary | ICD-10-CM | POA: Diagnosis not present

## 2020-09-18 DIAGNOSIS — G20 Parkinson's disease: Secondary | ICD-10-CM | POA: Diagnosis not present

## 2020-09-28 ENCOUNTER — Other Ambulatory Visit: Payer: Self-pay | Admitting: Cardiology

## 2020-09-28 ENCOUNTER — Telehealth: Payer: Self-pay | Admitting: Cardiology

## 2020-09-28 DIAGNOSIS — N401 Enlarged prostate with lower urinary tract symptoms: Secondary | ICD-10-CM | POA: Diagnosis not present

## 2020-09-28 DIAGNOSIS — R350 Frequency of micturition: Secondary | ICD-10-CM | POA: Diagnosis not present

## 2020-09-28 DIAGNOSIS — R972 Elevated prostate specific antigen [PSA]: Secondary | ICD-10-CM | POA: Diagnosis not present

## 2020-09-28 MED ORDER — ROSUVASTATIN CALCIUM 5 MG PO TABS: 5.0000 mg | ORAL_TABLET | Freq: Every day | ORAL | 4 refills | Status: DC

## 2020-09-28 NOTE — Telephone Encounter (Signed)
Pt's medication was sent to pt's pharmacy as requested. Confirmation received.  °

## 2020-09-28 NOTE — Telephone Encounter (Signed)
*  STAT* If patient is at the pharmacy, call can be transferred to refill team.   1. Which medications need to be refilled? (please list name of each medication and dose if known) rosuvastatin (CRESTOR) 5 MG tablet  2. Which pharmacy/location (including street and city if local pharmacy) is medication to be sent to? [id number] - [place], [place] - [address]   3. Do they need a 30 day or 90 day supply? 30 DS

## 2020-11-19 DIAGNOSIS — N1832 Chronic kidney disease, stage 3b: Secondary | ICD-10-CM | POA: Diagnosis not present

## 2020-11-26 DIAGNOSIS — G903 Multi-system degeneration of the autonomic nervous system: Secondary | ICD-10-CM | POA: Diagnosis not present

## 2020-11-26 DIAGNOSIS — R399 Unspecified symptoms and signs involving the genitourinary system: Secondary | ICD-10-CM | POA: Diagnosis not present

## 2020-11-26 DIAGNOSIS — E785 Hyperlipidemia, unspecified: Secondary | ICD-10-CM | POA: Diagnosis not present

## 2020-11-26 DIAGNOSIS — N200 Calculus of kidney: Secondary | ICD-10-CM | POA: Diagnosis not present

## 2020-11-26 DIAGNOSIS — G20 Parkinson's disease: Secondary | ICD-10-CM | POA: Diagnosis not present

## 2020-11-26 DIAGNOSIS — N184 Chronic kidney disease, stage 4 (severe): Secondary | ICD-10-CM | POA: Diagnosis not present

## 2020-12-15 DIAGNOSIS — S4992XA Unspecified injury of left shoulder and upper arm, initial encounter: Secondary | ICD-10-CM | POA: Diagnosis not present

## 2020-12-15 DIAGNOSIS — I517 Cardiomegaly: Secondary | ICD-10-CM | POA: Diagnosis not present

## 2020-12-15 DIAGNOSIS — W19XXXA Unspecified fall, initial encounter: Secondary | ICD-10-CM | POA: Diagnosis not present

## 2020-12-15 DIAGNOSIS — S299XXA Unspecified injury of thorax, initial encounter: Secondary | ICD-10-CM | POA: Diagnosis not present

## 2020-12-15 DIAGNOSIS — S43402A Unspecified sprain of left shoulder joint, initial encounter: Secondary | ICD-10-CM | POA: Diagnosis not present

## 2020-12-15 DIAGNOSIS — J984 Other disorders of lung: Secondary | ICD-10-CM | POA: Diagnosis not present

## 2020-12-15 DIAGNOSIS — J90 Pleural effusion, not elsewhere classified: Secondary | ICD-10-CM | POA: Diagnosis not present

## 2020-12-17 DIAGNOSIS — A4101 Sepsis due to Methicillin susceptible Staphylococcus aureus: Secondary | ICD-10-CM | POA: Diagnosis not present

## 2020-12-17 DIAGNOSIS — S2241XA Multiple fractures of ribs, right side, initial encounter for closed fracture: Secondary | ICD-10-CM | POA: Diagnosis not present

## 2020-12-17 DIAGNOSIS — N189 Chronic kidney disease, unspecified: Secondary | ICD-10-CM | POA: Diagnosis not present

## 2020-12-17 DIAGNOSIS — A419 Sepsis, unspecified organism: Secondary | ICD-10-CM | POA: Diagnosis not present

## 2020-12-17 DIAGNOSIS — S270XXA Traumatic pneumothorax, initial encounter: Secondary | ICD-10-CM | POA: Diagnosis not present

## 2020-12-17 DIAGNOSIS — Z4682 Encounter for fitting and adjustment of non-vascular catheter: Secondary | ICD-10-CM | POA: Diagnosis not present

## 2020-12-17 DIAGNOSIS — S271XXA Traumatic hemothorax, initial encounter: Secondary | ICD-10-CM | POA: Diagnosis not present

## 2020-12-17 DIAGNOSIS — J942 Hemothorax: Secondary | ICD-10-CM | POA: Diagnosis not present

## 2020-12-17 DIAGNOSIS — Z20822 Contact with and (suspected) exposure to covid-19: Secondary | ICD-10-CM | POA: Diagnosis not present

## 2020-12-17 DIAGNOSIS — S32010A Wedge compression fracture of first lumbar vertebra, initial encounter for closed fracture: Secondary | ICD-10-CM | POA: Diagnosis not present

## 2020-12-17 DIAGNOSIS — Z452 Encounter for adjustment and management of vascular access device: Secondary | ICD-10-CM | POA: Diagnosis not present

## 2020-12-17 DIAGNOSIS — R0989 Other specified symptoms and signs involving the circulatory and respiratory systems: Secondary | ICD-10-CM | POA: Diagnosis not present

## 2020-12-17 DIAGNOSIS — D689 Coagulation defect, unspecified: Secondary | ICD-10-CM | POA: Diagnosis not present

## 2020-12-17 DIAGNOSIS — I951 Orthostatic hypotension: Secondary | ICD-10-CM | POA: Diagnosis not present

## 2020-12-17 DIAGNOSIS — J90 Pleural effusion, not elsewhere classified: Secondary | ICD-10-CM | POA: Diagnosis not present

## 2020-12-17 DIAGNOSIS — R519 Headache, unspecified: Secondary | ICD-10-CM | POA: Diagnosis not present

## 2020-12-17 DIAGNOSIS — D62 Acute posthemorrhagic anemia: Secondary | ICD-10-CM | POA: Diagnosis not present

## 2020-12-17 DIAGNOSIS — R7881 Bacteremia: Secondary | ICD-10-CM | POA: Diagnosis not present

## 2020-12-17 DIAGNOSIS — J918 Pleural effusion in other conditions classified elsewhere: Secondary | ICD-10-CM | POA: Diagnosis not present

## 2020-12-17 DIAGNOSIS — J9601 Acute respiratory failure with hypoxia: Secondary | ICD-10-CM | POA: Diagnosis not present

## 2020-12-17 DIAGNOSIS — J951 Acute pulmonary insufficiency following thoracic surgery: Secondary | ICD-10-CM | POA: Diagnosis not present

## 2020-12-17 DIAGNOSIS — I517 Cardiomegaly: Secondary | ICD-10-CM | POA: Diagnosis not present

## 2020-12-17 DIAGNOSIS — N200 Calculus of kidney: Secondary | ICD-10-CM | POA: Diagnosis not present

## 2020-12-17 DIAGNOSIS — S2231XA Fracture of one rib, right side, initial encounter for closed fracture: Secondary | ICD-10-CM | POA: Diagnosis not present

## 2020-12-17 DIAGNOSIS — R918 Other nonspecific abnormal finding of lung field: Secondary | ICD-10-CM | POA: Diagnosis not present

## 2020-12-17 DIAGNOSIS — S22040A Wedge compression fracture of fourth thoracic vertebra, initial encounter for closed fracture: Secondary | ICD-10-CM | POA: Diagnosis not present

## 2020-12-17 DIAGNOSIS — R1312 Dysphagia, oropharyngeal phase: Secondary | ICD-10-CM | POA: Diagnosis not present

## 2020-12-17 DIAGNOSIS — I700 Atherosclerosis of aorta: Secondary | ICD-10-CM | POA: Diagnosis not present

## 2020-12-17 DIAGNOSIS — S271XXD Traumatic hemothorax, subsequent encounter: Secondary | ICD-10-CM | POA: Diagnosis not present

## 2020-12-17 DIAGNOSIS — J95821 Acute postprocedural respiratory failure: Secondary | ICD-10-CM | POA: Diagnosis not present

## 2020-12-17 DIAGNOSIS — M542 Cervicalgia: Secondary | ICD-10-CM | POA: Diagnosis not present

## 2020-12-17 DIAGNOSIS — R0902 Hypoxemia: Secondary | ICD-10-CM | POA: Diagnosis not present

## 2020-12-17 DIAGNOSIS — G8912 Acute post-thoracotomy pain: Secondary | ICD-10-CM | POA: Diagnosis not present

## 2020-12-17 DIAGNOSIS — J189 Pneumonia, unspecified organism: Secondary | ICD-10-CM | POA: Diagnosis not present

## 2020-12-17 DIAGNOSIS — N178 Other acute kidney failure: Secondary | ICD-10-CM | POA: Diagnosis not present

## 2020-12-17 DIAGNOSIS — R0602 Shortness of breath: Secondary | ICD-10-CM | POA: Diagnosis not present

## 2020-12-17 DIAGNOSIS — J439 Emphysema, unspecified: Secondary | ICD-10-CM | POA: Diagnosis not present

## 2020-12-17 DIAGNOSIS — K5900 Constipation, unspecified: Secondary | ICD-10-CM | POA: Diagnosis not present

## 2020-12-17 DIAGNOSIS — S2243XA Multiple fractures of ribs, bilateral, initial encounter for closed fracture: Secondary | ICD-10-CM | POA: Diagnosis not present

## 2020-12-17 DIAGNOSIS — N183 Chronic kidney disease, stage 3 unspecified: Secondary | ICD-10-CM | POA: Diagnosis not present

## 2020-12-17 DIAGNOSIS — K922 Gastrointestinal hemorrhage, unspecified: Secondary | ICD-10-CM | POA: Diagnosis not present

## 2020-12-17 DIAGNOSIS — I10 Essential (primary) hypertension: Secondary | ICD-10-CM | POA: Diagnosis not present

## 2020-12-17 DIAGNOSIS — R262 Difficulty in walking, not elsewhere classified: Secondary | ICD-10-CM | POA: Diagnosis not present

## 2020-12-17 DIAGNOSIS — N480 Leukoplakia of penis: Secondary | ICD-10-CM | POA: Diagnosis not present

## 2020-12-17 DIAGNOSIS — N179 Acute kidney failure, unspecified: Secondary | ICD-10-CM | POA: Diagnosis not present

## 2020-12-17 DIAGNOSIS — J984 Other disorders of lung: Secondary | ICD-10-CM | POA: Diagnosis not present

## 2020-12-17 DIAGNOSIS — E785 Hyperlipidemia, unspecified: Secondary | ICD-10-CM | POA: Diagnosis not present

## 2020-12-17 DIAGNOSIS — S2241XD Multiple fractures of ribs, right side, subsequent encounter for fracture with routine healing: Secondary | ICD-10-CM | POA: Diagnosis not present

## 2020-12-17 DIAGNOSIS — Y929 Unspecified place or not applicable: Secondary | ICD-10-CM | POA: Diagnosis not present

## 2020-12-17 DIAGNOSIS — J869 Pyothorax without fistula: Secondary | ICD-10-CM | POA: Diagnosis not present

## 2020-12-17 DIAGNOSIS — N390 Urinary tract infection, site not specified: Secondary | ICD-10-CM | POA: Diagnosis not present

## 2020-12-17 DIAGNOSIS — W19XXXA Unspecified fall, initial encounter: Secondary | ICD-10-CM | POA: Diagnosis not present

## 2020-12-17 DIAGNOSIS — M6281 Muscle weakness (generalized): Secondary | ICD-10-CM | POA: Diagnosis not present

## 2020-12-17 DIAGNOSIS — J9600 Acute respiratory failure, unspecified whether with hypoxia or hypercapnia: Secondary | ICD-10-CM | POA: Diagnosis not present

## 2020-12-17 DIAGNOSIS — G20 Parkinson's disease: Secondary | ICD-10-CM | POA: Diagnosis not present

## 2020-12-27 DIAGNOSIS — R52 Pain, unspecified: Secondary | ICD-10-CM | POA: Insufficient documentation

## 2020-12-27 DIAGNOSIS — K5900 Constipation, unspecified: Secondary | ICD-10-CM | POA: Insufficient documentation

## 2020-12-28 ENCOUNTER — Encounter: Payer: Self-pay | Admitting: Internal Medicine

## 2020-12-28 DIAGNOSIS — R1312 Dysphagia, oropharyngeal phase: Secondary | ICD-10-CM | POA: Diagnosis not present

## 2020-12-28 DIAGNOSIS — G301 Alzheimer's disease with late onset: Secondary | ICD-10-CM | POA: Diagnosis not present

## 2020-12-28 DIAGNOSIS — S22040D Wedge compression fracture of fourth thoracic vertebra, subsequent encounter for fracture with routine healing: Secondary | ICD-10-CM | POA: Insufficient documentation

## 2020-12-28 DIAGNOSIS — I517 Cardiomegaly: Secondary | ICD-10-CM | POA: Diagnosis not present

## 2020-12-28 DIAGNOSIS — R278 Other lack of coordination: Secondary | ICD-10-CM | POA: Insufficient documentation

## 2020-12-28 DIAGNOSIS — D62 Acute posthemorrhagic anemia: Secondary | ICD-10-CM | POA: Diagnosis not present

## 2020-12-28 DIAGNOSIS — N179 Acute kidney failure, unspecified: Secondary | ICD-10-CM | POA: Diagnosis not present

## 2020-12-28 DIAGNOSIS — N390 Urinary tract infection, site not specified: Secondary | ICD-10-CM | POA: Diagnosis not present

## 2020-12-28 DIAGNOSIS — G20C Parkinsonism, unspecified: Secondary | ICD-10-CM | POA: Insufficient documentation

## 2020-12-28 DIAGNOSIS — J869 Pyothorax without fistula: Secondary | ICD-10-CM | POA: Diagnosis not present

## 2020-12-28 DIAGNOSIS — K5901 Slow transit constipation: Secondary | ICD-10-CM | POA: Diagnosis not present

## 2020-12-28 DIAGNOSIS — M6259 Muscle wasting and atrophy, not elsewhere classified, multiple sites: Secondary | ICD-10-CM | POA: Diagnosis not present

## 2020-12-28 DIAGNOSIS — J90 Pleural effusion, not elsewhere classified: Secondary | ICD-10-CM | POA: Diagnosis not present

## 2020-12-28 DIAGNOSIS — R7881 Bacteremia: Secondary | ICD-10-CM | POA: Diagnosis not present

## 2020-12-28 DIAGNOSIS — S2241XD Multiple fractures of ribs, right side, subsequent encounter for fracture with routine healing: Secondary | ICD-10-CM | POA: Diagnosis not present

## 2020-12-28 DIAGNOSIS — R262 Difficulty in walking, not elsewhere classified: Secondary | ICD-10-CM | POA: Diagnosis not present

## 2020-12-28 DIAGNOSIS — J189 Pneumonia, unspecified organism: Secondary | ICD-10-CM | POA: Diagnosis not present

## 2020-12-28 DIAGNOSIS — S271XXD Traumatic hemothorax, subsequent encounter: Secondary | ICD-10-CM | POA: Diagnosis not present

## 2020-12-28 DIAGNOSIS — A4101 Sepsis due to Methicillin susceptible Staphylococcus aureus: Secondary | ICD-10-CM | POA: Insufficient documentation

## 2020-12-28 DIAGNOSIS — J9601 Acute respiratory failure with hypoxia: Secondary | ICD-10-CM | POA: Diagnosis not present

## 2020-12-28 DIAGNOSIS — M6281 Muscle weakness (generalized): Secondary | ICD-10-CM | POA: Diagnosis not present

## 2020-12-28 DIAGNOSIS — I951 Orthostatic hypotension: Secondary | ICD-10-CM | POA: Diagnosis not present

## 2020-12-28 DIAGNOSIS — D500 Iron deficiency anemia secondary to blood loss (chronic): Secondary | ICD-10-CM | POA: Diagnosis not present

## 2020-12-28 DIAGNOSIS — T819XXA Unspecified complication of procedure, initial encounter: Secondary | ICD-10-CM | POA: Insufficient documentation

## 2020-12-28 DIAGNOSIS — S271XXA Traumatic hemothorax, initial encounter: Secondary | ICD-10-CM | POA: Insufficient documentation

## 2020-12-28 DIAGNOSIS — R296 Repeated falls: Secondary | ICD-10-CM | POA: Diagnosis not present

## 2020-12-28 DIAGNOSIS — A4901 Methicillin susceptible Staphylococcus aureus infection, unspecified site: Secondary | ICD-10-CM | POA: Diagnosis not present

## 2020-12-28 DIAGNOSIS — G20 Parkinson's disease: Secondary | ICD-10-CM | POA: Diagnosis not present

## 2020-12-28 DIAGNOSIS — R2689 Other abnormalities of gait and mobility: Secondary | ICD-10-CM | POA: Diagnosis not present

## 2020-12-28 DIAGNOSIS — J951 Acute pulmonary insufficiency following thoracic surgery: Secondary | ICD-10-CM | POA: Diagnosis not present

## 2020-12-28 DIAGNOSIS — K219 Gastro-esophageal reflux disease without esophagitis: Secondary | ICD-10-CM | POA: Diagnosis not present

## 2020-12-28 DIAGNOSIS — Z23 Encounter for immunization: Secondary | ICD-10-CM | POA: Diagnosis not present

## 2020-12-28 DIAGNOSIS — Z452 Encounter for adjustment and management of vascular access device: Secondary | ICD-10-CM | POA: Diagnosis not present

## 2020-12-28 DIAGNOSIS — K922 Gastrointestinal hemorrhage, unspecified: Secondary | ICD-10-CM | POA: Diagnosis not present

## 2020-12-31 DIAGNOSIS — N390 Urinary tract infection, site not specified: Secondary | ICD-10-CM | POA: Diagnosis not present

## 2020-12-31 DIAGNOSIS — S2241XD Multiple fractures of ribs, right side, subsequent encounter for fracture with routine healing: Secondary | ICD-10-CM | POA: Diagnosis not present

## 2020-12-31 DIAGNOSIS — R2689 Other abnormalities of gait and mobility: Secondary | ICD-10-CM | POA: Diagnosis not present

## 2020-12-31 DIAGNOSIS — M6281 Muscle weakness (generalized): Secondary | ICD-10-CM | POA: Diagnosis not present

## 2020-12-31 DIAGNOSIS — R296 Repeated falls: Secondary | ICD-10-CM | POA: Diagnosis not present

## 2021-01-01 DIAGNOSIS — G301 Alzheimer's disease with late onset: Secondary | ICD-10-CM | POA: Diagnosis not present

## 2021-01-01 DIAGNOSIS — M6259 Muscle wasting and atrophy, not elsewhere classified, multiple sites: Secondary | ICD-10-CM | POA: Diagnosis not present

## 2021-01-01 DIAGNOSIS — K219 Gastro-esophageal reflux disease without esophagitis: Secondary | ICD-10-CM | POA: Diagnosis not present

## 2021-01-01 DIAGNOSIS — K5901 Slow transit constipation: Secondary | ICD-10-CM | POA: Diagnosis not present

## 2021-01-02 DIAGNOSIS — K219 Gastro-esophageal reflux disease without esophagitis: Secondary | ICD-10-CM | POA: Diagnosis not present

## 2021-01-02 DIAGNOSIS — K5901 Slow transit constipation: Secondary | ICD-10-CM | POA: Diagnosis not present

## 2021-01-02 DIAGNOSIS — D500 Iron deficiency anemia secondary to blood loss (chronic): Secondary | ICD-10-CM | POA: Diagnosis not present

## 2021-01-02 DIAGNOSIS — M6259 Muscle wasting and atrophy, not elsewhere classified, multiple sites: Secondary | ICD-10-CM | POA: Diagnosis not present

## 2021-01-03 ENCOUNTER — Other Ambulatory Visit: Payer: Self-pay | Admitting: Cardiology

## 2021-01-04 DIAGNOSIS — K219 Gastro-esophageal reflux disease without esophagitis: Secondary | ICD-10-CM | POA: Diagnosis not present

## 2021-01-04 DIAGNOSIS — M6259 Muscle wasting and atrophy, not elsewhere classified, multiple sites: Secondary | ICD-10-CM | POA: Diagnosis not present

## 2021-01-04 DIAGNOSIS — K5901 Slow transit constipation: Secondary | ICD-10-CM | POA: Diagnosis not present

## 2021-01-04 DIAGNOSIS — D500 Iron deficiency anemia secondary to blood loss (chronic): Secondary | ICD-10-CM | POA: Diagnosis not present

## 2021-01-08 DIAGNOSIS — R2689 Other abnormalities of gait and mobility: Secondary | ICD-10-CM | POA: Diagnosis not present

## 2021-01-08 DIAGNOSIS — N390 Urinary tract infection, site not specified: Secondary | ICD-10-CM | POA: Diagnosis not present

## 2021-01-08 DIAGNOSIS — R296 Repeated falls: Secondary | ICD-10-CM | POA: Diagnosis not present

## 2021-01-08 DIAGNOSIS — D500 Iron deficiency anemia secondary to blood loss (chronic): Secondary | ICD-10-CM | POA: Diagnosis not present

## 2021-01-08 DIAGNOSIS — M6259 Muscle wasting and atrophy, not elsewhere classified, multiple sites: Secondary | ICD-10-CM | POA: Diagnosis not present

## 2021-01-08 DIAGNOSIS — K219 Gastro-esophageal reflux disease without esophagitis: Secondary | ICD-10-CM | POA: Diagnosis not present

## 2021-01-08 DIAGNOSIS — S2241XD Multiple fractures of ribs, right side, subsequent encounter for fracture with routine healing: Secondary | ICD-10-CM | POA: Diagnosis not present

## 2021-01-08 DIAGNOSIS — M6281 Muscle weakness (generalized): Secondary | ICD-10-CM | POA: Diagnosis not present

## 2021-01-08 DIAGNOSIS — K5901 Slow transit constipation: Secondary | ICD-10-CM | POA: Diagnosis not present

## 2021-01-09 DIAGNOSIS — M6259 Muscle wasting and atrophy, not elsewhere classified, multiple sites: Secondary | ICD-10-CM | POA: Diagnosis not present

## 2021-01-09 DIAGNOSIS — K219 Gastro-esophageal reflux disease without esophagitis: Secondary | ICD-10-CM | POA: Diagnosis not present

## 2021-01-09 DIAGNOSIS — D500 Iron deficiency anemia secondary to blood loss (chronic): Secondary | ICD-10-CM | POA: Diagnosis not present

## 2021-01-09 DIAGNOSIS — K5901 Slow transit constipation: Secondary | ICD-10-CM | POA: Diagnosis not present

## 2021-01-11 DIAGNOSIS — M6281 Muscle weakness (generalized): Secondary | ICD-10-CM | POA: Diagnosis not present

## 2021-01-11 DIAGNOSIS — N390 Urinary tract infection, site not specified: Secondary | ICD-10-CM | POA: Diagnosis not present

## 2021-01-11 DIAGNOSIS — R2689 Other abnormalities of gait and mobility: Secondary | ICD-10-CM | POA: Diagnosis not present

## 2021-01-11 DIAGNOSIS — K5901 Slow transit constipation: Secondary | ICD-10-CM | POA: Diagnosis not present

## 2021-01-11 DIAGNOSIS — M6259 Muscle wasting and atrophy, not elsewhere classified, multiple sites: Secondary | ICD-10-CM | POA: Diagnosis not present

## 2021-01-11 DIAGNOSIS — K219 Gastro-esophageal reflux disease without esophagitis: Secondary | ICD-10-CM | POA: Diagnosis not present

## 2021-01-11 DIAGNOSIS — S2241XD Multiple fractures of ribs, right side, subsequent encounter for fracture with routine healing: Secondary | ICD-10-CM | POA: Diagnosis not present

## 2021-01-11 DIAGNOSIS — D500 Iron deficiency anemia secondary to blood loss (chronic): Secondary | ICD-10-CM | POA: Diagnosis not present

## 2021-01-11 DIAGNOSIS — R296 Repeated falls: Secondary | ICD-10-CM | POA: Diagnosis not present

## 2021-01-14 DIAGNOSIS — N390 Urinary tract infection, site not specified: Secondary | ICD-10-CM | POA: Diagnosis not present

## 2021-01-14 DIAGNOSIS — R296 Repeated falls: Secondary | ICD-10-CM | POA: Diagnosis not present

## 2021-01-14 DIAGNOSIS — R2689 Other abnormalities of gait and mobility: Secondary | ICD-10-CM | POA: Diagnosis not present

## 2021-01-14 DIAGNOSIS — M6281 Muscle weakness (generalized): Secondary | ICD-10-CM | POA: Diagnosis not present

## 2021-01-14 DIAGNOSIS — S2241XD Multiple fractures of ribs, right side, subsequent encounter for fracture with routine healing: Secondary | ICD-10-CM | POA: Diagnosis not present

## 2021-01-15 DIAGNOSIS — M6259 Muscle wasting and atrophy, not elsewhere classified, multiple sites: Secondary | ICD-10-CM | POA: Diagnosis not present

## 2021-01-15 DIAGNOSIS — K5901 Slow transit constipation: Secondary | ICD-10-CM | POA: Diagnosis not present

## 2021-01-15 DIAGNOSIS — D500 Iron deficiency anemia secondary to blood loss (chronic): Secondary | ICD-10-CM | POA: Diagnosis not present

## 2021-01-15 DIAGNOSIS — K219 Gastro-esophageal reflux disease without esophagitis: Secondary | ICD-10-CM | POA: Diagnosis not present

## 2021-01-16 DIAGNOSIS — M6259 Muscle wasting and atrophy, not elsewhere classified, multiple sites: Secondary | ICD-10-CM | POA: Diagnosis not present

## 2021-01-16 DIAGNOSIS — K5901 Slow transit constipation: Secondary | ICD-10-CM | POA: Diagnosis not present

## 2021-01-16 DIAGNOSIS — M6281 Muscle weakness (generalized): Secondary | ICD-10-CM | POA: Diagnosis not present

## 2021-01-16 DIAGNOSIS — S2241XD Multiple fractures of ribs, right side, subsequent encounter for fracture with routine healing: Secondary | ICD-10-CM | POA: Diagnosis not present

## 2021-01-16 DIAGNOSIS — D500 Iron deficiency anemia secondary to blood loss (chronic): Secondary | ICD-10-CM | POA: Diagnosis not present

## 2021-01-16 DIAGNOSIS — N390 Urinary tract infection, site not specified: Secondary | ICD-10-CM | POA: Diagnosis not present

## 2021-01-16 DIAGNOSIS — K219 Gastro-esophageal reflux disease without esophagitis: Secondary | ICD-10-CM | POA: Diagnosis not present

## 2021-01-16 DIAGNOSIS — R2689 Other abnormalities of gait and mobility: Secondary | ICD-10-CM | POA: Diagnosis not present

## 2021-01-16 DIAGNOSIS — R296 Repeated falls: Secondary | ICD-10-CM | POA: Diagnosis not present

## 2021-01-18 DIAGNOSIS — K219 Gastro-esophageal reflux disease without esophagitis: Secondary | ICD-10-CM | POA: Diagnosis not present

## 2021-01-18 DIAGNOSIS — K5901 Slow transit constipation: Secondary | ICD-10-CM | POA: Diagnosis not present

## 2021-01-18 DIAGNOSIS — M6259 Muscle wasting and atrophy, not elsewhere classified, multiple sites: Secondary | ICD-10-CM | POA: Diagnosis not present

## 2021-01-18 DIAGNOSIS — D500 Iron deficiency anemia secondary to blood loss (chronic): Secondary | ICD-10-CM | POA: Diagnosis not present

## 2021-01-21 ENCOUNTER — Other Ambulatory Visit: Payer: Self-pay | Admitting: Cardiology

## 2021-01-22 DIAGNOSIS — D500 Iron deficiency anemia secondary to blood loss (chronic): Secondary | ICD-10-CM | POA: Diagnosis not present

## 2021-01-22 DIAGNOSIS — K5901 Slow transit constipation: Secondary | ICD-10-CM | POA: Diagnosis not present

## 2021-01-22 DIAGNOSIS — K219 Gastro-esophageal reflux disease without esophagitis: Secondary | ICD-10-CM | POA: Diagnosis not present

## 2021-01-22 DIAGNOSIS — M6259 Muscle wasting and atrophy, not elsewhere classified, multiple sites: Secondary | ICD-10-CM | POA: Diagnosis not present

## 2021-01-23 DIAGNOSIS — A4901 Methicillin susceptible Staphylococcus aureus infection, unspecified site: Secondary | ICD-10-CM | POA: Diagnosis not present

## 2021-01-23 DIAGNOSIS — R7881 Bacteremia: Secondary | ICD-10-CM | POA: Diagnosis not present

## 2021-01-23 DIAGNOSIS — J869 Pyothorax without fistula: Secondary | ICD-10-CM | POA: Diagnosis not present

## 2021-01-25 DIAGNOSIS — M6281 Muscle weakness (generalized): Secondary | ICD-10-CM | POA: Diagnosis not present

## 2021-01-25 DIAGNOSIS — R296 Repeated falls: Secondary | ICD-10-CM | POA: Diagnosis not present

## 2021-01-25 DIAGNOSIS — N390 Urinary tract infection, site not specified: Secondary | ICD-10-CM | POA: Diagnosis not present

## 2021-01-25 DIAGNOSIS — K219 Gastro-esophageal reflux disease without esophagitis: Secondary | ICD-10-CM | POA: Diagnosis not present

## 2021-01-25 DIAGNOSIS — D500 Iron deficiency anemia secondary to blood loss (chronic): Secondary | ICD-10-CM | POA: Diagnosis not present

## 2021-01-25 DIAGNOSIS — R2689 Other abnormalities of gait and mobility: Secondary | ICD-10-CM | POA: Diagnosis not present

## 2021-01-25 DIAGNOSIS — K5901 Slow transit constipation: Secondary | ICD-10-CM | POA: Diagnosis not present

## 2021-01-25 DIAGNOSIS — M6259 Muscle wasting and atrophy, not elsewhere classified, multiple sites: Secondary | ICD-10-CM | POA: Diagnosis not present

## 2021-01-25 DIAGNOSIS — S2241XD Multiple fractures of ribs, right side, subsequent encounter for fracture with routine healing: Secondary | ICD-10-CM | POA: Diagnosis not present

## 2021-01-29 DIAGNOSIS — K5901 Slow transit constipation: Secondary | ICD-10-CM | POA: Diagnosis not present

## 2021-01-29 DIAGNOSIS — K219 Gastro-esophageal reflux disease without esophagitis: Secondary | ICD-10-CM | POA: Diagnosis not present

## 2021-01-29 DIAGNOSIS — D500 Iron deficiency anemia secondary to blood loss (chronic): Secondary | ICD-10-CM | POA: Diagnosis not present

## 2021-01-29 DIAGNOSIS — M6259 Muscle wasting and atrophy, not elsewhere classified, multiple sites: Secondary | ICD-10-CM | POA: Diagnosis not present

## 2021-01-30 DIAGNOSIS — K5901 Slow transit constipation: Secondary | ICD-10-CM | POA: Diagnosis not present

## 2021-01-30 DIAGNOSIS — R2689 Other abnormalities of gait and mobility: Secondary | ICD-10-CM | POA: Diagnosis not present

## 2021-01-30 DIAGNOSIS — M6259 Muscle wasting and atrophy, not elsewhere classified, multiple sites: Secondary | ICD-10-CM | POA: Diagnosis not present

## 2021-01-30 DIAGNOSIS — K219 Gastro-esophageal reflux disease without esophagitis: Secondary | ICD-10-CM | POA: Diagnosis not present

## 2021-01-30 DIAGNOSIS — N390 Urinary tract infection, site not specified: Secondary | ICD-10-CM | POA: Diagnosis not present

## 2021-01-30 DIAGNOSIS — R296 Repeated falls: Secondary | ICD-10-CM | POA: Diagnosis not present

## 2021-01-30 DIAGNOSIS — D500 Iron deficiency anemia secondary to blood loss (chronic): Secondary | ICD-10-CM | POA: Diagnosis not present

## 2021-01-30 DIAGNOSIS — M6281 Muscle weakness (generalized): Secondary | ICD-10-CM | POA: Diagnosis not present

## 2021-01-30 DIAGNOSIS — S2241XD Multiple fractures of ribs, right side, subsequent encounter for fracture with routine healing: Secondary | ICD-10-CM | POA: Diagnosis not present

## 2021-02-01 ENCOUNTER — Ambulatory Visit: Payer: Medicare PPO | Admitting: Cardiology

## 2021-02-01 DIAGNOSIS — D500 Iron deficiency anemia secondary to blood loss (chronic): Secondary | ICD-10-CM | POA: Diagnosis not present

## 2021-02-01 DIAGNOSIS — K5901 Slow transit constipation: Secondary | ICD-10-CM | POA: Diagnosis not present

## 2021-02-01 DIAGNOSIS — M6259 Muscle wasting and atrophy, not elsewhere classified, multiple sites: Secondary | ICD-10-CM | POA: Diagnosis not present

## 2021-02-01 DIAGNOSIS — K219 Gastro-esophageal reflux disease without esophagitis: Secondary | ICD-10-CM | POA: Diagnosis not present

## 2021-02-04 DIAGNOSIS — R296 Repeated falls: Secondary | ICD-10-CM | POA: Diagnosis not present

## 2021-02-04 DIAGNOSIS — S2241XD Multiple fractures of ribs, right side, subsequent encounter for fracture with routine healing: Secondary | ICD-10-CM | POA: Diagnosis not present

## 2021-02-04 DIAGNOSIS — N390 Urinary tract infection, site not specified: Secondary | ICD-10-CM | POA: Diagnosis not present

## 2021-02-04 DIAGNOSIS — M6281 Muscle weakness (generalized): Secondary | ICD-10-CM | POA: Diagnosis not present

## 2021-02-04 DIAGNOSIS — R2689 Other abnormalities of gait and mobility: Secondary | ICD-10-CM | POA: Diagnosis not present

## 2021-02-05 DIAGNOSIS — M6259 Muscle wasting and atrophy, not elsewhere classified, multiple sites: Secondary | ICD-10-CM | POA: Diagnosis not present

## 2021-02-05 DIAGNOSIS — K5901 Slow transit constipation: Secondary | ICD-10-CM | POA: Diagnosis not present

## 2021-02-05 DIAGNOSIS — D500 Iron deficiency anemia secondary to blood loss (chronic): Secondary | ICD-10-CM | POA: Diagnosis not present

## 2021-02-05 DIAGNOSIS — K219 Gastro-esophageal reflux disease without esophagitis: Secondary | ICD-10-CM | POA: Diagnosis not present

## 2021-02-07 DIAGNOSIS — Z7982 Long term (current) use of aspirin: Secondary | ICD-10-CM | POA: Diagnosis not present

## 2021-02-07 DIAGNOSIS — J90 Pleural effusion, not elsewhere classified: Secondary | ICD-10-CM | POA: Diagnosis not present

## 2021-02-07 DIAGNOSIS — G20 Parkinson's disease: Secondary | ICD-10-CM | POA: Diagnosis not present

## 2021-02-07 DIAGNOSIS — R918 Other nonspecific abnormal finding of lung field: Secondary | ICD-10-CM | POA: Diagnosis not present

## 2021-02-07 DIAGNOSIS — R059 Cough, unspecified: Secondary | ICD-10-CM | POA: Diagnosis not present

## 2021-02-25 ENCOUNTER — Ambulatory Visit: Payer: Medicare PPO | Admitting: Internal Medicine

## 2021-02-25 ENCOUNTER — Other Ambulatory Visit: Payer: Self-pay

## 2021-02-25 ENCOUNTER — Encounter: Payer: Self-pay | Admitting: Internal Medicine

## 2021-02-25 VITALS — BP 122/70 | HR 79 | Temp 98.6°F | Ht 70.0 in | Wt 160.0 lb

## 2021-02-25 DIAGNOSIS — F03B0 Unspecified dementia, moderate, without behavioral disturbance, psychotic disturbance, mood disturbance, and anxiety: Secondary | ICD-10-CM | POA: Diagnosis not present

## 2021-02-25 DIAGNOSIS — E785 Hyperlipidemia, unspecified: Secondary | ICD-10-CM | POA: Diagnosis not present

## 2021-02-25 DIAGNOSIS — N184 Chronic kidney disease, stage 4 (severe): Secondary | ICD-10-CM | POA: Diagnosis not present

## 2021-02-25 DIAGNOSIS — F0390 Unspecified dementia without behavioral disturbance: Secondary | ICD-10-CM | POA: Insufficient documentation

## 2021-02-25 DIAGNOSIS — I951 Orthostatic hypotension: Secondary | ICD-10-CM | POA: Diagnosis not present

## 2021-02-25 DIAGNOSIS — R399 Unspecified symptoms and signs involving the genitourinary system: Secondary | ICD-10-CM | POA: Diagnosis not present

## 2021-02-25 DIAGNOSIS — G903 Multi-system degeneration of the autonomic nervous system: Secondary | ICD-10-CM | POA: Diagnosis not present

## 2021-02-25 DIAGNOSIS — R269 Unspecified abnormalities of gait and mobility: Secondary | ICD-10-CM

## 2021-02-25 DIAGNOSIS — J869 Pyothorax without fistula: Secondary | ICD-10-CM | POA: Diagnosis not present

## 2021-02-25 DIAGNOSIS — G20 Parkinson's disease: Secondary | ICD-10-CM | POA: Diagnosis not present

## 2021-02-25 DIAGNOSIS — R296 Repeated falls: Secondary | ICD-10-CM | POA: Diagnosis not present

## 2021-02-25 NOTE — Patient Instructions (Addendum)
Please consider [place] for the wheelchair foot rests  You will be contacted regarding the referral for: Home Health, likely with [facility]  Please continue all other medications as before, and refills have been done if requested.  Please have the pharmacy call with any other refills you may need.  Please continue your efforts at being more active, low cholesterol diet, and weight control.  Please keep your appointments with your specialists as you may have planned  Please make an Appointment to return in 3 months, or sooner if needed

## 2021-02-25 NOTE — Progress Notes (Signed)
Patient ID: John Herring, male   DOB: Apr 05, 1951, 70 y.o.   MRN: [id number]        Chief Complaint: follow up post hopsn       HPI:  John Herring is a 70 y.o. male here overall doing quite nicely after 7 [place]; had a nice first few days of vacation with wife (pt complains he never got in the ocean water), but then became weak and fell, requiring evaluation at the [facility] with John Herring  UTI and sepsis, and also bilateral pna, acute respiratory failure, with right empyema requiring antibx and right thoracotomy procedure (details unclear except for wife today and and ID f/u note found in [place]).  Wife states hospd aug29 to sept 9, then rehab stay from oct 9 - oct 18, then further stay with ID and pulm fo/u visits, last done oct 18, then flew back to [place] oct 29.  ID note oct 5 done in f/u for empyema, bacteremia, MSSA infection.  PICC line placed oct 4, then removed post antibx which included vancomycin, amnatadine, zosyn, cefazolin and levaquin.   Overall pt seems back to baseline except for ongoing generalized weakness, with wife much difficult handling at home.  Was able to see renal Dr Osborne Casco today with labs this am.  Pt has ongoing gait disorder persistent mild worse over baseline and several falls without injury since being at home.  Denies urinary symptoms such as dysuria, frequency, urgency, flank pain, hematuria or n/v, fever, chills.  Has no cough, and denies worsening sob .  WHeelchair needs new foot rests lost in travel       Wt Readings from Last 3 Encounters:  02/25/21 160 lb (72.6 kg)  08/17/20 160 lb (72.6 kg)  02/07/20 166 lb (75.3 kg)   BP Readings from Last 3 Encounters:  02/25/21 122/70  08/17/20 118/76  02/07/20 118/60         Past Medical History:  Diagnosis Date   Arthritis    CKD (chronic kidney disease), stage III ([place])    Depression 05/06/2011   Enlarged prostate    Former consumption of alcohol    GERD  (gastroesophageal reflux disease) 12/02/2012   patient denies this dx    History of kidney stones    passed stone   Hyperlipidemia 05/06/2011   Hypersomnia 05/06/2011   Hypotension    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[place]) 05/06/2011   Memory loss 07/12/2012   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   Skin cancer 05/06/2011   Syncope    orthostatic syncope, no problems in tha last 3-4 months   Past Surgical History:  Procedure Laterality Date   BASAL CELL CARCINOMA EXCISION N/A 03/15/2018   Procedure: RE [place];  Surgeon: John Limbo, MD;  Location: [place];  Service: Plastics;  Laterality: N/A;   CARDIAC CATHETERIZATION  02/28/2019   COLONOSCOPY  2013   deep brain stimulation     Parkinson's disease   LEFT HEART CATH AND CORONARY ANGIOGRAPHY N/A 02/28/2019   Procedure: LEFT HEART CATH AND CORONARY ANGIOGRAPHY;  Surgeon: John Bush, MD;  Location: [place] [facility];  Service: Cardiovascular;  Laterality: N/A;   MASS EXCISION N/A 02/12/2018   Procedure: excision lesion back, layered closure 15 cm;  Surgeon: John Limbo, MD;  Location: [place];  Service: Plastics;  Laterality: N/A;   SKIN SPLIT GRAFT Left 03/15/2018   Procedure: SKIN GRAFT FROM RIGHT OR LEFT THIGH;  Surgeon: Thim[place],  Arnoldo Hooker, MD;  Location: [place];  Service: Plastics;  Laterality: Left;   SUBTHALAMIC STIMULATOR BATTERY REPLACEMENT N/A 09/01/2012   Procedure: SUBTHALAMIC STIMULATOR BATTERY REPLACEMENT;  Surgeon: John Levine, MD;  Location: [place] [facility];  Service: Neurosurgery;  Laterality: N/A;  Deep Brain Stimulator battery change   SUBTHALAMIC STIMULATOR BATTERY REPLACEMENT N/A 07/19/2015   Procedure: Deep Brain stimulator battery replacement;  Surgeon: John Levine, MD;  Location: [place] [facility];  Service: Neurosurgery;  Laterality: N/A;  Deep Brain stimulator battery replacement   SUBTHALAMIC STIMULATOR BATTERY REPLACEMENT N/A 01/20/2019   Procedure:  Deep brain stimulator battery change;  Surgeon: John Levine, MD;  Location: [place];  Service: Neurosurgery;  Laterality: N/A;  Deep brain stimulator battery change   TONSILLECTOMY     As a child    reports that he has never smoked. He has never used smokeless tobacco. He reports that he does not drink alcohol and does not use drugs. family history is not on file. Allergies  Allergen Reactions   Statins     Makes parkinson worse, blood pressure drop   Current Outpatient Medications on File Prior to Visit  Medication Sig Dispense Refill   amantadine (SYMMETREL) 100 MG capsule Take 100 mg by mouth 2 (two) times daily.      aspirin EC 81 MG tablet Take 1 tablet (81 mg total) by mouth daily. 90 tablet 3   carbidopa-levodopa (SINEMET IR) 25-100 MG tablet Take 1 tablet by mouth 6 (six) times daily. Taking 1 tablet every 2-3 hours daily     Cyanocobalamin (B-12) 5000 MCG SUBL      dextromethorphan (DELSYM) 30 MG/5ML liquid Take by mouth.     diclofenac (VOLTAREN) 50 MG EC tablet      ezetimibe (ZETIA) 10 MG tablet TAKE 1 TABLET BY MOUTH EVERY DAY 90 tablet 2   finasteride (PROSCAR) 5 MG tablet = 1 Tab, ORAL, QEvening, 0 Refill(s), Maintenance     midodrine (PROAMATINE) 5 MG tablet TAKE 1 TABLET BY MOUTH 3 TIMES DAILY WITH MEALS. 270 tablet 3   rivastigmine (EXELON) 9.5 mg/24hr 1 Patch, TOP, DAILY, 0 Refill(s), Maintenance     rosuvastatin (CRESTOR) 5 MG tablet TAKE 1 TABLET BY MOUTH EVERY DAY 30 tablet 3   solifenacin (VESICARE) 5 MG tablet = 1 Tab, ORAL, QBedtime, Maintenance     No current facility-administered medications on file prior to visit.        ROS:  All others reviewed and negative.  Objective        PE:  BP 122/70 (BP Location: Left Arm, Patient Position: Sitting, Cuff Size: Large)   Pulse 79   Temp 98.6 F (37 C) (Oral)   Ht 5\' 10"  (1.778 m)   Wt 160 lb (72.6 kg)   SpO2 97%   BMI 22.96 kg/m                 Constitutional: Pt appears in NAD               HENT: Head:  NCAT.                Right Ear: External ear normal.                 Left Ear: External ear normal.                Eyes: . Pupils are equal, round, and reactive to light. Conjunctivae and EOM are normal  Nose: without d/c or deformity               Neck: Neck supple. Gross normal ROM               Cardiovascular: Normal rate and regular rhythm.                 Pulmonary/Chest: Effort normal and breath sounds without rales or wheezing.                Abd:  Soft, NT, ND, + BS, no organomegaly               Neurological: Pt is alert. At baseline orientation, motor grossly intact               Skin: Skin is warm. No rashes, no other new lesions, LE edema - none               Psychiatric: Pt behavior is normal without agitation   Micro: none  Cardiac tracings I have personally interpreted today:  none  Pertinent Radiological findings (summarize): none   Lab Results  Component Value Date   WBC 8.8 08/17/2020   HGB 11.6 (L) 08/17/2020   HCT 33.6 (L) 08/17/2020   PLT 222.0 08/17/2020   GLUCOSE 88 08/17/2020   CHOL 126 08/17/2020   TRIG 123.0 08/17/2020   HDL 45.20 08/17/2020   LDLDIRECT 76.0 06/06/2019   LDLCALC 56 08/17/2020   ALT 4 08/17/2020   AST 13 08/17/2020   NA 141 08/17/2020   K 4.4 08/17/2020   CL 107 08/17/2020   CREATININE 3.04 (H) 08/17/2020   BUN 46 (H) 08/17/2020   CO2 26 08/17/2020   TSH 2.68 08/17/2020   PSA 3.29 08/17/2020   HGBA1C 5.6 08/17/2020   Assessment/Plan:  SEBASTIEN JACKSON is a 70 y.o. 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[place]) To continue neurology follow up  Orthostatic hypotension Wife very diligent with BP monitoring in the home, cont same tx  Neurologic gait dysfunction Maybe somewhat worse generalized weakness in addition - for [facility], In with PT/OT and aide/RN to see to eavluate if is at maximal improvement post rehab currently  Frequent falls No injury recent, but for PT as above  Dementia ([place]) Likely related to PD, cont to monitor for now per wife  Followup: Return in about 3 months (around 05/28/2021).  Cathlean Cower, MD 02/25/2021 7:45 PM [facility]

## 2021-02-25 NOTE — Assessment & Plan Note (Signed)
Likely related to PD, cont to monitor for now per wife

## 2021-02-25 NOTE — Assessment & Plan Note (Signed)
Wife very diligent with BP monitoring in the home, cont same tx

## 2021-02-25 NOTE — Assessment & Plan Note (Signed)
To continue neurology follow up

## 2021-02-25 NOTE — Assessment & Plan Note (Signed)
Maybe somewhat worse generalized weakness in addition - for [facility] with PT/OT and aide/RN to see to eavluate if is at maximal improvement post rehab currently

## 2021-02-25 NOTE — Assessment & Plan Note (Signed)
No injury recent, but for PT as above

## 2021-02-26 ENCOUNTER — Encounter: Payer: Self-pay | Admitting: Internal Medicine

## 2021-02-27 ENCOUNTER — Telehealth: Payer: Self-pay | Admitting: Internal Medicine

## 2021-02-27 DIAGNOSIS — F482 Pseudobulbar affect: Secondary | ICD-10-CM | POA: Diagnosis not present

## 2021-02-27 DIAGNOSIS — F02B0 Dementia in other diseases classified elsewhere, moderate, without behavioral disturbance, psychotic disturbance, mood disturbance, and anxiety: Secondary | ICD-10-CM | POA: Diagnosis not present

## 2021-02-27 DIAGNOSIS — I25119 Atherosclerotic heart disease of native coronary artery with unspecified angina pectoris: Secondary | ICD-10-CM | POA: Diagnosis not present

## 2021-02-27 DIAGNOSIS — N183 Chronic kidney disease, stage 3 unspecified: Secondary | ICD-10-CM | POA: Diagnosis not present

## 2021-02-27 DIAGNOSIS — G20 Parkinson's disease: Secondary | ICD-10-CM | POA: Diagnosis not present

## 2021-02-27 DIAGNOSIS — R269 Unspecified abnormalities of gait and mobility: Secondary | ICD-10-CM

## 2021-02-27 DIAGNOSIS — N401 Enlarged prostate with lower urinary tract symptoms: Secondary | ICD-10-CM | POA: Diagnosis not present

## 2021-02-27 DIAGNOSIS — I951 Orthostatic hypotension: Secondary | ICD-10-CM | POA: Diagnosis not present

## 2021-02-27 DIAGNOSIS — F32A Depression, unspecified: Secondary | ICD-10-CM | POA: Diagnosis not present

## 2021-02-27 DIAGNOSIS — N138 Other obstructive and reflux uropathy: Secondary | ICD-10-CM | POA: Diagnosis not present

## 2021-02-27 NOTE — Telephone Encounter (Signed)
Ok for verbals 

## 2021-02-27 NOTE — Telephone Encounter (Signed)
Beth from [facility] called   Reported patient continues to feel weak and have multiple falls. Pt's wife states he fell this morning in the shower. He does not seem to show any signs that he is about to fall. Denies any obvious injuries. BP readings were 146/88 (sitting) pulse rate 80 and 91/63 (standing) pulse rate 84. Urine appears to be dark color and foul smell but wife states it has been like this for awhile. Pt declined [place] aide at this time.   Requesting verbals for skilled nursing-freq 1x/8wk and Social Worker eval.   Best contact #: [phone]

## 2021-02-28 ENCOUNTER — Telehealth: Payer: Self-pay | Admitting: Internal Medicine

## 2021-02-28 DIAGNOSIS — F02B0 Dementia in other diseases classified elsewhere, moderate, without behavioral disturbance, psychotic disturbance, mood disturbance, and anxiety: Secondary | ICD-10-CM | POA: Diagnosis not present

## 2021-02-28 DIAGNOSIS — N401 Enlarged prostate with lower urinary tract symptoms: Secondary | ICD-10-CM | POA: Diagnosis not present

## 2021-02-28 DIAGNOSIS — F482 Pseudobulbar affect: Secondary | ICD-10-CM | POA: Diagnosis not present

## 2021-02-28 DIAGNOSIS — F32A Depression, unspecified: Secondary | ICD-10-CM | POA: Diagnosis not present

## 2021-02-28 DIAGNOSIS — I951 Orthostatic hypotension: Secondary | ICD-10-CM | POA: Diagnosis not present

## 2021-02-28 DIAGNOSIS — N138 Other obstructive and reflux uropathy: Secondary | ICD-10-CM | POA: Diagnosis not present

## 2021-02-28 DIAGNOSIS — N183 Chronic kidney disease, stage 3 unspecified: Secondary | ICD-10-CM | POA: Diagnosis not present

## 2021-02-28 DIAGNOSIS — I25119 Atherosclerotic heart disease of native coronary artery with unspecified angina pectoris: Secondary | ICD-10-CM | POA: Diagnosis not present

## 2021-02-28 DIAGNOSIS — G20 Parkinson's disease: Secondary | ICD-10-CM | POA: Diagnosis not present

## 2021-02-28 NOTE — Telephone Encounter (Signed)
Verbals given  

## 2021-02-28 NOTE — Telephone Encounter (Signed)
[place] for verbals but would hold on abd binder for now

## 2021-02-28 NOTE — Telephone Encounter (Signed)
Beth called back to add-on a order for a bedside commode for easier access. Pt has been using the bathroom at night and has been falling.   Requesting order to be sent to [facility].   Please advise.

## 2021-02-28 NOTE — Telephone Encounter (Signed)
Order faxed to [employer]

## 2021-02-28 NOTE — Addendum Note (Signed)
Addended by: Biagio Borg on: 02/28/2021 02:42 PM   Modules accepted: Orders

## 2021-02-28 NOTE — Telephone Encounter (Signed)
Jim from [place] called   Reported severe variations of BP with change of the position. Pt is currently wearing compression socks. Requesting advise on whether patient should use abdominal binder as well for stabilization. Request verbal orders for cont'd PT; frequency 1x/1wk, 4x/2wk, and 3x/1wk.   Best contact # [phone]

## 2021-02-28 NOTE — Telephone Encounter (Signed)
Ok done hardcopy to [employer]

## 2021-02-28 NOTE — Telephone Encounter (Signed)
Left message for Beth to call me back

## 2021-03-01 DIAGNOSIS — F02B0 Dementia in other diseases classified elsewhere, moderate, without behavioral disturbance, psychotic disturbance, mood disturbance, and anxiety: Secondary | ICD-10-CM | POA: Diagnosis not present

## 2021-03-01 DIAGNOSIS — N401 Enlarged prostate with lower urinary tract symptoms: Secondary | ICD-10-CM | POA: Diagnosis not present

## 2021-03-01 DIAGNOSIS — F482 Pseudobulbar affect: Secondary | ICD-10-CM | POA: Diagnosis not present

## 2021-03-01 DIAGNOSIS — N183 Chronic kidney disease, stage 3 unspecified: Secondary | ICD-10-CM | POA: Diagnosis not present

## 2021-03-01 DIAGNOSIS — G20 Parkinson's disease: Secondary | ICD-10-CM | POA: Diagnosis not present

## 2021-03-01 DIAGNOSIS — I25119 Atherosclerotic heart disease of native coronary artery with unspecified angina pectoris: Secondary | ICD-10-CM | POA: Diagnosis not present

## 2021-03-01 DIAGNOSIS — N138 Other obstructive and reflux uropathy: Secondary | ICD-10-CM | POA: Diagnosis not present

## 2021-03-01 DIAGNOSIS — F32A Depression, unspecified: Secondary | ICD-10-CM | POA: Diagnosis not present

## 2021-03-01 DIAGNOSIS — I951 Orthostatic hypotension: Secondary | ICD-10-CM | POA: Diagnosis not present

## 2021-03-01 NOTE — Telephone Encounter (Signed)
Verbal orders given. Clair Gulling with [facility] also requesting speech therapy for communication and medical social worker for community resources to support wife. Please advise

## 2021-03-01 NOTE — Telephone Encounter (Signed)
Sure - ok for verbals -

## 2021-03-01 NOTE — Telephone Encounter (Signed)
Verbal orders given  

## 2021-03-04 DIAGNOSIS — N183 Chronic kidney disease, stage 3 unspecified: Secondary | ICD-10-CM | POA: Diagnosis not present

## 2021-03-04 DIAGNOSIS — F02B0 Dementia in other diseases classified elsewhere, moderate, without behavioral disturbance, psychotic disturbance, mood disturbance, and anxiety: Secondary | ICD-10-CM | POA: Diagnosis not present

## 2021-03-04 DIAGNOSIS — I951 Orthostatic hypotension: Secondary | ICD-10-CM | POA: Diagnosis not present

## 2021-03-04 DIAGNOSIS — F32A Depression, unspecified: Secondary | ICD-10-CM | POA: Diagnosis not present

## 2021-03-04 DIAGNOSIS — N401 Enlarged prostate with lower urinary tract symptoms: Secondary | ICD-10-CM | POA: Diagnosis not present

## 2021-03-04 DIAGNOSIS — N138 Other obstructive and reflux uropathy: Secondary | ICD-10-CM | POA: Diagnosis not present

## 2021-03-04 DIAGNOSIS — F482 Pseudobulbar affect: Secondary | ICD-10-CM | POA: Diagnosis not present

## 2021-03-04 DIAGNOSIS — G20 Parkinson's disease: Secondary | ICD-10-CM | POA: Diagnosis not present

## 2021-03-04 DIAGNOSIS — I25119 Atherosclerotic heart disease of native coronary artery with unspecified angina pectoris: Secondary | ICD-10-CM | POA: Diagnosis not present

## 2021-03-05 DIAGNOSIS — N1831 Chronic kidney disease, stage 3a: Secondary | ICD-10-CM | POA: Diagnosis not present

## 2021-03-05 DIAGNOSIS — Z79899 Other long term (current) drug therapy: Secondary | ICD-10-CM | POA: Diagnosis not present

## 2021-03-05 DIAGNOSIS — R269 Unspecified abnormalities of gait and mobility: Secondary | ICD-10-CM | POA: Diagnosis not present

## 2021-03-05 DIAGNOSIS — I951 Orthostatic hypotension: Secondary | ICD-10-CM | POA: Diagnosis not present

## 2021-03-05 DIAGNOSIS — R0989 Other specified symptoms and signs involving the circulatory and respiratory systems: Secondary | ICD-10-CM | POA: Diagnosis not present

## 2021-03-05 DIAGNOSIS — Z9181 History of falling: Secondary | ICD-10-CM | POA: Diagnosis not present

## 2021-03-05 DIAGNOSIS — R031 Nonspecific low blood-pressure reading: Secondary | ICD-10-CM | POA: Diagnosis not present

## 2021-03-07 DIAGNOSIS — N401 Enlarged prostate with lower urinary tract symptoms: Secondary | ICD-10-CM | POA: Diagnosis not present

## 2021-03-07 DIAGNOSIS — N183 Chronic kidney disease, stage 3 unspecified: Secondary | ICD-10-CM | POA: Diagnosis not present

## 2021-03-07 DIAGNOSIS — G20 Parkinson's disease: Secondary | ICD-10-CM | POA: Diagnosis not present

## 2021-03-07 DIAGNOSIS — N138 Other obstructive and reflux uropathy: Secondary | ICD-10-CM | POA: Diagnosis not present

## 2021-03-07 DIAGNOSIS — F02B0 Dementia in other diseases classified elsewhere, moderate, without behavioral disturbance, psychotic disturbance, mood disturbance, and anxiety: Secondary | ICD-10-CM | POA: Diagnosis not present

## 2021-03-07 DIAGNOSIS — F482 Pseudobulbar affect: Secondary | ICD-10-CM | POA: Diagnosis not present

## 2021-03-07 DIAGNOSIS — I951 Orthostatic hypotension: Secondary | ICD-10-CM | POA: Diagnosis not present

## 2021-03-07 DIAGNOSIS — I25119 Atherosclerotic heart disease of native coronary artery with unspecified angina pectoris: Secondary | ICD-10-CM | POA: Diagnosis not present

## 2021-03-07 DIAGNOSIS — F32A Depression, unspecified: Secondary | ICD-10-CM | POA: Diagnosis not present

## 2021-03-08 ENCOUNTER — Telehealth: Payer: Self-pay | Admitting: Internal Medicine

## 2021-03-08 DIAGNOSIS — R269 Unspecified abnormalities of gait and mobility: Secondary | ICD-10-CM

## 2021-03-08 DIAGNOSIS — N401 Enlarged prostate with lower urinary tract symptoms: Secondary | ICD-10-CM | POA: Diagnosis not present

## 2021-03-08 DIAGNOSIS — N138 Other obstructive and reflux uropathy: Secondary | ICD-10-CM | POA: Diagnosis not present

## 2021-03-08 DIAGNOSIS — F32A Depression, unspecified: Secondary | ICD-10-CM | POA: Diagnosis not present

## 2021-03-08 DIAGNOSIS — G20 Parkinson's disease: Secondary | ICD-10-CM | POA: Diagnosis not present

## 2021-03-08 DIAGNOSIS — F482 Pseudobulbar affect: Secondary | ICD-10-CM | POA: Diagnosis not present

## 2021-03-08 DIAGNOSIS — I25119 Atherosclerotic heart disease of native coronary artery with unspecified angina pectoris: Secondary | ICD-10-CM | POA: Diagnosis not present

## 2021-03-08 DIAGNOSIS — F02B0 Dementia in other diseases classified elsewhere, moderate, without behavioral disturbance, psychotic disturbance, mood disturbance, and anxiety: Secondary | ICD-10-CM | POA: Diagnosis not present

## 2021-03-08 DIAGNOSIS — I951 Orthostatic hypotension: Secondary | ICD-10-CM | POA: Diagnosis not present

## 2021-03-08 DIAGNOSIS — N183 Chronic kidney disease, stage 3 unspecified: Secondary | ICD-10-CM | POA: Diagnosis not present

## 2021-03-08 DIAGNOSIS — R296 Repeated falls: Secondary | ICD-10-CM

## 2021-03-08 NOTE — Telephone Encounter (Signed)
John Herring from [facility] calling in  [place] she wanted to make provider aware of changes patient has been experiencing  Says patient wife has noticed increased confusion.. increased hallucinations.Marland Kitchen as well as patient is getting out of bed more frequently during the night.. patient tried to get out of bed last night & fell (11/17)  Callback is needed [phone]

## 2021-03-08 NOTE — Telephone Encounter (Signed)
Ok to let wife/family know -   I can refer pt to "[facility] " - an outpt physical therapy in [place] that specializes in Parkinsons if they like

## 2021-03-11 ENCOUNTER — Telehealth: Payer: Self-pay

## 2021-03-11 DIAGNOSIS — N138 Other obstructive and reflux uropathy: Secondary | ICD-10-CM | POA: Diagnosis not present

## 2021-03-11 DIAGNOSIS — I25119 Atherosclerotic heart disease of native coronary artery with unspecified angina pectoris: Secondary | ICD-10-CM | POA: Diagnosis not present

## 2021-03-11 DIAGNOSIS — F02B0 Dementia in other diseases classified elsewhere, moderate, without behavioral disturbance, psychotic disturbance, mood disturbance, and anxiety: Secondary | ICD-10-CM | POA: Diagnosis not present

## 2021-03-11 DIAGNOSIS — R269 Unspecified abnormalities of gait and mobility: Secondary | ICD-10-CM

## 2021-03-11 DIAGNOSIS — F32A Depression, unspecified: Secondary | ICD-10-CM | POA: Diagnosis not present

## 2021-03-11 DIAGNOSIS — I951 Orthostatic hypotension: Secondary | ICD-10-CM | POA: Diagnosis not present

## 2021-03-11 DIAGNOSIS — N401 Enlarged prostate with lower urinary tract symptoms: Secondary | ICD-10-CM | POA: Diagnosis not present

## 2021-03-11 DIAGNOSIS — F482 Pseudobulbar affect: Secondary | ICD-10-CM | POA: Diagnosis not present

## 2021-03-11 DIAGNOSIS — G20 Parkinson's disease: Secondary | ICD-10-CM | POA: Diagnosis not present

## 2021-03-11 DIAGNOSIS — N183 Chronic kidney disease, stage 3 unspecified: Secondary | ICD-10-CM | POA: Diagnosis not present

## 2021-03-11 NOTE — Telephone Encounter (Signed)
Ok for verbal if that is ok 

## 2021-03-11 NOTE — Telephone Encounter (Signed)
See below

## 2021-03-11 NOTE — Telephone Encounter (Signed)
Ulice Bold from [facility] called and would like OT orders faxed for patient.   Fax number: [phone]

## 2021-03-11 NOTE — Telephone Encounter (Signed)
Ok this is done 

## 2021-03-11 NOTE — Telephone Encounter (Signed)
Patient's wife notified and would like to proceed with referral

## 2021-03-12 ENCOUNTER — Telehealth: Payer: Self-pay | Admitting: Internal Medicine

## 2021-03-12 DIAGNOSIS — F32A Depression, unspecified: Secondary | ICD-10-CM | POA: Diagnosis not present

## 2021-03-12 DIAGNOSIS — N183 Chronic kidney disease, stage 3 unspecified: Secondary | ICD-10-CM | POA: Diagnosis not present

## 2021-03-12 DIAGNOSIS — N138 Other obstructive and reflux uropathy: Secondary | ICD-10-CM | POA: Diagnosis not present

## 2021-03-12 DIAGNOSIS — F482 Pseudobulbar affect: Secondary | ICD-10-CM | POA: Diagnosis not present

## 2021-03-12 DIAGNOSIS — G20 Parkinson's disease: Secondary | ICD-10-CM

## 2021-03-12 DIAGNOSIS — F02B0 Dementia in other diseases classified elsewhere, moderate, without behavioral disturbance, psychotic disturbance, mood disturbance, and anxiety: Secondary | ICD-10-CM | POA: Diagnosis not present

## 2021-03-12 DIAGNOSIS — N401 Enlarged prostate with lower urinary tract symptoms: Secondary | ICD-10-CM | POA: Diagnosis not present

## 2021-03-12 DIAGNOSIS — I25119 Atherosclerotic heart disease of native coronary artery with unspecified angina pectoris: Secondary | ICD-10-CM | POA: Diagnosis not present

## 2021-03-12 DIAGNOSIS — R1310 Dysphagia, unspecified: Secondary | ICD-10-CM

## 2021-03-12 DIAGNOSIS — I951 Orthostatic hypotension: Secondary | ICD-10-CM | POA: Diagnosis not present

## 2021-03-12 NOTE — Telephone Encounter (Signed)
Tried calling [facility] to advise that Dr. Jenny Reichmann is out of the office. No answer, left message to call me back.

## 2021-03-12 NOTE — Telephone Encounter (Signed)
Deirdre Heber [place], Speech Patologist, from [facility] has called and is requesting an order to be faxed to complete a modified barium swallow study on pt. At [facility]. Requesting ASAP.    Please fax to: [phone] Callback #- [phone]

## 2021-03-12 NOTE — Telephone Encounter (Signed)
Caller Claiborne Billings patient's pt states patient fell of his toilet on 03-10-2021  Caller states patient has skin abrasions on his back, but no skin tears, patient was able to participate in his pt session  Caller states patient's nurse will make a home visit today

## 2021-03-12 NOTE — Telephone Encounter (Signed)
Per [facility], they need a written (faxed) order.

## 2021-03-17 NOTE — Telephone Encounter (Signed)
Ok order done hardcopy to you  Orders also done for pt/ot by EMR as well

## 2021-03-17 NOTE — Telephone Encounter (Signed)
Ok order is done by EMR to [facility] hopefully to hear soon

## 2021-03-18 ENCOUNTER — Other Ambulatory Visit (HOSPITAL_COMMUNITY): Payer: Self-pay

## 2021-03-18 DIAGNOSIS — N183 Chronic kidney disease, stage 3 unspecified: Secondary | ICD-10-CM | POA: Diagnosis not present

## 2021-03-18 DIAGNOSIS — F32A Depression, unspecified: Secondary | ICD-10-CM | POA: Diagnosis not present

## 2021-03-18 DIAGNOSIS — F02B0 Dementia in other diseases classified elsewhere, moderate, without behavioral disturbance, psychotic disturbance, mood disturbance, and anxiety: Secondary | ICD-10-CM | POA: Diagnosis not present

## 2021-03-18 DIAGNOSIS — R1310 Dysphagia, unspecified: Secondary | ICD-10-CM

## 2021-03-18 DIAGNOSIS — G20 Parkinson's disease: Secondary | ICD-10-CM | POA: Diagnosis not present

## 2021-03-18 DIAGNOSIS — N401 Enlarged prostate with lower urinary tract symptoms: Secondary | ICD-10-CM | POA: Diagnosis not present

## 2021-03-18 DIAGNOSIS — I25119 Atherosclerotic heart disease of native coronary artery with unspecified angina pectoris: Secondary | ICD-10-CM | POA: Diagnosis not present

## 2021-03-18 DIAGNOSIS — F482 Pseudobulbar affect: Secondary | ICD-10-CM | POA: Diagnosis not present

## 2021-03-18 DIAGNOSIS — N138 Other obstructive and reflux uropathy: Secondary | ICD-10-CM | POA: Diagnosis not present

## 2021-03-18 DIAGNOSIS — I951 Orthostatic hypotension: Secondary | ICD-10-CM | POA: Diagnosis not present

## 2021-03-18 NOTE — Telephone Encounter (Signed)
Orders faxed

## 2021-03-19 DIAGNOSIS — F02B0 Dementia in other diseases classified elsewhere, moderate, without behavioral disturbance, psychotic disturbance, mood disturbance, and anxiety: Secondary | ICD-10-CM | POA: Diagnosis not present

## 2021-03-19 DIAGNOSIS — I951 Orthostatic hypotension: Secondary | ICD-10-CM | POA: Diagnosis not present

## 2021-03-19 DIAGNOSIS — F482 Pseudobulbar affect: Secondary | ICD-10-CM | POA: Diagnosis not present

## 2021-03-19 DIAGNOSIS — G20 Parkinson's disease: Secondary | ICD-10-CM | POA: Diagnosis not present

## 2021-03-19 DIAGNOSIS — N138 Other obstructive and reflux uropathy: Secondary | ICD-10-CM | POA: Diagnosis not present

## 2021-03-19 DIAGNOSIS — N183 Chronic kidney disease, stage 3 unspecified: Secondary | ICD-10-CM | POA: Diagnosis not present

## 2021-03-19 DIAGNOSIS — N401 Enlarged prostate with lower urinary tract symptoms: Secondary | ICD-10-CM | POA: Diagnosis not present

## 2021-03-19 DIAGNOSIS — I25119 Atherosclerotic heart disease of native coronary artery with unspecified angina pectoris: Secondary | ICD-10-CM | POA: Diagnosis not present

## 2021-03-19 DIAGNOSIS — F32A Depression, unspecified: Secondary | ICD-10-CM | POA: Diagnosis not present

## 2021-03-20 ENCOUNTER — Telehealth: Payer: Self-pay | Admitting: Internal Medicine

## 2021-03-20 DIAGNOSIS — G20 Parkinson's disease: Secondary | ICD-10-CM | POA: Diagnosis not present

## 2021-03-20 DIAGNOSIS — N138 Other obstructive and reflux uropathy: Secondary | ICD-10-CM | POA: Diagnosis not present

## 2021-03-20 DIAGNOSIS — F02B0 Dementia in other diseases classified elsewhere, moderate, without behavioral disturbance, psychotic disturbance, mood disturbance, and anxiety: Secondary | ICD-10-CM | POA: Diagnosis not present

## 2021-03-20 DIAGNOSIS — F32A Depression, unspecified: Secondary | ICD-10-CM | POA: Diagnosis not present

## 2021-03-20 DIAGNOSIS — F482 Pseudobulbar affect: Secondary | ICD-10-CM | POA: Diagnosis not present

## 2021-03-20 DIAGNOSIS — I951 Orthostatic hypotension: Secondary | ICD-10-CM | POA: Diagnosis not present

## 2021-03-20 DIAGNOSIS — I25119 Atherosclerotic heart disease of native coronary artery with unspecified angina pectoris: Secondary | ICD-10-CM | POA: Diagnosis not present

## 2021-03-20 DIAGNOSIS — N401 Enlarged prostate with lower urinary tract symptoms: Secondary | ICD-10-CM | POA: Diagnosis not present

## 2021-03-20 DIAGNOSIS — N183 Chronic kidney disease, stage 3 unspecified: Secondary | ICD-10-CM | POA: Diagnosis not present

## 2021-03-20 DIAGNOSIS — R8290 Unspecified abnormal findings in urine: Secondary | ICD-10-CM

## 2021-03-20 NOTE — Telephone Encounter (Signed)
Patient is continuing to have a foul smelling urine according to wife  Low blood pressure reading,   Repeat urinalysis or culture  [facility] [phone]

## 2021-03-20 NOTE — Telephone Encounter (Signed)
Ok I ordered urine testing to be done at the [facility] lab if that is ok (or ok for verbal to be done in the home per home health if better that way)

## 2021-03-21 DIAGNOSIS — F02B0 Dementia in other diseases classified elsewhere, moderate, without behavioral disturbance, psychotic disturbance, mood disturbance, and anxiety: Secondary | ICD-10-CM | POA: Diagnosis not present

## 2021-03-21 DIAGNOSIS — N401 Enlarged prostate with lower urinary tract symptoms: Secondary | ICD-10-CM | POA: Diagnosis not present

## 2021-03-21 DIAGNOSIS — F482 Pseudobulbar affect: Secondary | ICD-10-CM | POA: Diagnosis not present

## 2021-03-21 DIAGNOSIS — I25119 Atherosclerotic heart disease of native coronary artery with unspecified angina pectoris: Secondary | ICD-10-CM | POA: Diagnosis not present

## 2021-03-21 DIAGNOSIS — N183 Chronic kidney disease, stage 3 unspecified: Secondary | ICD-10-CM | POA: Diagnosis not present

## 2021-03-21 DIAGNOSIS — F32A Depression, unspecified: Secondary | ICD-10-CM | POA: Diagnosis not present

## 2021-03-21 DIAGNOSIS — I951 Orthostatic hypotension: Secondary | ICD-10-CM | POA: Diagnosis not present

## 2021-03-21 DIAGNOSIS — N138 Other obstructive and reflux uropathy: Secondary | ICD-10-CM | POA: Diagnosis not present

## 2021-03-21 DIAGNOSIS — G20 Parkinson's disease: Secondary | ICD-10-CM | POA: Diagnosis not present

## 2021-03-21 NOTE — Telephone Encounter (Signed)
Left message for John Herring to give me a call back

## 2021-03-22 ENCOUNTER — Ambulatory Visit (HOSPITAL_COMMUNITY)
Admission: RE | Admit: 2021-03-22 | Discharge: 2021-03-22 | Disposition: A | Discharge status: Home / Self Care | Payer: Medicare PPO | Source: Ambulatory Visit | Attending: Physician Assistant | Admitting: Physician Assistant

## 2021-03-22 ENCOUNTER — Other Ambulatory Visit: Payer: Self-pay

## 2021-03-22 DIAGNOSIS — R1310 Dysphagia, unspecified: Secondary | ICD-10-CM | POA: Diagnosis not present

## 2021-03-22 DIAGNOSIS — G20 Parkinson's disease: Secondary | ICD-10-CM

## 2021-03-22 DIAGNOSIS — R1312 Dysphagia, oropharyngeal phase: Secondary | ICD-10-CM | POA: Insufficient documentation

## 2021-03-22 NOTE — Progress Notes (Signed)
Modified Barium Swallow Progress Note  Patient Details  Name: John Herring MRN: [id number] Date of Birth: 04-08-1951  Today's Date: 03/22/2021  Modified Barium Swallow completed.  Full report located under Chart Review in the Imaging Section.  Brief recommendations include the following:  Clinical Impression  Pt has an oral more than pharyngeal dysphagia. Oral phase is consistent with h/o parkinson's disease with lingual pumping and reduced lingual propulsion. With solids, mastication is more of a munching pattern and he has increased oral residue, but he does use a second swallow to clear many of these residuals. A liquid wash further helps. Premature spillage occurs occasionally with thin liquids but only results in aspiration before the swallow x2: once at the end of several sequential straw sips and once when trying to take the pill with a liquid wash. A cued cough was only partially effective at clearing his airway. A very small outpouching was noted around the UES that did not seem to interfere with function or collect any significant residuals. Will defer ultimate plan to [facility] SLP but would consider diet up to regular solids and thin liquids by cup and meds whole in puree. After test was completed and education was being provided, pt's wife mentioned that pt had been instructed to only drink via straw by his cardiologist. Encouraged her to reach out to MD for clarification if he can avoid straws to reduce aspiration risk. If MD wants him to use a straw, would likely need to exercise increased precaution by means of small, single sips and intermittent coughing to try to expel potential aspirates as much as possible.   Swallow Evaluation Recommendations       SLP Diet Recommendations: Regular solids;Thin liquid (defer diet recommendation ultimately to primary SLP)   Liquid Administration via: Cup;No straw   Medication Administration: Whole meds with puree   Supervision: Patient able to  self feed;Full supervision/cueing for compensatory strategies   Compensations: Slow rate;Small sips/bites   Postural Changes: Seated upright at 90 degrees   Oral Care Recommendations: Oral care BID        Osie Bond., M.A. [facility] Pager [phone] Office [phone]  03/22/2021,4:31 PM

## 2021-03-25 ENCOUNTER — Telehealth: Payer: Self-pay | Admitting: Internal Medicine

## 2021-03-25 DIAGNOSIS — F02B0 Dementia in other diseases classified elsewhere, moderate, without behavioral disturbance, psychotic disturbance, mood disturbance, and anxiety: Secondary | ICD-10-CM | POA: Diagnosis not present

## 2021-03-25 DIAGNOSIS — N138 Other obstructive and reflux uropathy: Secondary | ICD-10-CM | POA: Diagnosis not present

## 2021-03-25 DIAGNOSIS — F32A Depression, unspecified: Secondary | ICD-10-CM | POA: Diagnosis not present

## 2021-03-25 DIAGNOSIS — I25119 Atherosclerotic heart disease of native coronary artery with unspecified angina pectoris: Secondary | ICD-10-CM | POA: Diagnosis not present

## 2021-03-25 DIAGNOSIS — F482 Pseudobulbar affect: Secondary | ICD-10-CM | POA: Diagnosis not present

## 2021-03-25 DIAGNOSIS — N183 Chronic kidney disease, stage 3 unspecified: Secondary | ICD-10-CM | POA: Diagnosis not present

## 2021-03-25 DIAGNOSIS — G20 Parkinson's disease: Secondary | ICD-10-CM | POA: Diagnosis not present

## 2021-03-25 DIAGNOSIS — N401 Enlarged prostate with lower urinary tract symptoms: Secondary | ICD-10-CM | POA: Diagnosis not present

## 2021-03-25 DIAGNOSIS — I951 Orthostatic hypotension: Secondary | ICD-10-CM | POA: Diagnosis not present

## 2021-03-25 NOTE — Telephone Encounter (Signed)
Beth returned call to [employer]. Message relayed to her from provider. States if pt is still having symptoms when she sees him, she will collect sample and take to lab.     Callback #- [phone]

## 2021-03-25 NOTE — Telephone Encounter (Signed)
Beth from [facility] called in stating that the patients wife would be coming by on Wednesday to drop off a urine sample.

## 2021-03-27 ENCOUNTER — Other Ambulatory Visit: Payer: Self-pay

## 2021-03-27 ENCOUNTER — Other Ambulatory Visit (INDEPENDENT_AMBULATORY_CARE_PROVIDER_SITE_OTHER): Payer: Medicare PPO

## 2021-03-27 ENCOUNTER — Encounter: Payer: Self-pay | Admitting: Internal Medicine

## 2021-03-27 ENCOUNTER — Other Ambulatory Visit: Payer: Self-pay | Admitting: Internal Medicine

## 2021-03-27 DIAGNOSIS — R8290 Unspecified abnormal findings in urine: Secondary | ICD-10-CM | POA: Diagnosis not present

## 2021-03-27 DIAGNOSIS — N390 Urinary tract infection, site not specified: Secondary | ICD-10-CM

## 2021-03-27 LAB — URINALYSIS, ROUTINE W REFLEX MICROSCOPIC
Bilirubin Urine: NEGATIVE
Ketones, ur: NEGATIVE
Nitrite: NEGATIVE
Specific Gravity, Urine: 1.02 (ref 1.000–1.030)
Urine Glucose: NEGATIVE
Urobilinogen, UA: 0.2 (ref 0.0–1.0)
pH: 6 (ref 5.0–8.0)

## 2021-03-27 MED ORDER — CIPROFLOXACIN HCL 500 MG PO TABS: 500.0000 mg | ORAL_TABLET | Freq: Two times a day (BID) | ORAL | 0 refills | Status: AC

## 2021-03-28 DIAGNOSIS — F02B0 Dementia in other diseases classified elsewhere, moderate, without behavioral disturbance, psychotic disturbance, mood disturbance, and anxiety: Secondary | ICD-10-CM | POA: Diagnosis not present

## 2021-03-28 DIAGNOSIS — I25119 Atherosclerotic heart disease of native coronary artery with unspecified angina pectoris: Secondary | ICD-10-CM | POA: Diagnosis not present

## 2021-03-28 DIAGNOSIS — N401 Enlarged prostate with lower urinary tract symptoms: Secondary | ICD-10-CM | POA: Diagnosis not present

## 2021-03-28 DIAGNOSIS — F482 Pseudobulbar affect: Secondary | ICD-10-CM | POA: Diagnosis not present

## 2021-03-28 DIAGNOSIS — G20 Parkinson's disease: Secondary | ICD-10-CM | POA: Diagnosis not present

## 2021-03-28 DIAGNOSIS — I951 Orthostatic hypotension: Secondary | ICD-10-CM | POA: Diagnosis not present

## 2021-03-28 DIAGNOSIS — F32A Depression, unspecified: Secondary | ICD-10-CM | POA: Diagnosis not present

## 2021-03-28 DIAGNOSIS — N138 Other obstructive and reflux uropathy: Secondary | ICD-10-CM | POA: Diagnosis not present

## 2021-03-28 DIAGNOSIS — N183 Chronic kidney disease, stage 3 unspecified: Secondary | ICD-10-CM | POA: Diagnosis not present

## 2021-03-29 ENCOUNTER — Encounter: Payer: Self-pay | Admitting: Internal Medicine

## 2021-03-29 LAB — URINE CULTURE

## 2021-04-01 DIAGNOSIS — L821 Other seborrheic keratosis: Secondary | ICD-10-CM | POA: Diagnosis not present

## 2021-04-01 DIAGNOSIS — C44519 Basal cell carcinoma of skin of other part of trunk: Secondary | ICD-10-CM | POA: Diagnosis not present

## 2021-04-01 DIAGNOSIS — L570 Actinic keratosis: Secondary | ICD-10-CM | POA: Diagnosis not present

## 2021-04-01 DIAGNOSIS — D485 Neoplasm of uncertain behavior of skin: Secondary | ICD-10-CM | POA: Diagnosis not present

## 2021-04-01 DIAGNOSIS — Z85828 Personal history of other malignant neoplasm of skin: Secondary | ICD-10-CM | POA: Diagnosis not present

## 2021-04-08 DIAGNOSIS — G20 Parkinson's disease: Secondary | ICD-10-CM | POA: Diagnosis not present

## 2021-04-08 DIAGNOSIS — R29898 Other symptoms and signs involving the musculoskeletal system: Secondary | ICD-10-CM | POA: Diagnosis not present

## 2021-04-08 DIAGNOSIS — Z79899 Other long term (current) drug therapy: Secondary | ICD-10-CM | POA: Diagnosis not present

## 2021-04-08 DIAGNOSIS — F02B2 Dementia in other diseases classified elsewhere, moderate, with psychotic disturbance: Secondary | ICD-10-CM | POA: Diagnosis not present

## 2021-04-08 DIAGNOSIS — Z993 Dependence on wheelchair: Secondary | ICD-10-CM | POA: Diagnosis not present

## 2021-04-08 DIAGNOSIS — G3183 Dementia with Lewy bodies: Secondary | ICD-10-CM | POA: Diagnosis not present

## 2021-04-08 DIAGNOSIS — I951 Orthostatic hypotension: Secondary | ICD-10-CM | POA: Diagnosis not present

## 2021-04-08 DIAGNOSIS — R443 Hallucinations, unspecified: Secondary | ICD-10-CM | POA: Diagnosis not present

## 2021-04-10 ENCOUNTER — Telehealth: Payer: Self-pay | Admitting: Internal Medicine

## 2021-04-10 ENCOUNTER — Other Ambulatory Visit: Payer: Self-pay

## 2021-04-10 ENCOUNTER — Encounter: Payer: Self-pay | Admitting: Internal Medicine

## 2021-04-10 ENCOUNTER — Other Ambulatory Visit (INDEPENDENT_AMBULATORY_CARE_PROVIDER_SITE_OTHER): Payer: Medicare PPO

## 2021-04-10 DIAGNOSIS — R8290 Unspecified abnormal findings in urine: Secondary | ICD-10-CM | POA: Diagnosis not present

## 2021-04-10 LAB — URINALYSIS, ROUTINE W REFLEX MICROSCOPIC
Bilirubin Urine: NEGATIVE
Hgb urine dipstick: NEGATIVE
Ketones, ur: NEGATIVE
Leukocytes,Ua: NEGATIVE
Nitrite: NEGATIVE
RBC / HPF: NONE SEEN (ref 0–?)
Specific Gravity, Urine: 1.02 (ref 1.000–1.030)
Urine Glucose: NEGATIVE
Urobilinogen, UA: 0.2 (ref 0.0–1.0)
pH: 5.5 (ref 5.0–8.0)

## 2021-04-10 NOTE — Telephone Encounter (Signed)
Advised patient's wife okay to bring specimen. Can you place odor?

## 2021-04-10 NOTE — Telephone Encounter (Signed)
[facility] for order - done

## 2021-04-10 NOTE — Telephone Encounter (Signed)
Beth called and states she saw pt on yesterday. Urine has foul odor. Wanting to know if pt wife can bring urine sample to [place]. She has collected sample today, and wants to bring it in ASAP.   Please advise.

## 2021-04-12 ENCOUNTER — Encounter: Payer: Self-pay | Admitting: Internal Medicine

## 2021-04-12 ENCOUNTER — Other Ambulatory Visit: Payer: Self-pay | Admitting: Internal Medicine

## 2021-04-12 LAB — URINE CULTURE

## 2021-04-12 MED ORDER — NITROFURANTOIN MONOHYD MACRO 100 MG PO CAPS: 100.0000 mg | ORAL_CAPSULE | Freq: Two times a day (BID) | ORAL | 0 refills | Status: DC

## 2021-04-17 ENCOUNTER — Other Ambulatory Visit: Payer: Self-pay | Admitting: Cardiology

## 2021-04-18 ENCOUNTER — Telehealth: Payer: Self-pay

## 2021-04-18 NOTE — Telephone Encounter (Signed)
Beth called again regarding pt.   States pt spouse reports that pt fell over the weekend on Saturday, was in shower. Soap on ground, no injuries.

## 2021-04-18 NOTE — Telephone Encounter (Signed)
Ok noted,  I dont have further recommendations for now

## 2021-04-18 NOTE — Telephone Encounter (Signed)
Beth last day seeing pt.  B/P is still going down when standing.    108/70 sitting 79/46 standing  Wife is following instructing per Cardiology.  Few more days on antibiotic for his urine. Odor is still strong.

## 2021-04-18 NOTE — Telephone Encounter (Addendum)
Beth with [facility] notified.

## 2021-04-22 ENCOUNTER — Encounter: Payer: Self-pay | Admitting: Internal Medicine

## 2021-04-22 DIAGNOSIS — N390 Urinary tract infection, site not specified: Secondary | ICD-10-CM

## 2021-04-25 DIAGNOSIS — N390 Urinary tract infection, site not specified: Secondary | ICD-10-CM | POA: Diagnosis not present

## 2021-04-26 ENCOUNTER — Telehealth: Payer: Self-pay | Admitting: Internal Medicine

## 2021-04-26 ENCOUNTER — Encounter: Payer: Self-pay | Admitting: Internal Medicine

## 2021-04-26 LAB — URINALYSIS, ROUTINE W REFLEX MICROSCOPIC
Bilirubin Urine: NEGATIVE
Hgb urine dipstick: NEGATIVE
Ketones, ur: NEGATIVE
Leukocytes,Ua: NEGATIVE
Nitrite: NEGATIVE
RBC / HPF: NONE SEEN (ref 0–?)
Specific Gravity, Urine: 1.025 (ref 1.000–1.030)
Total Protein, Urine: 30 — AB
Urine Glucose: NEGATIVE
Urobilinogen, UA: 0.2 (ref 0.0–1.0)
pH: 6 (ref 5.0–8.0)

## 2021-04-26 LAB — URINE CULTURE

## 2021-04-26 NOTE — Telephone Encounter (Signed)
Pt has discharged from [facility] services with referrlal to o/p PT.   Going to "[facility]" [phone]

## 2021-04-26 NOTE — Telephone Encounter (Signed)
Ok sounds good

## 2021-05-01 DIAGNOSIS — G20 Parkinson's disease: Secondary | ICD-10-CM | POA: Diagnosis not present

## 2021-05-01 DIAGNOSIS — R269 Unspecified abnormalities of gait and mobility: Secondary | ICD-10-CM | POA: Diagnosis not present

## 2021-05-02 DIAGNOSIS — R972 Elevated prostate specific antigen [PSA]: Secondary | ICD-10-CM | POA: Diagnosis not present

## 2021-05-03 ENCOUNTER — Telehealth: Payer: Self-pay | Admitting: Internal Medicine

## 2021-05-03 NOTE — Telephone Encounter (Signed)
Dan calling in  Says they are requesting a written order for speech therapy.. did not give any specifics or frequency  Please fax to [phone]

## 2021-05-03 NOTE — Telephone Encounter (Signed)
Ok done hardcopy to cma 

## 2021-05-03 NOTE — Telephone Encounter (Signed)
Spoke with [facility] and they state that the order is for vocal quality and volume. It should say "ST eval and treat for Parkinsons" with the ICD-10 code (G20)

## 2021-05-06 NOTE — Telephone Encounter (Signed)
Order has been faxed

## 2021-05-07 DIAGNOSIS — G20 Parkinson's disease: Secondary | ICD-10-CM | POA: Diagnosis not present

## 2021-05-07 DIAGNOSIS — R269 Unspecified abnormalities of gait and mobility: Secondary | ICD-10-CM | POA: Diagnosis not present

## 2021-05-09 DIAGNOSIS — R972 Elevated prostate specific antigen [PSA]: Secondary | ICD-10-CM | POA: Diagnosis not present

## 2021-05-09 DIAGNOSIS — N401 Enlarged prostate with lower urinary tract symptoms: Secondary | ICD-10-CM | POA: Diagnosis not present

## 2021-05-09 DIAGNOSIS — R350 Frequency of micturition: Secondary | ICD-10-CM | POA: Diagnosis not present

## 2021-05-09 DIAGNOSIS — R269 Unspecified abnormalities of gait and mobility: Secondary | ICD-10-CM | POA: Diagnosis not present

## 2021-05-09 DIAGNOSIS — G20 Parkinson's disease: Secondary | ICD-10-CM | POA: Diagnosis not present

## 2021-05-14 ENCOUNTER — Ambulatory Visit: Payer: Medicare PPO | Admitting: Cardiology

## 2021-05-14 ENCOUNTER — Other Ambulatory Visit: Payer: Self-pay

## 2021-05-14 ENCOUNTER — Encounter: Payer: Self-pay | Admitting: Cardiology

## 2021-05-14 VITALS — BP 80/60 | HR 81 | Ht 70.0 in | Wt 165.0 lb

## 2021-05-14 DIAGNOSIS — I2510 Atherosclerotic heart disease of native coronary artery without angina pectoris: Secondary | ICD-10-CM

## 2021-05-14 DIAGNOSIS — I951 Orthostatic hypotension: Secondary | ICD-10-CM

## 2021-05-14 DIAGNOSIS — E7800 Pure hypercholesterolemia, unspecified: Secondary | ICD-10-CM

## 2021-05-14 DIAGNOSIS — G20A1 Parkinson's disease without dyskinesia, without mention of fluctuations: Secondary | ICD-10-CM

## 2021-05-14 DIAGNOSIS — G20 Parkinson's disease: Secondary | ICD-10-CM | POA: Diagnosis not present

## 2021-05-14 NOTE — Assessment & Plan Note (Signed)
Low-dose Crestor 5 mg.  Excellent LDL 56.

## 2021-05-14 NOTE — Progress Notes (Signed)
Cardiology Office Note:    Date:  05/14/2021   ID:  John Herring, DOB 02-02-1951, MRN [id number]  PCP:  Biagio Borg, MD  Cardiologist:  Candee Furbish, MD  Electrophysiologist:  None   Referring MD: Biagio Borg, MD    History of Present Illness:    John Herring is a 71 y.o. male here for the follow-up of hyperlipidemia and hypotension.  He was initially seen for the evaluation of chest pain at the request of Dr. Langston Masker.  Has Parkinson's disease post deep brain stimulator with hypotension on midodrine, prior bilateral PEs approximately 2018 after long flight.  Previously woke up in late September with a chest pressure sensation worse at breakfast.  Blood pressure was 180/110 which was significantly elevated for him.  They gave him a full dose aspirin.  Pain was approximately a 3/10 in the emergency room.  Never had a pain like this before. Tightness. Tremors were worse with upper torso at the time since his battery had depleted on his deep brain stimulator.  Since then it has been replaced.  Does have some chronic low back pain, also noting SOB with vacuum of the house for instance, has to stop.  Pressure-like left side diffuse no radiation.  No jaw pain no left arm pain.  No nausea or vomiting.  No personal history of heart disease.  He does have a history of heart disease however on his mother side of the family.  He does have an early family history of MI in nearly all of his siblings and parents.  No smoking.  He was treated for the bilateral PE that was provoked by airplane flight for 6 months.  Media. This was stopped.  CT scan of the chest was performed on 01/13/2019 that showed no evidence of PE or aortic dissection.  He did have multiple subacute and chronic left-sided rib fractures.  I personally reviewed his CT scan and he does have a dense focal calcification at the LAD at the origin of the LAD after it bifurcates from the long left main artery.  Today: He is  accompanied by his wife who provides most of the history.   Generally he does well with ambulating. However, while on a vacation in [place] he suffered from a fall and had several rib fractures. He lost his balance after standing up too quickly. He then stumbled and fell backwards onto a rock.  In 11/2020 he became septic and was on IV antibiotics 3x a day while in rehab.  Last week Vesicare was discontinued.  He denies any palpitations, chest pain, or shortness of breath. No headaches, syncope, orthopnea, PND, lower extremity edema or exertional symptoms.   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   Memory loss 07/12/2012   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   Skin cancer 05/06/2011   Syncope    orthostatic syncope, no problems in tha last 3-4 months    Past Surgical History:  Procedure Laterality Date   BASAL CELL CARCINOMA EXCISION N/A 03/15/2018   Procedure: RE [place];  Surgeon:  Irene Limbo, MD;  Location: [place];  Service: Plastics;  Laterality: N/A;   CARDIAC CATHETERIZATION  02/28/2019   COLONOSCOPY  2013   deep brain stimulation     Parkinson's disease   LEFT HEART CATH AND CORONARY ANGIOGRAPHY N/A 02/28/2019   Procedure: LEFT HEART CATH AND CORONARY ANGIOGRAPHY;  Surgeon: Nelva Bush, MD;  Location: [place] [facility];  Service: Cardiovascular;  Laterality: N/A;   MASS EXCISION N/A 02/12/2018   Procedure: excision lesion back, layered closure 15 cm;  Surgeon: Irene Limbo, MD;  Location: [place];  Service: Plastics;  Laterality: N/A;   SKIN  SPLIT GRAFT Left 03/15/2018   Procedure: SKIN GRAFT FROM RIGHT OR LEFT THIGH;  Surgeon: Irene Limbo, MD;  Location: [place];  Service: Plastics;  Laterality: Left;   SUBTHALAMIC STIMULATOR BATTERY REPLACEMENT N/A 09/01/2012   Procedure: SUBTHALAMIC STIMULATOR BATTERY REPLACEMENT;  Surgeon: Erline Levine, MD;  Location: [place] [facility];  Service: Neurosurgery;  Laterality: N/A;  Deep Brain Stimulator battery change   SUBTHALAMIC STIMULATOR BATTERY REPLACEMENT N/A 07/19/2015   Procedure: Deep Brain stimulator battery replacement;  Surgeon: Erline Levine, MD;  Location: [place] [facility];  Service: Neurosurgery;  Laterality: N/A;  Deep Brain stimulator battery replacement   SUBTHALAMIC STIMULATOR BATTERY REPLACEMENT N/A 01/20/2019   Procedure: Deep brain stimulator battery change;  Surgeon: Erline Levine, MD;  Location: [place];  Service: Neurosurgery;  Laterality: N/A;  Deep brain stimulator battery change   TONSILLECTOMY     As a child    Current Medications: Current Meds  Medication Sig   amantadine (SYMMETREL) 100 MG capsule Take 100 mg by mouth 2 (two) times daily.    aspirin EC 81 MG tablet Take 1 tablet (81 mg total) by mouth daily.   carbidopa-levodopa (SINEMET IR) 25-100 MG tablet Take 1 tablet by mouth 6 (six) times daily. Taking 1 tablet every 2-3 hours daily   dextromethorphan (DELSYM) 30 MG/5ML liquid Take by mouth.   diclofenac (VOLTAREN) 50 MG EC tablet    ezetimibe (ZETIA) 10 MG tablet TAKE 1 TABLET BY MOUTH EVERY DAY   finasteride (PROSCAR) 5 MG tablet = 1 Tab, ORAL, QEvening, 0 Refill(s), Maintenance   midodrine (PROAMATINE) 5 MG tablet TAKE 1 TABLET BY MOUTH 3 TIMES DAILY WITH MEALS.   mirabegron ER (MYRBETRIQ) 50 MG TB24 tablet Take 50 mg by mouth daily.   rivastigmine (EXELON) 9.5 mg/24hr 1 Patch, TOP, DAILY, 0 Refill(s), Maintenance   rosuvastatin (CRESTOR) 5 MG tablet Take 1 tablet (5 mg total) by mouth daily. Please keep upcoming appt in January 2023 with Dr.  Marlou Porch before anymore refills. Thank you Final Attempt   solifenacin (VESICARE) 5 MG tablet = 1 Tab, ORAL, QBedtime, Maintenance     Allergies:   Statins   Social History   Socioeconomic History   Marital status: Married    Spouse name: Not on file   Number of children: 2   Years of education: 18   Highest education level: Not on file  Occupational History   Occupation: Disabled  Tobacco Use   Smoking status: Never   Smokeless tobacco: Never  Vaping Use   Vaping Use: Never used  Substance and Sexual Activity   Alcohol use: No    Comment: heavy drinker at times in the past. None for 5 years   Drug use: No   Sexual activity: Not on file  Other Topics Concern   Not on file  Social History Narrative  Lives with wife   Social Determinants of Health   Financial Resource Strain: Not on file  Food Insecurity: Not on file  Transportation Needs: Not on file  Physical Activity: Not on file  Stress: Not on file  Social Connections: Not on file     Family History: The patient's family history is negative for Colon cancer.  ROS:   Please see the history of present illness. (+) Fall (+) Imbalance All other systems are reviewed and negative.    EKGs/Labs/Other Studies Reviewed:    The following studies were reviewed today:  Echo 02/28/2019: Sonographer Comments: Technically difficult study due to poor echo  windows. Patient in slight right decubitus position.  IMPRESSIONS    1. Left ventricular ejection fraction, by visual estimation, is 55 to  60%. The left ventricle has normal function. There is moderately increased  left ventricular hypertrophy.   2. Asymmetric hypertrophy measuring 14 mm in basal septum (34mm in  posterior wall)   3. Global right ventricle has normal systolic function.The right  ventricular size is normal. No increase in right ventricular wall  thickness.   4. Left atrial size was normal.   5. Right atrial size was normal.   6. The mitral  valve is normal in structure. No evidence of mitral valve  regurgitation.   7. The tricuspid valve is normal in structure. Tricuspid valve  regurgitation is trivial.   8. The aortic valve is tricuspid. Aortic valve regurgitation is not  visualized. No evidence of aortic valve sclerosis or stenosis.   9. The pulmonic valve was not well visualized. Pulmonic valve  regurgitation is not visualized.  10. TR signal is inadequate for assessing pulmonary artery systolic  pressure.   Left Heart Cath 02/28/2019: Conclusions: Mild coronary plaquing.  No angiographically significant coronary artery disease to explain recent chest pain and fatigue. Low normal left ventricular filling pressure. Tortuous right subclavian artery limiting catheter manipulation.  Recommend alternative access should catheterization be needed in the future.   Recommendations: Primary prevention of coronary artery disease. Six hours of post catheterization hydration. Follow-up transthoracic echocardiogram. Likely discharge home later today if no post catheterization complications. Diagnostic Dominance: Right    CTA Chest 01/13/2019: FINDINGS: Cardiovascular: Contrast injection is sufficient to demonstrate satisfactory opacification of the pulmonary arteries to the segmental level. There is no pulmonary embolus. The main pulmonary artery is within normal limits for size. There is no CT evidence of acute right heart strain. The visualized aorta is normal. Heart size is normal, without pericardial effusion.   Mediastinum/Nodes:   --No mediastinal or hilar lymphadenopathy.   --No axillary lymphadenopathy.   --No supraclavicular lymphadenopathy.   --Normal thyroid gland.   --The esophagus is unremarkable   Lungs/Pleura: There is atelectasis at the lung bases. No pneumothorax. No significant pleural effusion. The trachea is unremarkable.   Upper Abdomen: No acute abnormality.   Musculoskeletal: There are  multiple subacute and chronic left-sided rib fractures. There is a subacute fracture involving the ninth rib posteriorly on the right. There may be an acute displaced fracture of the eleventh rib posteriorly on the right, however this is only partially visualized on this exam.   Review of the MIP images confirms the above findings.   IMPRESSION: 1. No evidence for pulmonary embolus or aortic dissection. 2. Multiple subacute and chronic left-sided rib fractures. There may be an acute displaced fracture of the posterior right eleventh rib posteriorly, however this is only partially visualized on this exam. There is a subacute fracture  involving the posterior ninth rib on the right. 3. Atelectasis at the lung bases.   12/05/2016-DVT positive, treated for 6 months, discovered in [place].  CT scan of chest personally reviewed as above, focal ostial LAD calcification  EKG:  EKG is personally reviewed and interpreted. 05/14/2021: Sinus rhythm. Rate 81 bpm. LAD, nonspecific ST/T wave changes 01/13/2019-sinus rhythm 81 bpm LVH personally reviewed  Recent Labs: 08/17/2020: ALT 4; BUN 46; Creatinine, Ser 3.04; Hemoglobin 11.6; Platelets 222.0; Potassium 4.4; Sodium 141; TSH 2.68   Recent Lipid Panel    Component Value Date/Time   CHOL 126 08/17/2020 1359   TRIG 123.0 08/17/2020 1359   HDL 45.20 08/17/2020 1359   CHOLHDL 3 08/17/2020 1359   VLDL 24.6 08/17/2020 1359   LDLCALC 56 08/17/2020 1359   LDLDIRECT 76.0 06/06/2019 1515    Physical Exam:    VS:  BP (!) 80/60 (BP Location: Left Arm, Patient Position: Sitting, Cuff Size: Normal)    Pulse 81    Ht 5\' 10"  (1.778 m)    Wt 165 lb (74.8 kg)    SpO2 98%    BMI 23.68 kg/m     Wt Readings from Last 3 Encounters:  05/14/21 165 lb (74.8 kg)  02/25/21 160 lb (72.6 kg)  08/17/20 160 lb (72.6 kg)     GEN:  Well nourished, well developed in no acute distress HEENT: Normal NECK: No JVD; No carotid bruits LYMPHATICS: No  lymphadenopathy CARDIAC: RRR, no murmurs, rubs, gallops RESPIRATORY:  Clear to auscultation without rales, wheezing or rhonchi  ABDOMEN: Soft, non-tender, non-distended MUSCULOSKELETAL:  No edema; No deformity  SKIN: Warm and dry NEUROLOGIC:  Alert and oriented x 3, parkinsonian symptoms noted PSYCHIATRIC:  Normal affect   ASSESSMENT:    1. Coronary artery calcification seen on CT scan   2. Coronary artery disease involving native coronary artery of native heart without angina pectoris   3. 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 Orthostatic hypotension   5. Pure hypercholesterolemia     PLAN:    In order of problems listed above:  Coronary artery disease involving native coronary artery of native heart without angina pectoris Has focal ostial LAD calcification on prior heart catheterization.  On low-dose aspirin 81 mg as well as low-dose Crestor 5 mg.  Prior LDL 56 excellent.  No myalgias.  Okay to continue with current prevention strategy.  No anginal symptoms no chest pain.  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  Thankfully his son is there.  He has recovered.  Still utilizing midodrine as well as fludrocortisone for blood pressure support.  Orthostatic hypotension Midodrine, fludrocortisone.  Monitoring blood pressures very closely at home.  Being very careful when transferring.  Utilizing a wheelchair today for precautionary measures.  His wife gave history today.  Hyperlipidemia Low-dose Crestor 5 mg.  Excellent LDL 56.  Follow-up: In 1 year.   Medication Adjustments/Labs and Tests Ordered: Current medicines are reviewed at length with the patient today.  Concerns regarding medicines are outlined above.   Orders Placed This Encounter  Procedures   EKG 12-Lead   No orders of  the defined types were placed in this encounter.  Patient Instructions  Medication Instructions:  The current medical regimen is effective;  continue present plan and medications.  *If you need a refill on your  cardiac medications before your next appointment, please call your pharmacy*  Follow-Up: At [facility], you and your health needs are our priority.  As part of our continuing mission to provide you with exceptional heart care, we have created designated Provider Care Teams.  These Care Teams include your primary Cardiologist (physician) and Advanced Practice Providers (APPs -  Physician Assistants and Nurse Practitioners) who all work together to provide you with the care you need, when you need it.  We recommend signing up for the patient portal called "MyChart".  Sign up information is provided on this After Visit Summary.  MyChart is used to connect with patients for Virtual Visits (Telemedicine).  Patients are able to view lab/test results, encounter notes, upcoming appointments, etc.  Non-urgent messages can be sent to your provider as well.   To learn more about what you can do with MyChart, go to [email or web address].    Your next appointment:   1 year(s)  The format for your next appointment:   In Person  Provider:   Candee Furbish, MD    Thank you for choosing [facility]!!      I,Mathew Stumpf,acting as a scribe for Candee Furbish, MD.,have documented all relevant documentation on the behalf of Candee Furbish, MD,as directed by  Candee Furbish, MD while in the presence of Candee Furbish, MD.  I, Candee Furbish, MD, have reviewed all documentation for this visit. The documentation on 05/14/21 for the exam, diagnosis, procedures, and orders are all accurate and complete.   Signed, Candee Furbish, MD  05/14/2021 9:58 AM    [facility]

## 2021-05-14 NOTE — Assessment & Plan Note (Signed)
Midodrine, fludrocortisone.  Monitoring blood pressures very closely at home.  Being very careful when transferring.  Utilizing a wheelchair today for precautionary measures.  His wife gave history today.

## 2021-05-14 NOTE — Assessment & Plan Note (Signed)
Had a pretty serious fall in [place] in September 2022 resulted in hitting a rock falling backwards ended up with severe complications from this, empyema, sepsis and was in a long stay in [place].  Thankfully his son is there.  He has recovered.  Still utilizing midodrine as well as fludrocortisone for blood pressure support.

## 2021-05-14 NOTE — Patient Instructions (Signed)
Medication Instructions:  The current medical regimen is effective;  continue present plan and medications.  *If you need a refill on your cardiac medications before your next appointment, please call your pharmacy*  Follow-Up: At [facility], you and your health needs are our priority.  As part of our continuing mission to provide you with exceptional heart care, we have created designated Provider Care Teams.  These Care Teams include your primary Cardiologist (physician) and Advanced Practice Providers (APPs -  Physician Assistants and Nurse Practitioners) who all work together to provide you with the care you need, when you need it.  We recommend signing up for the patient portal called "MyChart".  Sign up information is provided on this After Visit Summary.  MyChart is used to connect with patients for Virtual Visits (Telemedicine).  Patients are able to view lab/test results, encounter notes, upcoming appointments, etc.  Non-urgent messages can be sent to your provider as well.   To learn more about what you can do with MyChart, go to [email or web address].    Your next appointment:   1 year(s)  The format for your next appointment:   In Person  Provider:   Mark Skains, MD   Thank you for choosing [facility]!!    

## 2021-05-14 NOTE — Assessment & Plan Note (Signed)
Has focal ostial LAD calcification on prior heart catheterization.  On low-dose aspirin 81 mg as well as low-dose Crestor 5 mg.  Prior LDL 56 excellent.  No myalgias.  Okay to continue with current prevention strategy.  No anginal symptoms no chest pain.

## 2021-05-15 DIAGNOSIS — R269 Unspecified abnormalities of gait and mobility: Secondary | ICD-10-CM | POA: Diagnosis not present

## 2021-05-15 DIAGNOSIS — G20 Parkinson's disease: Secondary | ICD-10-CM | POA: Diagnosis not present

## 2021-05-17 DIAGNOSIS — R269 Unspecified abnormalities of gait and mobility: Secondary | ICD-10-CM | POA: Diagnosis not present

## 2021-05-17 DIAGNOSIS — G20 Parkinson's disease: Secondary | ICD-10-CM | POA: Diagnosis not present

## 2021-05-20 DIAGNOSIS — N184 Chronic kidney disease, stage 4 (severe): Secondary | ICD-10-CM | POA: Diagnosis not present

## 2021-05-22 DIAGNOSIS — G20 Parkinson's disease: Secondary | ICD-10-CM | POA: Diagnosis not present

## 2021-05-24 DIAGNOSIS — G20 Parkinson's disease: Secondary | ICD-10-CM | POA: Diagnosis not present

## 2021-05-26 ENCOUNTER — Other Ambulatory Visit: Payer: Self-pay | Admitting: Internal Medicine

## 2021-05-26 NOTE — Telephone Encounter (Signed)
Please refill as per office routine med refill policy (all routine meds to be refilled for 3 mo or monthly (per pt preference) up to one year from last visit, then month to month grace period for 3 mo, then further med refills will have to be denied) ? ?

## 2021-05-27 DIAGNOSIS — R269 Unspecified abnormalities of gait and mobility: Secondary | ICD-10-CM | POA: Diagnosis not present

## 2021-05-27 DIAGNOSIS — G20 Parkinson's disease: Secondary | ICD-10-CM | POA: Diagnosis not present

## 2021-05-29 ENCOUNTER — Other Ambulatory Visit: Payer: Self-pay

## 2021-05-29 ENCOUNTER — Ambulatory Visit: Payer: Medicare PPO | Admitting: Internal Medicine

## 2021-05-29 ENCOUNTER — Encounter: Payer: Self-pay | Admitting: Internal Medicine

## 2021-05-29 VITALS — BP 116/90 | HR 63 | Resp 18 | Ht 70.0 in | Wt 167.0 lb

## 2021-05-29 DIAGNOSIS — F03B0 Unspecified dementia, moderate, without behavioral disturbance, psychotic disturbance, mood disturbance, and anxiety: Secondary | ICD-10-CM

## 2021-05-29 DIAGNOSIS — R399 Unspecified symptoms and signs involving the genitourinary system: Secondary | ICD-10-CM | POA: Diagnosis not present

## 2021-05-29 DIAGNOSIS — E538 Deficiency of other specified B group vitamins: Secondary | ICD-10-CM | POA: Diagnosis not present

## 2021-05-29 DIAGNOSIS — R739 Hyperglycemia, unspecified: Secondary | ICD-10-CM | POA: Diagnosis not present

## 2021-05-29 DIAGNOSIS — E559 Vitamin D deficiency, unspecified: Secondary | ICD-10-CM

## 2021-05-29 DIAGNOSIS — G20 Parkinson's disease: Secondary | ICD-10-CM | POA: Diagnosis not present

## 2021-05-29 DIAGNOSIS — G903 Multi-system degeneration of the autonomic nervous system: Secondary | ICD-10-CM | POA: Diagnosis not present

## 2021-05-29 DIAGNOSIS — D509 Iron deficiency anemia, unspecified: Secondary | ICD-10-CM | POA: Diagnosis not present

## 2021-05-29 DIAGNOSIS — E7800 Pure hypercholesterolemia, unspecified: Secondary | ICD-10-CM

## 2021-05-29 DIAGNOSIS — N200 Calculus of kidney: Secondary | ICD-10-CM | POA: Diagnosis not present

## 2021-05-29 DIAGNOSIS — N184 Chronic kidney disease, stage 4 (severe): Secondary | ICD-10-CM | POA: Diagnosis not present

## 2021-05-29 DIAGNOSIS — N1832 Chronic kidney disease, stage 3b: Secondary | ICD-10-CM

## 2021-05-29 DIAGNOSIS — Z0001 Encounter for general adult medical examination with abnormal findings: Secondary | ICD-10-CM

## 2021-05-29 DIAGNOSIS — J869 Pyothorax without fistula: Secondary | ICD-10-CM | POA: Diagnosis not present

## 2021-05-29 LAB — URINALYSIS, ROUTINE W REFLEX MICROSCOPIC
Bilirubin Urine: NEGATIVE
Hgb urine dipstick: NEGATIVE
Ketones, ur: NEGATIVE
Leukocytes,Ua: NEGATIVE
Nitrite: NEGATIVE
RBC / HPF: NONE SEEN (ref 0–?)
Specific Gravity, Urine: 1.02 (ref 1.000–1.030)
Urine Glucose: NEGATIVE
Urobilinogen, UA: 0.2 (ref 0.0–1.0)
pH: 6 (ref 5.0–8.0)

## 2021-05-29 NOTE — Progress Notes (Signed)
Patient ID: John Herring, male   DOB: May 06, 1950, 71 y.o.   MRN: [id number]         Chief Complaint:: wellness exam and low b12, hyperglycemia, ckd, hld, dementia       HPI:  John Herring is a 71 y.o. male here for wellness exam with wife present; dclines covid booster and shingrix, o/w up to date                        Also saw Dr peoples renal earlier today with cr 2.4, overall felt to be stable per wife report.  Pt denies chest pain, increased sob or doe, wheezing, orthopnea, PND, increased LE swelling, palpitations, dizziness or syncope.   Pt denies polydipsia, polyuria, or worsening or new focal neuro s/s.   Pt denies fever, wt loss, night sweats, loss of appetite, or other constitutional symptoms  [facility], and plans to start twice weekly PT this wk.  Has been seen already today with lab per renal. No other new complaints.  Dementia overall stable symptomatically, and not assoc with behavioral changes such as hallucinations, paranoia, or agitation.  Denies worsening depressive symptoms, suicidal ideation, or panic.  Has cardiology  f/u for BP next wk.     Wt Readings from Last 3 Encounters:  05/29/21 167 lb (75.8 kg)  05/14/21 165 lb (74.8 kg)  02/25/21 160 lb (72.6 kg)   BP Readings from Last 3 Encounters:  05/29/21 116/90  05/14/21 (!) 80/60  02/25/21 122/70   Immunization History  Administered Date(s) Administered   Fluad Quad(high Dose 65+) 01/05/2019, 02/07/2020   Influenza,inj,Quad PF,6+ Mos 02/09/2015   Influenza-Unspecified 12/20/2012, 01/31/2017, 01/02/2018   PFIZER(Purple Top)SARS-COV-2 Vaccination 05/10/2019, 05/31/2019, 01/17/2020   Pneumococcal Conjugate-13 08/08/2019   Pneumococcal Polysaccharide-23 08/17/2020   Td 06/09/2008   Tdap 08/08/2019   There are no preventive care reminders to display for this patient.     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   Skin cancer 05/06/2011   Syncope    orthostatic syncope, no problems in tha last 3-4 months   Past Surgical History:  Procedure Laterality Date   BASAL CELL CARCINOMA EXCISION N/A 03/15/2018   Procedure: RE [place];  Surgeon: Irene Limbo, MD;  Location: [place];  Service: Plastics;  Laterality: N/A;   CARDIAC CATHETERIZATION  02/28/2019   COLONOSCOPY  2013   deep brain stimulation     Parkinson's disease   LEFT HEART CATH AND CORONARY ANGIOGRAPHY N/A 02/28/2019   Procedure: LEFT HEART CATH AND CORONARY ANGIOGRAPHY;  Surgeon: Nelva Bush, MD;  Location: [place] [facility];  Service: Cardiovascular;  Laterality: N/A;   MASS EXCISION N/A 02/12/2018   Procedure: excision lesion back, layered closure 15 cm;  Surgeon: Irene Limbo, MD;  Location: [place];  Service: Plastics;  Laterality: N/A;   SKIN SPLIT GRAFT Left 03/15/2018   Procedure: SKIN GRAFT FROM RIGHT OR LEFT THIGH;  Surgeon: Irene Limbo, MD;  Location: [place];  Service: Plastics;  Laterality: Left;   SUBTHALAMIC STIMULATOR BATTERY REPLACEMENT N/A 09/01/2012   Procedure: SUBTHALAMIC STIMULATOR BATTERY REPLACEMENT;  Surgeon: Erline Levine, MD;  Location: [place] [facility];  Service: Neurosurgery;  Laterality: N/A;  Deep Brain Stimulator battery change   SUBTHALAMIC STIMULATOR BATTERY REPLACEMENT N/A 07/19/2015   Procedure: Deep Brain stimulator battery replacement;  Surgeon: Erline Levine, MD;  Location: [place] [facility];  Service: Neurosurgery;  Laterality: N/A;  Deep Brain stimulator battery  replacement   SUBTHALAMIC STIMULATOR BATTERY REPLACEMENT N/A 01/20/2019   Procedure: Deep brain stimulator battery change;  Surgeon: Erline Levine, MD;  Location: [place];  Service: Neurosurgery;  Laterality: N/A;  Deep brain stimulator battery change   TONSILLECTOMY     As a child    reports that he has never smoked. He has never used smokeless tobacco. He reports that he does not drink alcohol and does not use drugs. family history is not on file. Allergies  Allergen Reactions   Statins     Makes parkinson worse, blood pressure drop   Current Outpatient Medications on File Prior to Visit  Medication Sig Dispense Refill   amantadine (SYMMETREL) 100 MG capsule Take 100 mg by mouth 2 (two) times daily.      aspirin EC 81 MG tablet Take 1 tablet (81 mg total) by mouth daily. 90 tablet 3   carbidopa-levodopa (SINEMET IR) 25-100 MG tablet Take 1 tablet by mouth 6 (six) times daily. Taking 1 tablet every 2-3 hours daily     Cholecalciferol (VITAMIN D3) 50 MCG (2000 UT) TABS Take 50 mcg by mouth daily.     dextromethorphan (DELSYM) 30 MG/5ML liquid Take by mouth.     diclofenac (VOLTAREN) 50 MG EC tablet      ezetimibe (ZETIA) 10 MG tablet TAKE 1 TABLET BY MOUTH EVERY DAY 90 tablet 0   finasteride (PROSCAR) 5 MG tablet = 1 Tab, ORAL, QEvening, 0 Refill(s), Maintenance     fludrocortisone (FLORINEF) 0.1 MG tablet Take by mouth daily.     midodrine (PROAMATINE) 5 MG tablet TAKE 1 TABLET BY MOUTH 3 TIMES DAILY WITH MEALS. 270 tablet 3   mirabegron ER (MYRBETRIQ) 50 MG TB24 tablet Take 50 mg by mouth daily.     rivastigmine (EXELON) 9.5 mg/24hr 1 Patch, TOP, DAILY, 0 Refill(s), Maintenance     rosuvastatin (CRESTOR) 5 MG tablet Take 1 tablet (5 mg total) by mouth daily. Please keep upcoming appt in January 2023 with Dr. Marlou Porch before anymore refills. Thank you Final Attempt 30 tablet 0   No current facility-administered medications on file prior to visit.        ROS:  All others reviewed and  negative.  Objective        PE:  BP 116/90    Pulse 63    Resp 18    Ht 5\' 10"  (1.778 m)    Wt 167 lb (75.8 kg)    SpO2 99%    BMI 23.96 kg/m                 Constitutional: Pt appears in NAD               HENT: Head: NCAT.                Right Ear: External ear normal.                 Left Ear: External ear normal.  Eyes: . Pupils are equal, round, and reactive to light. Conjunctivae and EOM are normal               Nose: without d/c or deformity               Neck: Neck supple. Gross normal ROM               Cardiovascular: Normal rate and regular rhythm.                 Pulmonary/Chest: Effort normal and breath sounds without rales or wheezing.                Abd:  Soft, NT, ND, + BS, no organomegaly               Neurological: Pt is alert. At baseline orientation, motor grossly intact with PD movements               Skin: Skin is warm. No rashes, no other new lesions, LE edema - none               Psychiatric: Pt behavior is normal without agitation   Micro: none  Cardiac tracings I have personally interpreted today:  none  Pertinent Radiological findings (summarize): none   Lab Results  Component Value Date   WBC 8.8 08/17/2020   HGB 11.6 (L) 08/17/2020   HCT 33.6 (L) 08/17/2020   PLT 222.0 08/17/2020   GLUCOSE 88 08/17/2020   CHOL 126 08/17/2020   TRIG 123.0 08/17/2020   HDL 45.20 08/17/2020   LDLDIRECT 76.0 06/06/2019   LDLCALC 56 08/17/2020   ALT 4 08/17/2020   AST 13 08/17/2020   NA 141 08/17/2020   K 4.4 08/17/2020   CL 107 08/17/2020   CREATININE 3.04 (H) 08/17/2020   BUN 46 (H) 08/17/2020   CO2 26 08/17/2020   TSH 2.68 08/17/2020   PSA 3.29 08/17/2020   HGBA1C 5.6 08/17/2020   Assessment/Plan:  John Herring is a 71 y.o. 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  Vitamin D deficiency Last vitamin D Lab Results  Component Value Date   VD25OH 41.74 08/17/2020   Stable, cont oral replacement   Encounter for well adult exam with abnormal findings Age and sex appropriate education and counseling updated with regular exercise and diet Referrals for preventative services - none needed Immunizations addressed - declines covid booster, shingrix Smoking counseling  - none needed Evidence for depression or other mood disorder - none significant Most recent labs reviewed. I have personally reviewed and have noted: 1) the patient's medical and social history 2) The patient's current medications and supplements 3) The patient's height, weight, and BMI have been recorded in the chart   Dementia ([place]) Overall stable, cont current med tx   - exelon  B12 deficiency Lab Results  Component Value Date   VITAMINB12 209 (L) 08/17/2020   Low, to start oral replacement - b12 1000 mcg qd   CKD (chronic kidney disease) stage 3, GFR 30-59 ml/min Lab Results  Component Value Date   CREATININE 3.04 (H) 08/17/2020   Stable overall, cont to avoid nephrotoxins, cont f/u renal as planned -  most recent Cr 2.4 per wife   Hyperglycemia Lab Results  Component Value Date   HGBA1C 5.6 08/17/2020   Stable, pt to continue current medical treatment  - diet   Hyperlipidemia Lab Results  Component Value Date   LDLCALC 56 08/17/2020   Stable, pt to continue current statin crestor 5  Followup: Return in about 6 months (around 11/26/2021).  Cathlean Cower, MD 05/29/2021 2:56 PM [facility]

## 2021-05-29 NOTE — Assessment & Plan Note (Signed)
Last vitamin D Lab Results  Component Value Date   VD25OH 41.74 08/17/2020   Stable, cont oral replacement  

## 2021-05-29 NOTE — Addendum Note (Signed)
Addended by: Biagio Borg on: 05/29/2021 03:20 PM   Modules accepted: Orders

## 2021-05-29 NOTE — Assessment & Plan Note (Signed)
Lab Results  Component Value Date   HGBA1C 5.6 08/17/2020   Stable, pt to continue current medical treatment  - diet

## 2021-05-29 NOTE — Patient Instructions (Signed)
Please continue all other medications as before, and refills have been done if requested.  Please have the pharmacy call with any other refills you may need.  Please continue your efforts at being more active, low cholesterol diet, and weight control.  Please keep your appointments with your specialists as you may have planned  We can plan on lab testing next time  Please make an Appointment to return in 6 months, or sooner if needed

## 2021-05-29 NOTE — Assessment & Plan Note (Signed)
Lab Results  Component Value Date   CREATININE 3.04 (H) 08/17/2020   Stable overall, cont to avoid nephrotoxins, cont f/u renal as planned - most recent Cr 2.4 per wife

## 2021-05-29 NOTE — Assessment & Plan Note (Signed)
Overall stable, cont current med tx   - exelon

## 2021-05-29 NOTE — Assessment & Plan Note (Signed)
Lab Results  Component Value Date   LDLCALC 56 08/17/2020   Stable, pt to continue current statin crestor 5

## 2021-05-29 NOTE — Assessment & Plan Note (Signed)
Lab Results  Component Value Date   VITAMINB12 209 (L) 08/17/2020   Low, to start oral replacement - b12 1000 mcg qd

## 2021-05-29 NOTE — Assessment & Plan Note (Signed)

## 2021-05-31 DIAGNOSIS — G20 Parkinson's disease: Secondary | ICD-10-CM | POA: Diagnosis not present

## 2021-06-05 DIAGNOSIS — R55 Syncope and collapse: Secondary | ICD-10-CM | POA: Diagnosis not present

## 2021-06-05 DIAGNOSIS — R0989 Other specified symptoms and signs involving the circulatory and respiratory systems: Secondary | ICD-10-CM | POA: Diagnosis not present

## 2021-06-05 DIAGNOSIS — N1831 Chronic kidney disease, stage 3a: Secondary | ICD-10-CM | POA: Diagnosis not present

## 2021-06-05 DIAGNOSIS — R269 Unspecified abnormalities of gait and mobility: Secondary | ICD-10-CM | POA: Diagnosis not present

## 2021-06-05 DIAGNOSIS — G20 Parkinson's disease: Secondary | ICD-10-CM | POA: Diagnosis not present

## 2021-06-05 DIAGNOSIS — G249 Dystonia, unspecified: Secondary | ICD-10-CM | POA: Diagnosis not present

## 2021-06-07 DIAGNOSIS — G20 Parkinson's disease: Secondary | ICD-10-CM | POA: Diagnosis not present

## 2021-06-12 DIAGNOSIS — G20 Parkinson's disease: Secondary | ICD-10-CM | POA: Diagnosis not present

## 2021-06-14 DIAGNOSIS — G20 Parkinson's disease: Secondary | ICD-10-CM | POA: Diagnosis not present

## 2021-06-17 DIAGNOSIS — G20 Parkinson's disease: Secondary | ICD-10-CM | POA: Diagnosis not present

## 2021-06-19 DIAGNOSIS — G20 Parkinson's disease: Secondary | ICD-10-CM | POA: Diagnosis not present

## 2021-06-19 DIAGNOSIS — Z4542 Encounter for adjustment and management of neuropacemaker (brain) (peripheral nerve) (spinal cord): Secondary | ICD-10-CM | POA: Diagnosis not present

## 2021-06-19 DIAGNOSIS — Z9689 Presence of other specified functional implants: Secondary | ICD-10-CM | POA: Diagnosis not present

## 2021-06-19 DIAGNOSIS — Z9682 Presence of neurostimulator: Secondary | ICD-10-CM | POA: Diagnosis not present

## 2021-06-21 DIAGNOSIS — G20 Parkinson's disease: Secondary | ICD-10-CM | POA: Diagnosis not present

## 2021-06-21 DIAGNOSIS — R269 Unspecified abnormalities of gait and mobility: Secondary | ICD-10-CM | POA: Diagnosis not present

## 2021-06-24 DIAGNOSIS — R269 Unspecified abnormalities of gait and mobility: Secondary | ICD-10-CM | POA: Diagnosis not present

## 2021-06-24 DIAGNOSIS — G20 Parkinson's disease: Secondary | ICD-10-CM | POA: Diagnosis not present

## 2021-06-28 DIAGNOSIS — G20 Parkinson's disease: Secondary | ICD-10-CM | POA: Diagnosis not present

## 2021-06-28 DIAGNOSIS — R269 Unspecified abnormalities of gait and mobility: Secondary | ICD-10-CM | POA: Diagnosis not present

## 2021-07-01 ENCOUNTER — Encounter: Payer: Self-pay | Admitting: Internal Medicine

## 2021-07-01 DIAGNOSIS — G20 Parkinson's disease: Secondary | ICD-10-CM | POA: Diagnosis not present

## 2021-07-01 DIAGNOSIS — N390 Urinary tract infection, site not specified: Secondary | ICD-10-CM

## 2021-07-01 DIAGNOSIS — R269 Unspecified abnormalities of gait and mobility: Secondary | ICD-10-CM | POA: Diagnosis not present

## 2021-07-02 ENCOUNTER — Other Ambulatory Visit: Payer: Self-pay

## 2021-07-02 ENCOUNTER — Other Ambulatory Visit (INDEPENDENT_AMBULATORY_CARE_PROVIDER_SITE_OTHER): Payer: Medicare PPO

## 2021-07-02 DIAGNOSIS — N390 Urinary tract infection, site not specified: Secondary | ICD-10-CM | POA: Diagnosis not present

## 2021-07-02 LAB — URINALYSIS, ROUTINE W REFLEX MICROSCOPIC
Bilirubin Urine: NEGATIVE
Hgb urine dipstick: NEGATIVE
Ketones, ur: NEGATIVE
Leukocytes,Ua: NEGATIVE
Nitrite: NEGATIVE
RBC / HPF: NONE SEEN (ref 0–?)
Specific Gravity, Urine: 1.02 (ref 1.000–1.030)
Urine Glucose: NEGATIVE
Urobilinogen, UA: 0.2 (ref 0.0–1.0)
pH: 6 (ref 5.0–8.0)

## 2021-07-03 DIAGNOSIS — G20 Parkinson's disease: Secondary | ICD-10-CM | POA: Diagnosis not present

## 2021-07-03 LAB — URINE CULTURE

## 2021-07-10 DIAGNOSIS — R269 Unspecified abnormalities of gait and mobility: Secondary | ICD-10-CM | POA: Diagnosis not present

## 2021-07-10 DIAGNOSIS — G20 Parkinson's disease: Secondary | ICD-10-CM | POA: Diagnosis not present

## 2021-07-12 DIAGNOSIS — R269 Unspecified abnormalities of gait and mobility: Secondary | ICD-10-CM | POA: Diagnosis not present

## 2021-07-12 DIAGNOSIS — G20 Parkinson's disease: Secondary | ICD-10-CM | POA: Diagnosis not present

## 2021-07-15 DIAGNOSIS — G20 Parkinson's disease: Secondary | ICD-10-CM | POA: Diagnosis not present

## 2021-07-15 DIAGNOSIS — R269 Unspecified abnormalities of gait and mobility: Secondary | ICD-10-CM | POA: Diagnosis not present

## 2021-07-19 DIAGNOSIS — G20 Parkinson's disease: Secondary | ICD-10-CM | POA: Diagnosis not present

## 2021-07-19 DIAGNOSIS — R269 Unspecified abnormalities of gait and mobility: Secondary | ICD-10-CM | POA: Diagnosis not present

## 2021-07-24 DIAGNOSIS — G20 Parkinson's disease: Secondary | ICD-10-CM | POA: Diagnosis not present

## 2021-07-24 DIAGNOSIS — R269 Unspecified abnormalities of gait and mobility: Secondary | ICD-10-CM | POA: Diagnosis not present

## 2021-07-26 DIAGNOSIS — R269 Unspecified abnormalities of gait and mobility: Secondary | ICD-10-CM | POA: Diagnosis not present

## 2021-07-26 DIAGNOSIS — G20 Parkinson's disease: Secondary | ICD-10-CM | POA: Diagnosis not present

## 2021-07-29 DIAGNOSIS — R269 Unspecified abnormalities of gait and mobility: Secondary | ICD-10-CM | POA: Diagnosis not present

## 2021-07-29 DIAGNOSIS — G20 Parkinson's disease: Secondary | ICD-10-CM | POA: Diagnosis not present

## 2021-07-31 DIAGNOSIS — R269 Unspecified abnormalities of gait and mobility: Secondary | ICD-10-CM | POA: Diagnosis not present

## 2021-07-31 DIAGNOSIS — G20 Parkinson's disease: Secondary | ICD-10-CM | POA: Diagnosis not present

## 2021-08-02 ENCOUNTER — Ambulatory Visit: Payer: Medicare PPO

## 2021-08-04 ENCOUNTER — Other Ambulatory Visit: Payer: Self-pay | Admitting: Cardiology

## 2021-08-05 ENCOUNTER — Ambulatory Visit (INDEPENDENT_AMBULATORY_CARE_PROVIDER_SITE_OTHER): Payer: Medicare PPO

## 2021-08-05 DIAGNOSIS — Z0000 Encounter for general adult medical examination without abnormal findings: Secondary | ICD-10-CM

## 2021-08-05 DIAGNOSIS — G20 Parkinson's disease: Secondary | ICD-10-CM | POA: Diagnosis not present

## 2021-08-05 DIAGNOSIS — R269 Unspecified abnormalities of gait and mobility: Secondary | ICD-10-CM | POA: Diagnosis not present

## 2021-08-05 NOTE — Patient Instructions (Signed)
John Herring , ?Thank you for taking time to come for your Medicare Wellness Visit. I appreciate your ongoing commitment to your health goals. Please review the following plan we discussed and let me know if I can assist you in the future.  ? ?Screening recommendations/referrals: ?Colonoscopy: 01/09/2012; due every 10 years ?Recommended yearly ophthalmology/optometry visit for glaucoma screening and checkup ?Recommended yearly dental visit for hygiene and checkup ? ?Vaccinations: ?Influenza vaccine: 02/05/2021 ?Pneumococcal vaccine: 08/08/2019, 08/17/2020 ?Tdap vaccine: 08/08/2019; due every 10 years ?Shingles vaccine: never done ?Zoster vaccine: 05/03/2001   ?Covid-19: 05/10/2019, 05/31/2019, 01/17/2020, 09/06/2020 ? ?Advanced directives: Yes; Please bring a copy of your health care power of attorney and living will to the office at your convenience. ? ?Conditions/risks identified: Yes ? ?Next appointment: Please schedule your next Medicare Wellness Visit with your Nurse Health Advisor in 1 year by calling [phone]. ? ?Preventive Care 70 Years and Older, Male ?Preventive care refers to lifestyle choices and visits with your health care provider that can promote health and wellness. ?What does preventive care include? ?A yearly physical exam. This is also called an annual well check. ?Dental exams once or twice a year. ?Routine eye exams. Ask your health care provider how often you should have your eyes checked. ?Personal lifestyle choices, including: ?Daily care of your teeth and gums. ?Regular physical activity. ?Eating a healthy diet. ?Avoiding tobacco and drug use. ?Limiting alcohol use. ?Practicing safe sex. ?Taking low doses of aspirin every day. ?Taking vitamin and mineral supplements as recommended by your health care provider. ?What happens during an annual well check? ?The services and screenings done by your health care provider during your annual well check will depend on your age, overall health, lifestyle risk  factors, and family history of disease. ?Counseling  ?Your health care provider may ask you questions about your: ?Alcohol use. ?Tobacco use. ?Drug use. ?Emotional well-being. ?Home and relationship well-being. ?Sexual activity. ?Eating habits. ?History of falls. ?Memory and ability to understand (cognition). ?Work and work Statistician. ?Screening  ?You may have the following tests or measurements: ?Height, weight, and BMI. ?Blood pressure. ?Lipid and cholesterol levels. These may be checked every 5 years, or more frequently if you are over 45 years old. ?Skin check. ?Lung cancer screening. You may have this screening every year starting at age 38 if you have a 30-pack-year history of smoking and currently smoke or have quit within the past 15 years. ?Fecal occult blood test (FOBT) of the stool. You may have this test every year starting at age 3. ?Flexible sigmoidoscopy or colonoscopy. You may have a sigmoidoscopy every 5 years or a colonoscopy every 10 years starting at age 20. ?Prostate cancer screening. Recommendations will vary depending on your family history and other risks. ?Hepatitis C blood test. ?Hepatitis B blood test. ?Sexually transmitted disease (STD) testing. ?Diabetes screening. This is done by checking your blood sugar (glucose) after you have not eaten for a while (fasting). You may have this done every 1-3 years. ?Abdominal aortic aneurysm (AAA) screening. You may need this if you are a current or former smoker. ?Osteoporosis. You may be screened starting at age 93 if you are at high risk. ?Talk with your health care provider about your test results, treatment options, and if necessary, the need for more tests. ?Vaccines  ?Your health care provider may recommend certain vaccines, such as: ?Influenza vaccine. This is recommended every year. ?Tetanus, diphtheria, and acellular pertussis (Tdap, Td) vaccine. You may need a Td booster every 10 years. ?Zoster  vaccine. You may need this after age  32. ?Pneumococcal 13-valent conjugate (PCV13) vaccine. One dose is recommended after age 32. ?Pneumococcal polysaccharide (PPSV23) vaccine. One dose is recommended after age 66. ?Talk to your health care provider about which screenings and vaccines you need and how often you need them. ?This information is not intended to replace advice given to you by your health care provider. Make sure you discuss any questions you have with your health care provider. ?Document Released: 05/04/2015 Document Revised: 12/26/2015 Document Reviewed: 02/06/2015 ?Elsevier Interactive Patient Education ? 2017 [place]. ? ?Fall Prevention in the Home ?Falls can cause injuries. They can happen to people of all ages. There are many things you can do to make your home safe and to help prevent falls. ?What can I do on the outside of my home? ?Regularly fix the edges of walkways and driveways and fix any cracks. ?Remove anything that might make you trip as you walk through a door, such as a raised step or threshold. ?Trim any bushes or trees on the path to your home. ?Use bright outdoor lighting. ?Clear any walking paths of anything that might make someone trip, such as rocks or tools. ?Regularly check to see if handrails are loose or broken. Make sure that both sides of any steps have handrails. ?Any raised decks and porches should have guardrails on the edges. ?Have any leaves, snow, or ice cleared regularly. ?Use sand or salt on walking paths during winter. ?Clean up any spills in your garage right away. This includes oil or grease spills. ?What can I do in the bathroom? ?Use night lights. ?Install grab bars by the toilet and in the tub and shower. Do not use towel bars as grab bars. ?Use non-skid mats or decals in the tub or shower. ?If you need to sit down in the shower, use a plastic, non-slip stool. ?Keep the floor dry. Clean up any water that spills on the floor as soon as it happens. ?Remove soap buildup in the tub or shower  regularly. ?Attach bath mats securely with double-sided non-slip rug tape. ?Do not have throw rugs and other things on the floor that can make you trip. ?What can I do in the bedroom? ?Use night lights. ?Make sure that you have a light by your bed that is easy to reach. ?Do not use any sheets or blankets that are too big for your bed. They should not hang down onto the floor. ?Have a firm chair that has side arms. You can use this for support while you get dressed. ?Do not have throw rugs and other things on the floor that can make you trip. ?What can I do in the kitchen? ?Clean up any spills right away. ?Avoid walking on wet floors. ?Keep items that you use a lot in easy-to-reach places. ?If you need to reach something above you, use a strong step stool that has a grab bar. ?Keep electrical cords out of the way. ?Do not use floor polish or wax that makes floors slippery. If you must use wax, use non-skid floor wax. ?Do not have throw rugs and other things on the floor that can make you trip. ?What can I do with my stairs? ?Do not leave any items on the stairs. ?Make sure that there are handrails on both sides of the stairs and use them. Fix handrails that are broken or loose. Make sure that handrails are as long as the stairways. ?Check any carpeting to make sure that it  is firmly attached to the stairs. Fix any carpet that is loose or worn. ?Avoid having throw rugs at the top or bottom of the stairs. If you do have throw rugs, attach them to the floor with carpet tape. ?Make sure that you have a light switch at the top of the stairs and the bottom of the stairs. If you do not have them, ask someone to add them for you. ?What else can I do to help prevent falls? ?Wear shoes that: ?Do not have high heels. ?Have rubber bottoms. ?Are comfortable and fit you well. ?Are closed at the toe. Do not wear sandals. ?If you use a stepladder: ?Make sure that it is fully opened. Do not climb a closed stepladder. ?Make sure that  both sides of the stepladder are locked into place. ?Ask someone to hold it for you, if possible. ?Clearly mark and make sure that you can see: ?Any grab bars or handrails. ?First and last steps. ?Where the e

## 2021-08-05 NOTE — Progress Notes (Signed)
?I connected with John Herring today by telephone and verified that I am speaking with the correct person using two identifiers. ?Location patient: home ?Location provider: work ?Persons participating in the virtual visit: patient, provider. ?  ?I discussed the limitations, risks, security and privacy concerns of performing an evaluation and management service by telephone and the availability of in person appointments. I also discussed with the patient that there may be a patient responsible charge related to this service. The patient expressed understanding and verbally consented to this telephonic visit.  ?  ?Interactive audio and video telecommunications were attempted between this provider and patient, however failed, due to patient having technical difficulties OR patient did not have access to video capability.  We continued and completed visit with audio only. ? ?Some vital signs may be absent or patient reported.  ? ?Time Spent with patient on telephone encounter: 30 minutes ? ?Subjective:  ? John Herring is a 71 y.o. male who presents for an Initial Medicare Annual Wellness Visit. ? ?Review of Systems    ? ?Cardiac Risk Factors include: advanced age (>49mn, >>81women);dyslipidemia;male gender ? ?   ?Objective:  ?  ?Today's Vitals  ? 08/05/21 1401  ?PainSc: 8   ? ?There is no height or weight on file to calculate BMI. ? ? ?  08/05/2021  ?  1:53 PM 02/28/2019  ? 11:22 AM 01/20/2019  ?  3:14 PM 01/13/2019  ? 10:25 AM 03/15/2018  ? 11:50 AM 03/05/2018  ?  3:19 PM 02/12/2018  ? 10:19 AM  ?Advanced Directives  ?Does Patient Have a Medical Advance Directive? Yes Yes Yes Yes Yes Yes Yes  ?Type of Advance Directive Living will;Healthcare Power of [place] CityLiving will [place] will [place]Living will;Out of facility DNR (pink MOST or yellow form) [place]Living will  [place] will  ?Does  patient want to make changes to medical advance directive? No - Patient declined No - Patient declined No - Patient declined No - Patient declined No - Patient declined No - Patient declined No - Patient declined  ?Copy of [place] Chart? No - copy requested No - copy requested No - copy requested No - copy requested, Physician notified No - copy requested No - copy requested No - copy requested  ?Would patient like information on creating a medical advance directive?   No - Patient declined No - Patient declined     ? ? ?Current Medications (verified) ?Outpatient Encounter Medications as of 08/05/2021  ?Medication Sig  ? aspirin EC 81 MG tablet Take 1 tablet (81 mg total) by mouth daily.  ? carbidopa-levodopa (SINEMET IR) 25-100 MG tablet Take 1 tablet by mouth 6 (six) times daily. Taking 1 tablet every 2-3 hours daily  ? Cholecalciferol (VITAMIN D3) 50 MCG (2000 UT) TABS Take 50 mcg by mouth daily.  ? dextromethorphan (DELSYM) 30 MG/5ML liquid Take by mouth.  ? diclofenac (VOLTAREN) 50 MG EC tablet   ? ezetimibe (ZETIA) 10 MG tablet TAKE 1 TABLET BY MOUTH EVERY DAY  ? finasteride (PROSCAR) 5 MG tablet = 1 Tab, ORAL, QEvening, 0 Refill(s), Maintenance  ? fludrocortisone (FLORINEF) 0.1 MG tablet Take by mouth daily.  ? midodrine (PROAMATINE) 5 MG tablet TAKE 1 TABLET BY MOUTH 3 TIMES DAILY WITH MEALS.  ? mirabegron ER (MYRBETRIQ) 50 MG TB24 tablet Take 50 mg by mouth daily.  ? rivastigmine (EXELON) 9.5 mg/24hr 1 Patch,  TOP, DAILY, 0 Refill(s), Maintenance  ? rosuvastatin (CRESTOR) 5 MG tablet Take 1 tablet (5 mg total) by mouth daily. Please keep upcoming appt in January 2023 with Dr. Marlou Porch before anymore refills. Thank you Final Attempt  ? [DISCONTINUED] amantadine (SYMMETREL) 100 MG capsule Take 100 mg by mouth 2 (two) times daily.   ? ?No facility-administered encounter medications on file as of 08/05/2021.  ? ? ?Allergies (verified) ?Statins  ? ?History: ?Past Medical History:  ?Diagnosis  Date  ? Arthritis   ? CKD (chronic kidney disease), stage III ([place])   ? Depression 05/06/2011  ? Enlarged prostate   ? Former consumption of alcohol   ? GERD (gastroesophageal reflux disease) 12/02/2012  ? patient denies this dx   ? History of kidney stones   ? passed stone  ? Hyperlipidemia 05/06/2011  ? Hypersomnia 05/06/2011  ? Hypotension   ? 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 Memory loss 07/12/2012  ? 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 Skin cancer 05/06/2011  ? Syncope   ? orthostatic syncope, no problems in tha last 3-4 months  ? ?Past Surgical History:  ?Procedure Laterality Date  ? BASAL CELL CARCINOMA EXCISION N/A 03/15/2018  ? Procedure: RE EXCISION BACK LESION;  Surgeon: Irene Limbo, MD;  Location: [facility];  Service: Plastics;  Laterality: N/A;  ? CARDIAC CATHETERIZATION  02/28/2019  ? COLONOSCOPY  2013  ? deep brain stimulation    ? Parkinson's disease  ? LEFT HEART CATH AND CORONARY ANGIOGRAPHY N/A 02/28/2019  ? Procedure: LEFT HEART CATH AND CORONARY ANGIOGRAPHY;  Surgeon: Nelva Bush, MD;  Location: [facility];  Service: Cardiovascular;  Laterality: N/A;  ? MASS EXCISION N/A 02/12/2018  ? Procedure: excision lesion back, layered closure 15 cm;  Surgeon: Irene Limbo, MD;  Location: [facility];  Service: Plastics;  Laterality: N/A;  ? SKIN SPLIT GRAFT Left 03/15/2018  ? Procedure: SKIN GRAFT FROM RIGHT OR LEFT THIGH;  Surgeon: Irene Limbo, MD;  Location: [facility];  Service: Plastics;  Laterality: Left;  ? SUBTHALAMIC STIMULATOR BATTERY REPLACEMENT N/A 09/01/2012  ? Procedure: SUBTHALAMIC STIMULATOR BATTERY REPLACEMENT;  Surgeon: Erline Levine, MD;  Location: [facility];  Service: Neurosurgery;  Laterality: N/A;  Deep Brain Stimulator battery change  ? SUBTHALAMIC STIMULATOR BATTERY REPLACEMENT N/A 07/19/2015  ? Procedure: Deep Brain stimulator battery replacement;  Surgeon: Erline Levine, MD;  Location: [facility];   Service: Neurosurgery;  Laterality: N/A;  Deep Brain stimulator battery replacement  ? SUBTHALAMIC STIMULATOR BATTERY REPLACEMENT N/A 01/20/2019  ? Procedure: Deep brain stimulator battery change;  Surgeon: Erline Levine, MD;  Location: [facility];  Service: Neurosurgery;  Laterality: N/A;  Deep brain stimulator battery change  ? TONSILLECTOMY    ? As a child  ? ?Family History  ?Problem Relation Age of Onset  ? Colon cancer Neg Hx   ? ?Social History  ? ?Socioeconomic History  ? Marital status: Married  ?  Spouse name: Not on file  ? Number of children: 2  ? Years of education: 97  ? Highest education level: Not on file  ?Occupational History  ? Occupation: Disabled  ?Tobacco Use  ? Smoking status: Never  ? Smokeless tobacco: Never  ?Vaping Use  ? Vaping Use: Never used  ?Substance and Sexual Activity  ? Alcohol use: No  ?  Comment: heavy drinker at times in the past. None for 5 years  ? Drug use: No  ? Sexual activity: Not on file  ?  Other Topics Concern  ? Not on file  ?Social History Narrative  ? Lives with wife  ? ?Social Determinants of Health  ? ?Financial Resource Strain: Low Risk   ? Difficulty of Paying Living Expenses: Not hard at all  ?Food Insecurity: No Food Insecurity  ? Worried About Charity fundraiser in the Last Year: Never true  ? Ran Out of Food in the Last Year: Never true  ?Transportation Needs: No Transportation Needs  ? Lack of Transportation (Medical): No  ? Lack of Transportation (Non-Medical): No  ?Physical Activity: Inactive  ? Days of Exercise per Week: 0 days  ? Minutes of Exercise per Session: 0 min  ?Stress: No Stress Concern Present  ? Feeling of Stress : Not at all  ?Social Connections: Socially Integrated  ? Frequency of Communication with Friends and Family: More than three times a week  ? Frequency of Social Gatherings with Friends and Family: More than three times a week  ? Attends Religious Services: 1 to 4 times per year  ? Active Member of Clubs or Organizations: No  ? Attends English as a second language teacher Meetings: 1 to 4 times per year  ? Marital Status: Married  ? ? ?Tobacco Counseling ?Counseling given: Not Answered ? ? ?Clinical Intake: ? ?Pre-visit preparation completed: Yes ? ?Pain :

## 2021-08-07 DIAGNOSIS — R269 Unspecified abnormalities of gait and mobility: Secondary | ICD-10-CM | POA: Diagnosis not present

## 2021-08-07 DIAGNOSIS — G20 Parkinson's disease: Secondary | ICD-10-CM | POA: Diagnosis not present

## 2021-08-12 ENCOUNTER — Encounter (HOSPITAL_COMMUNITY): Payer: Self-pay

## 2021-08-12 ENCOUNTER — Emergency Department (HOSPITAL_COMMUNITY)
Admission: EM | Admit: 2021-08-12 | Discharge: 2021-08-13 | Disposition: A | Discharge status: Home / Self Care | Payer: Medicare PPO | Attending: Emergency Medicine | Admitting: Emergency Medicine

## 2021-08-12 ENCOUNTER — Other Ambulatory Visit: Payer: Self-pay

## 2021-08-12 ENCOUNTER — Emergency Department (HOSPITAL_COMMUNITY): Payer: Medicare PPO

## 2021-08-12 DIAGNOSIS — S2242XA Multiple fractures of ribs, left side, initial encounter for closed fracture: Secondary | ICD-10-CM | POA: Insufficient documentation

## 2021-08-12 DIAGNOSIS — W19XXXA Unspecified fall, initial encounter: Secondary | ICD-10-CM

## 2021-08-12 DIAGNOSIS — Z9181 History of falling: Secondary | ICD-10-CM | POA: Diagnosis not present

## 2021-08-12 DIAGNOSIS — J439 Emphysema, unspecified: Secondary | ICD-10-CM | POA: Diagnosis not present

## 2021-08-12 DIAGNOSIS — Z7982 Long term (current) use of aspirin: Secondary | ICD-10-CM | POA: Insufficient documentation

## 2021-08-12 DIAGNOSIS — W01198A Fall on same level from slipping, tripping and stumbling with subsequent striking against other object, initial encounter: Secondary | ICD-10-CM | POA: Diagnosis not present

## 2021-08-12 DIAGNOSIS — I959 Hypotension, unspecified: Secondary | ICD-10-CM | POA: Diagnosis not present

## 2021-08-12 DIAGNOSIS — G20 Parkinson's disease: Secondary | ICD-10-CM | POA: Insufficient documentation

## 2021-08-12 DIAGNOSIS — R109 Unspecified abdominal pain: Secondary | ICD-10-CM | POA: Diagnosis not present

## 2021-08-12 DIAGNOSIS — S2241XA Multiple fractures of ribs, right side, initial encounter for closed fracture: Secondary | ICD-10-CM | POA: Diagnosis not present

## 2021-08-12 DIAGNOSIS — R296 Repeated falls: Secondary | ICD-10-CM | POA: Diagnosis present

## 2021-08-12 DIAGNOSIS — Z043 Encounter for examination and observation following other accident: Secondary | ICD-10-CM | POA: Diagnosis not present

## 2021-08-12 DIAGNOSIS — S3991XA Unspecified injury of abdomen, initial encounter: Secondary | ICD-10-CM | POA: Diagnosis not present

## 2021-08-12 DIAGNOSIS — S299XXA Unspecified injury of thorax, initial encounter: Secondary | ICD-10-CM | POA: Diagnosis present

## 2021-08-12 LAB — COMPREHENSIVE METABOLIC PANEL
ALT: 5 U/L (ref 0–44)
AST: 19 U/L (ref 15–41)
Albumin: 3.8 g/dL (ref 3.5–5.0)
Alkaline Phosphatase: 113 U/L (ref 38–126)
Anion gap: 10 (ref 5–15)
BUN: 54 mg/dL — ABNORMAL HIGH (ref 8–23)
CO2: 22 mmol/L (ref 22–32)
Calcium: 8.6 mg/dL — ABNORMAL LOW (ref 8.9–10.3)
Chloride: 104 mmol/L (ref 98–111)
Creatinine, Ser: 2.98 mg/dL — ABNORMAL HIGH (ref 0.61–1.24)
GFR, Estimated: 22 mL/min — ABNORMAL LOW (ref >60)
Glucose, Bld: 115 mg/dL — ABNORMAL HIGH (ref 70–99)
Potassium: 4.9 mmol/L (ref 3.5–5.1)
Sodium: 136 mmol/L (ref 135–145)
Total Bilirubin: 0.4 mg/dL (ref 0.3–1.2)
Total Protein: 6.3 g/dL — ABNORMAL LOW (ref 6.5–8.1)

## 2021-08-12 LAB — CBC WITH DIFFERENTIAL/PLATELET
Abs Immature Granulocytes: 0.03 10*3/uL (ref 0.00–0.07)
Basophils Absolute: 0.1 10*3/uL (ref 0.0–0.1)
Basophils Relative: 1 %
Eosinophils Absolute: 0.3 10*3/uL (ref 0.0–0.5)
Eosinophils Relative: 4 %
HCT: 32 % — ABNORMAL LOW (ref 39.0–52.0)
Hemoglobin: 10.3 g/dL — ABNORMAL LOW (ref 13.0–17.0)
Immature Granulocytes: 0 %
Lymphocytes Relative: 26 %
Lymphs Abs: 2 10*3/uL (ref 0.7–4.0)
MCH: 31.9 pg (ref 26.0–34.0)
MCHC: 32.2 g/dL (ref 30.0–36.0)
MCV: 99.1 fL (ref 80.0–100.0)
Monocytes Absolute: 0.8 10*3/uL (ref 0.1–1.0)
Monocytes Relative: 10 %
Neutro Abs: 4.4 10*3/uL (ref 1.7–7.7)
Neutrophils Relative %: 59 %
Platelets: 217 10*3/uL (ref 150–400)
RBC: 3.23 MIL/uL — ABNORMAL LOW (ref 4.22–5.81)
RDW: 15.6 % — ABNORMAL HIGH (ref 11.5–15.5)
WBC: 7.6 10*3/uL (ref 4.0–10.5)
nRBC: 0 % (ref 0.0–0.2)

## 2021-08-12 MED ORDER — LACTATED RINGERS IV BOLUS
1000.0000 mL | Freq: Once | INTRAVENOUS | Status: AC
Start: 2021-08-12 — End: 2021-08-13
  Administered 2021-08-12: 1000 mL via INTRAVENOUS

## 2021-08-12 NOTE — Discharge Instructions (Addendum)
You have been evaluated for fall. ? ?Your work-up showed-the rib fractures.  Take pain medication and use your incentive spirometer 4-6 times daily to prevent complications such as pneumonia.  If you have worsening pain, increasing shortness of breath, fevers, you should be reevaluated ? ?Please come back to the emergency department for worsening symptoms, blurry vision, problems with gait, new onset urinary incontinence or fecal incontinence. ? ?Thank you! ?

## 2021-08-12 NOTE — ED Provider Notes (Signed)
?[facility] ?Provider Note ? ? ?CSN: [id number] ?Arrival date & time: 08/12/21  2106 ? ?  ? ?History ? ?Chief Complaint  ?Patient presents with  ? Fall  ? Hypotension  ? ? ?John Herring is a 71 y.o. male. ? ? ?Fall ? ?71 year old male with multiple medical history including Parkinson, history of brain stimulator, orthostatic hypotension on Florinef and Midodrine who presents to the emergency department after mechanical fall.  Patient was found to be hypotensive in triage in the 97W systolic.  He was rushed to the room immediately. ? ?Per his wife, patient crossed his leg while he was walking.  He fell on his left flank hitting sharp edged dresser.  Denies head injury or loss of consciousness.  Denies any syncopal episode.  She reported he does have history of multiple fall.  Denies being on blood thinner.  Patient currently complains about left flank and mid abdominal pain.  Denies shortness of breath or chest pain.  His wife report has been compliant with his medication.  No report of fever, cough, congestion, diarrhea, or vomiting.  Patient was at his baseline prior to the incident. Otherwise no other complaints. ? ? ?Home Medications ?Prior to Admission medications   ?Medication Sig Start Date End Date Taking? Authorizing Provider  ?aspirin EC 81 MG tablet Take 1 tablet (81 mg total) by mouth daily. 02/21/19   Jerline Pain, MD  ?carbidopa-levodopa (SINEMET IR) 25-100 MG tablet Take 1 tablet by mouth 6 (six) times daily. Taking 1 tablet every 2-3 hours daily    [provider]  ?Cholecalciferol (VITAMIN D3) 50 MCG (2000 UT) TABS Take 50 mcg by mouth daily.    [provider]  ?dextromethorphan (DELSYM) 30 MG/5ML liquid Take by mouth.    [provider]  ?diclofenac (VOLTAREN) 50 MG EC tablet  01/19/20   [provider]  ?ezetimibe (ZETIA) 10 MG tablet TAKE 1 TABLET BY MOUTH EVERY DAY 05/27/21   Biagio Borg, MD  ?finasteride (PROSCAR) 5 MG  tablet = 1 Tab, ORAL, QEvening, 0 Refill(s), Maintenance 05/13/20   [provider]  ?fludrocortisone (FLORINEF) 0.1 MG tablet Take by mouth daily. 05/18/21   [provider]  ?midodrine (PROAMATINE) 5 MG tablet TAKE 1 TABLET BY MOUTH 3 TIMES DAILY WITH MEALS. 09/09/20   Biagio Borg, MD  ?mirabegron ER (MYRBETRIQ) 50 MG TB24 tablet Take 50 mg by mouth daily.    [provider]  ?rivastigmine (EXELON) 9.5 mg/24hr 1 Patch, TOP, DAILY, 0 Refill(s), Maintenance 12/04/20   [provider]  ?rosuvastatin (CRESTOR) 5 MG tablet TAKE 1 TABLET (5 MG TOTAL) BY MOUTH DAILY. 08/05/21   Jerline Pain, MD  ?   ? ?Allergies    ?Statins   ? ? ? ?Physical Exam ?Updated Vital Signs ?BP 136/75   Pulse 79   Temp 98.3 ?F (36.8 ?C) (Oral)   Resp 18   Ht '5\' 10"'$  (1.778 m)   Wt 74.8 kg   SpO2 98%   BMI 23.68 kg/m?  ?Physical Exam ?Constitutional:   ?   General: He is not in acute distress. ?   Comments: Chronically ill-appearing  ?HENT:  ?   Head: Normocephalic.  ?   Mouth/Throat:  ?   Mouth: Mucous membranes are moist.  ?   Pharynx: Oropharynx is clear.  ?Eyes:  ?   Extraocular Movements: Extraocular movements intact.  ?   Pupils: Pupils are equal, round, and reactive to light.  ?  Cardiovascular:  ?   Rate and Rhythm: Normal rate.  ?Pulmonary:  ?   Effort: No respiratory distress.  ?   Breath sounds: No wheezing.  ?   Comments: Left lower chest lateral tenderness, no obvious deformity ?Chest:  ?   Chest wall: Tenderness present.  ?Abdominal:  ?   Tenderness: There is abdominal tenderness.  ?   Comments: Tenderness to the lateral flank below the rib and there is mild tenderness around the epigastric region.  No guarding or rebound.  ?Musculoskeletal:     ?   General: No swelling, tenderness, deformity or signs of injury.  ?   Cervical back: No rigidity or tenderness.  ?Skin: ?   General: Skin is warm.  ?   Capillary Refill: Capillary refill takes less than 2 seconds.  ?Neurological:  ?   General: No  focal deficit present.  ?   Sensory: No sensory deficit.  ? ? ?ED Results / Procedures / Treatments   ?Labs ?(all labs ordered are listed, but only abnormal results are displayed) ?Labs Reviewed  ?CBC WITH DIFFERENTIAL/PLATELET - Abnormal; Notable for the following components:  ?    Result Value  ? RBC 3.23 (*)   ? Hemoglobin 10.3 (*)   ? HCT 32.0 (*)   ? RDW 15.6 (*)   ? All other components within normal limits  ?COMPREHENSIVE METABOLIC PANEL - Abnormal; Notable for the following components:  ? Glucose, Bld 115 (*)   ? BUN 54 (*)   ? Creatinine, Ser 2.98 (*)   ? Calcium 8.6 (*)   ? Total Protein 6.3 (*)   ? GFR, Estimated 22 (*)   ? All other components within normal limits  ? ? ?EKG ?None ? ?Radiology ?DG Ribs Unilateral W/Chest Left ? ?Result Date: 08/12/2021 ?CLINICAL DATA:  Fall EXAM: LEFT RIBS AND CHEST - 3+ VIEW COMPARISON:  01/13/2019 FINDINGS: Heart is normal size. No confluent airspace opacities, effusions or pneumothorax. No visible displaced rib fracture. IMPRESSION: No acute cardiopulmonary disease. No visible rib fracture. Electronically Signed   By: Rolm Baptise M.D.   On: 08/12/2021 23:19   ? ?Procedures ?Procedures  ? ?Medications Ordered in ED ?Medications  ?lactated ringers bolus 1,000 mL (1,000 mLs Intravenous New Bag/Given 08/12/21 2231)  ? ? ?ED Course/ Medical Decision Making/ A&P ?  ?                        ?Medical Decision Making ?Problems Addressed: ?Fall, initial encounter: acute illness or injury that poses a threat to life or bodily functions ? ?Amount and/or Complexity of Data Reviewed ?Labs: ordered. Decision-making details documented in ED Course. ?Radiology: ordered. ? ? ?Patient is 71 year old male who presents to the emergency department after mechanical fall.  Contrast was found to be hypotensive in the 87O systolic.  Patient hypotensive immediately resolved without any intervention.  His repeat blood pressure was in the 676 systolic.  Patient is not tachycardic or febrile.  No  report of head injury or loss of consciousness.  Physical exam as above. ? ?Patient hypotension most likely chronic.  Per chart review he does have orthostatic hypotension and is on medication including (Florinef and Midodrine).  At this time, less concern for severe bleed or intracranial etiology causing his hypotension.  This is most likely medication induced versus orthostatic.  In regards to the left flank pain, there is concern for possible rib fracture.  He does have history of multiple rib fracture and  at this point x-ray would be less sensitive.  Furthermore, cannot rule out other solid organ injury including his spleen.  Thus we will obtain CT chest abdominal pelvis based on his exam. ? ?His CBC without leukocytosis.  Hemoglobin of 10.3 and hematocrit of 32.0.  This is close to his baseline however slowly downtrending.  CMP without severe metabolic derangement.  His creatinine is 2.98.  Creatinine is baseline. ? ?Intervention: Gave patient a bolus of LR ? ?-Patient care is not done during my shift.  Care transferred to the oncoming provider. ?Plan at signout include following up with CT imaging and to address any acute etiology ? ?Final Clinical Impression(s) / ED Diagnoses ?Final diagnoses:  ?Fall, initial encounter  ? ? ?Rx / DC Orders ?ED Discharge Orders   ? ? None  ? ?  ? ? ?  ?Donnamarie Poag, MD ?08/12/21 2359 ? ?  ?Drenda Freeze, MD ?08/13/21 2326 ? ?

## 2021-08-12 NOTE — ED Notes (Signed)
MANUAL BP WAS 60/42  ?

## 2021-08-12 NOTE — ED Triage Notes (Signed)
Pt has history of parkinsons and had mechanical fall today. Pt fell onto left side and is having left sided rib pain. Pt also has manual bp of 60/42. History of orthostatic syncope and takes midodrine to hypotension. Pt denies hitting head and is not on  blood thinners ?

## 2021-08-12 NOTE — ED Provider Triage Note (Signed)
Emergency Medicine Provider Triage Evaluation Note ? ?John Herring , a 71 y.o. male  was evaluated in triage.  Pt complains of mechanical fall.  History provided by wife at bedside.  Patient was in his normal state of health today and got up during dinner and fell against a cabinet on the left backside.  He did not hit his head or lose consciousness.  His wife states that he has a history of mechanical falls, will he has a history of multiple rib fractures, empyema, and subsequent hospitalization and rehab.  She states she wanted to get him checked out given the complications from his last fall.  He was noted to be hypotensive with a blood pressure 66/46 manually, the patient's wife states that he has a vast fluctuation in his blood pressures and that this number is not uncommon for him.  He is on midodrine and sees a specialist outpatient.  She denies any changes in his mental status, fevers, chills, increasing weakness. ? ?Review of Systems  ?Positive: As above ?Negative: As above ? ?Physical Exam  ?BP (!) 66/46   Pulse 80   Temp 98.3 ?F (36.8 ?C) (Oral)   Resp 16   SpO2 98%  ?Gen:   Awake, no distress   ?Resp:  Normal effort  ?MSK:   Moves extremities without difficulty  ?Other:  Area of erythema to the left posterior ribs.  Some midline tenderness to the thoracic spine but no obvious step-offs or crepitus.  Patient has a tremor at baseline.  Moving all 4 extremities. ? ?Medical Decision Making  ?Medically screening exam initiated at 10:09 PM.  Appropriate orders placed.  John Herring was informed that the remainder of the evaluation will be completed by another provider, this initial triage assessment does not replace that evaluation, and the importance of remaining in the ED until their evaluation is complete. ? ?RN staff notified to triage patient to the back given blood pressure reading.  Basic lab work, lactic, UA ordered to rule out sepsis. ?  ?Garald Balding, PA-C ?08/12/21 2212 ? ?

## 2021-08-13 ENCOUNTER — Emergency Department (HOSPITAL_COMMUNITY): Payer: Medicare PPO

## 2021-08-13 DIAGNOSIS — S2241XA Multiple fractures of ribs, right side, initial encounter for closed fracture: Secondary | ICD-10-CM | POA: Diagnosis not present

## 2021-08-13 DIAGNOSIS — S3991XA Unspecified injury of abdomen, initial encounter: Secondary | ICD-10-CM | POA: Diagnosis not present

## 2021-08-13 MED ORDER — FENTANYL CITRATE PF 50 MCG/ML IJ SOSY
50.0000 ug | PREFILLED_SYRINGE | Freq: Once | INTRAMUSCULAR | Status: AC
  Administered 2021-08-13: 50 ug via INTRAVENOUS
  Filled 2021-08-13: qty 1

## 2021-08-13 MED ORDER — OXYCODONE-ACETAMINOPHEN 5-325 MG PO TABS: 1.0000 | ORAL_TABLET | Freq: Four times a day (QID) | ORAL | 0 refills | Status: DC | PRN

## 2021-08-13 MED ORDER — HYDROCODONE-ACETAMINOPHEN 5-325 MG PO TABS
1.0000 | ORAL_TABLET | Freq: Once | ORAL | Status: AC
Start: 2021-08-13 — End: 2021-08-13
  Administered 2021-08-13: 1 via ORAL
  Filled 2021-08-13: qty 1

## 2021-08-13 NOTE — ED Notes (Signed)
Patient verbalizes understanding of d/c instructions. Opportunities for questions and answers were provided. Pt d/c from ED and wheeled to lobby with wife.  

## 2021-08-13 NOTE — ED Provider Notes (Signed)
Patient received in signout.  Pending CT chest abdomen pelvis.  Received a call from CT.  Patient's creatinine does not allow for contrast.  Changed to CT scan without contrast.  He has been hemodynamically stable since initial low BP.  He has a history of orthostatic hypotension and is on midodrine.  CT scan does not show any intrathoracic or abdominal injuries.  He does have 2 new rib fractures on the right.  Prior history of complications secondary to rib fractures including empyema.  Patient was given an incentive spirometer.  I discussed the results with the patient and his wife at the bedside.  Feel it is reasonable for a trial of pain control and incentive spirometry at home with pulmonary toilet.  Otherwise his lungs look good without pneumothorax or hemothorax.  Patient and his wife are agreeable to plan.  He was provided with incentive spirometer and given instructions from respiratory therapy.  Additionally he will be discharged home with a prescription for Percocet ? ?Physical Exam  ?BP 135/81   Pulse 95   Temp 98.3 ?F (36.8 ?C) (Oral)   Resp 16   Ht 1.778 m ('5\' 10"'$ )   Wt 74.8 kg   SpO2 95%   BMI 23.68 kg/m?  ? ? Physical Exam ?Elderly, ABCs intact ?No respiratory distress ?Procedures  ?Procedures ? ?ED Course / MDM  ?  ?Medical Decision Making ?Amount and/or Complexity of Data Reviewed ?Radiology: ordered. ? ?Risk ?Prescription drug management. ? ? ?Problem List Items Addressed This Visit   ?None ?Visit Diagnoses   ? ? Fall, initial encounter    -  Primary  ? Closed fracture of multiple ribs of left side, initial encounter      ? ?  ?  ? ? ? ? ?  ?Merryl Hacker, MD ?08/13/21 0150 ? ?

## 2021-08-14 DIAGNOSIS — R269 Unspecified abnormalities of gait and mobility: Secondary | ICD-10-CM | POA: Diagnosis not present

## 2021-08-14 DIAGNOSIS — G20 Parkinson's disease: Secondary | ICD-10-CM | POA: Diagnosis not present

## 2021-08-19 DIAGNOSIS — R269 Unspecified abnormalities of gait and mobility: Secondary | ICD-10-CM | POA: Diagnosis not present

## 2021-08-19 DIAGNOSIS — G20 Parkinson's disease: Secondary | ICD-10-CM | POA: Diagnosis not present

## 2021-08-20 ENCOUNTER — Ambulatory Visit (INDEPENDENT_AMBULATORY_CARE_PROVIDER_SITE_OTHER): Payer: Medicare PPO | Admitting: Internal Medicine

## 2021-08-20 ENCOUNTER — Encounter: Payer: Self-pay | Admitting: Internal Medicine

## 2021-08-20 VITALS — BP 164/92 | HR 64 | Ht 70.0 in | Wt 170.2 lb

## 2021-08-20 DIAGNOSIS — R296 Repeated falls: Secondary | ICD-10-CM | POA: Diagnosis not present

## 2021-08-20 DIAGNOSIS — E559 Vitamin D deficiency, unspecified: Secondary | ICD-10-CM | POA: Diagnosis not present

## 2021-08-20 DIAGNOSIS — E538 Deficiency of other specified B group vitamins: Secondary | ICD-10-CM

## 2021-08-20 DIAGNOSIS — R739 Hyperglycemia, unspecified: Secondary | ICD-10-CM | POA: Diagnosis not present

## 2021-08-20 DIAGNOSIS — N1832 Chronic kidney disease, stage 3b: Secondary | ICD-10-CM

## 2021-08-20 DIAGNOSIS — Z0001 Encounter for general adult medical examination with abnormal findings: Secondary | ICD-10-CM

## 2021-08-20 DIAGNOSIS — S2242XD Multiple fractures of ribs, left side, subsequent encounter for fracture with routine healing: Secondary | ICD-10-CM

## 2021-08-20 DIAGNOSIS — D649 Anemia, unspecified: Secondary | ICD-10-CM

## 2021-08-20 LAB — CBC WITH DIFFERENTIAL/PLATELET
Basophils Absolute: 0.1 10*3/uL (ref 0.0–0.1)
Basophils Relative: 1.3 % (ref 0.0–3.0)
Eosinophils Absolute: 0.2 10*3/uL (ref 0.0–0.7)
Eosinophils Relative: 2.4 % (ref 0.0–5.0)
HCT: 34.5 % — ABNORMAL LOW (ref 39.0–52.0)
Hemoglobin: 11.7 g/dL — ABNORMAL LOW (ref 13.0–17.0)
Lymphocytes Relative: 20.8 % (ref 12.0–46.0)
Lymphs Abs: 1.6 10*3/uL (ref 0.7–4.0)
MCHC: 33.8 g/dL (ref 30.0–36.0)
MCV: 94.2 fl (ref 78.0–100.0)
Monocytes Absolute: 0.7 10*3/uL (ref 0.1–1.0)
Monocytes Relative: 8.8 % (ref 3.0–12.0)
Neutro Abs: 5.2 10*3/uL (ref 1.4–7.7)
Neutrophils Relative %: 66.7 % (ref 43.0–77.0)
Platelets: 250 10*3/uL (ref 150.0–400.0)
RBC: 3.67 Mil/uL — ABNORMAL LOW (ref 4.22–5.81)
RDW: 16.1 % — ABNORMAL HIGH (ref 11.5–15.5)
WBC: 7.9 10*3/uL (ref 4.0–10.5)

## 2021-08-20 LAB — BASIC METABOLIC PANEL
BUN: 46 mg/dL — ABNORMAL HIGH (ref 6–23)
CO2: 26 mEq/L (ref 19–32)
Calcium: 9.3 mg/dL (ref 8.4–10.5)
Chloride: 104 mEq/L (ref 96–112)
Creatinine, Ser: 3.11 mg/dL — ABNORMAL HIGH (ref 0.40–1.50)
GFR: 19.57 mL/min — ABNORMAL LOW (ref >60.00)
Glucose, Bld: 84 mg/dL (ref 70–99)
Potassium: 5.3 mEq/L — ABNORMAL HIGH (ref 3.5–5.1)
Sodium: 138 mEq/L (ref 135–145)

## 2021-08-20 LAB — IBC PANEL
Iron: 62 ug/dL (ref 42–165)
Saturation Ratios: 19.8 % — ABNORMAL LOW (ref 20.0–50.0)
TIBC: 313.6 ug/dL (ref 250.0–450.0)
Transferrin: 224 mg/dL (ref 212.0–360.0)

## 2021-08-20 LAB — HEPATIC FUNCTION PANEL
ALT: 6 U/L (ref 0–53)
AST: 18 U/L (ref 0–37)
Albumin: 4.4 g/dL (ref 3.5–5.2)
Alkaline Phosphatase: 109 U/L (ref 39–117)
Bilirubin, Direct: 0.1 mg/dL (ref 0.0–0.3)
Total Bilirubin: 0.4 mg/dL (ref 0.2–1.2)
Total Protein: 7.3 g/dL (ref 6.0–8.3)

## 2021-08-20 LAB — FERRITIN: Ferritin: 122.3 ng/mL (ref 22.0–322.0)

## 2021-08-20 LAB — VITAMIN B12: Vitamin B-12: 518 pg/mL (ref 211–911)

## 2021-08-20 MED ORDER — MIRABEGRON ER 50 MG PO TB24: 50.0000 mg | ORAL_TABLET | Freq: Every day | ORAL | 3 refills | Status: AC

## 2021-08-20 NOTE — Progress Notes (Signed)
Patient ID: John Herring, male   DOB: [birth year], 71 y.o.   MRN: [id number] ? ? ? ?     Chief Complaint:: wellness exam and Annual Exam (Patient fell last Monday and broke two ribs on left side) ? , anemia, elevated BP ? ?     HPI:  John Herring is a 71 y.o. male here for wellness exam; declines shingrix, and covid booster ?         ?              Also just took sliding scale midodrine before coming for appt, BP usually elevated at home due to this.  Also taking florinef.  Did have fall last mon - has 2 left rib fx, pain persists but mild and tolerable.  Pt denies other chest pain, increased sob or doe, wheezing, orthopnea, PND, increased LE swelling, palpitations, or syncope.   Pt denies fever, wt loss, night sweats, loss of appetite, or other constitutional symptoms  in fact has gained several lbs.   Pt denies polydipsia, polyuria.  No overt bleeding.  No other new complaints.    ?  ?Wt Readings from Last 3 Encounters:  ?08/20/21 170 lb 3.2 oz (77.2 kg)  ?08/12/21 165 lb (74.8 kg)  ?05/29/21 167 lb (75.8 kg)  ? ?BP Readings from Last 3 Encounters:  ?08/20/21 (!) 164/92  ?08/13/21 135/81  ?05/29/21 116/90  ? ?Immunization History  ?Administered Date(s) Administered  ? Fluad Quad(high Dose 65+) 01/05/2019, 02/07/2020  ? Influenza,inj,Quad PF,6+ Mos 02/09/2015  ? Influenza-Unspecified 12/20/2012, 01/31/2017, 01/02/2018, 02/05/2021  ? PFIZER(Purple Top)SARS-COV-2 Vaccination 05/10/2019, 05/31/2019, 01/17/2020, 09/06/2020, 02/05/2021  ? Pneumococcal Conjugate-13 08/08/2019  ? Pneumococcal Polysaccharide-23 08/17/2020  ? Td 06/09/2008  ? Tdap 08/08/2019  ? Zoster, Live 05/03/2001  ? ?There are no preventive care reminders to display for this patient. ? ?  ? ?Past Medical History:  ?Diagnosis Date  ? Arthritis   ? CKD (chronic kidney disease), stage III ([place])   ? Depression 05/06/2011  ? Enlarged prostate   ? Former consumption of alcohol   ? GERD (gastroesophageal reflux disease) 12/02/2012  ? patient denies this dx    ? History of kidney stones   ? passed stone  ? Hyperlipidemia 05/06/2011  ? Hypersomnia 05/06/2011  ? Hypotension   ? 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 Memory loss 07/12/2012  ? 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 Skin cancer 05/06/2011  ? Syncope   ? orthostatic syncope, no problems in tha last 3-4 months  ? ?Past Surgical History:  ?Procedure Laterality Date  ? BASAL CELL CARCINOMA EXCISION N/A 03/15/2018  ? Procedure: RE EXCISION BACK LESION;  Surgeon: Irene Limbo, MD;  Location: [place];  Service: Plastics;  Laterality: N/A;  ? CARDIAC CATHETERIZATION  02/28/2019  ? COLONOSCOPY  2013  ? deep brain stimulation    ? Parkinson's disease  ? LEFT HEART CATH AND CORONARY ANGIOGRAPHY N/A 02/28/2019  ? Procedure: LEFT HEART CATH AND CORONARY ANGIOGRAPHY;  Surgeon: Nelva Bush, MD;  Location: [place] [facility];  Service: Cardiovascular;  Laterality: N/A;  ? MASS EXCISION N/A 02/12/2018  ? Procedure: excision lesion back, layered closure 15 cm;  Surgeon: Irene Limbo, MD;  Location: [place];  Service: Plastics;  Laterality: N/A;  ? SKIN SPLIT GRAFT Left 03/15/2018  ? Procedure: SKIN GRAFT FROM RIGHT OR LEFT THIGH;  Surgeon: Irene Limbo, MD;  Location: [place];  Service: Plastics;  Laterality: Left;  ? SUBTHALAMIC  STIMULATOR BATTERY REPLACEMENT N/A 09/01/2012  ? Procedure: SUBTHALAMIC STIMULATOR BATTERY REPLACEMENT;  Surgeon: Erline Levine, MD;  Location: [place] [facility];  Service: Neurosurgery;  Laterality: N/A;  Deep Brain Stimulator battery change  ? SUBTHALAMIC STIMULATOR BATTERY REPLACEMENT N/A 07/19/2015  ? Procedure: Deep Brain stimulator battery replacement;  Surgeon: Erline Levine, MD;  Location: [place] [facility];  Service: Neurosurgery;  Laterality: N/A;  Deep Brain stimulator battery replacement  ? SUBTHALAMIC STIMULATOR BATTERY REPLACEMENT N/A 01/20/2019  ? Procedure: Deep brain stimulator battery change;  Surgeon: Erline Levine, MD;   Location: [place];  Service: Neurosurgery;  Laterality: N/A;  Deep brain stimulator battery change  ? TONSILLECTOMY    ? As a child  ? ? reports that he has never smoked. He has never used smokeless tobacco. He reports that he does not drink alcohol and does not use drugs. ?family history is not on file. ?Allergies  ?Allergen Reactions  ? Statins   ?  Makes parkinson worse, blood pressure drop  ? ?Current Outpatient Medications on File Prior to Visit  ?Medication Sig Dispense Refill  ? aspirin EC 81 MG tablet Take 1 tablet (81 mg total) by mouth daily. 90 tablet 3  ? carbidopa-levodopa (SINEMET IR) 25-100 MG tablet Take 1 tablet by mouth 6 (six) times daily. Taking 1 tablet every 2-3 hours daily    ? Cholecalciferol (VITAMIN D3) 50 MCG (2000 UT) TABS Take 50 mcg by mouth daily.    ? dextromethorphan (DELSYM) 30 MG/5ML liquid Take by mouth.    ? diclofenac (VOLTAREN) 50 MG EC tablet     ? ezetimibe (ZETIA) 10 MG tablet TAKE 1 TABLET BY MOUTH EVERY DAY 90 tablet 0  ? finasteride (PROSCAR) 5 MG tablet = 1 Tab, ORAL, QEvening, 0 Refill(s), Maintenance    ? fludrocortisone (FLORINEF) 0.1 MG tablet Take by mouth daily.    ? midodrine (PROAMATINE) 5 MG tablet TAKE 1 TABLET BY MOUTH 3 TIMES DAILY WITH MEALS. (Patient taking differently: Take 5 mg by mouth 4 (four) times daily.) 270 tablet 3  ? rivastigmine (EXELON) 9.5 mg/24hr 1 Patch, TOP, DAILY, 0 Refill(s), Maintenance    ? rosuvastatin (CRESTOR) 5 MG tablet TAKE 1 TABLET (5 MG TOTAL) BY MOUTH DAILY. 90 tablet 3  ? oxyCODONE-acetaminophen (PERCOCET/ROXICET) 5-325 MG tablet Take 1 tablet by mouth every 6 (six) hours as needed for severe pain. (Patient not taking: Reported on 08/20/2021) 15 tablet 0  ? triamcinolone cream (KENALOG) 0.1 % Apply 1 application. topically 2 (two) times daily.    ? ?No current facility-administered medications on file prior to visit.  ? ?     ROS:  All others reviewed and negative. ? ?Objective  ? ?     PE:  BP (!) 164/92 (BP Location: Right Arm,  Patient Position: Sitting, Cuff Size: Large)   Pulse 64   Ht '5\' 10"'$  (1.778 m)   Wt 170 lb 3.2 oz (77.2 kg)   SpO2 100%   BMI 24.42 kg/m?  ? ?              Constitutional: Pt appears in NAD ?              HENT: Head: NCAT.  ?              Right Ear: External ear normal.   ?              Left Ear: External ear normal.  ?  Eyes: . Pupils are equal, round, and reactive to light. Conjunctivae and EOM are normal ?              Nose: without d/c or deformity ?              Neck: Neck supple. Gross normal ROM ?              Cardiovascular: Normal rate and regular rhythm.   ?              Pulmonary/Chest: Effort normal and breath sounds without rales or wheezing.  ?              Abd:  Soft, NT, ND, + BS, no organomegaly ?              Neurological: Pt is alert. At baseline orientation, motor grossly intact ?              Skin: Skin is warm. No rashes, no other new lesions, LE edema - none ?              Psychiatric: Pt behavior is normal without agitation  ? ?Micro: none ? ?Cardiac tracings I have personally interpreted today:  none ? ?Pertinent Radiological findings (summarize): none  ? ?Lab Results  ?Component Value Date  ? WBC 7.9 08/20/2021  ? HGB 11.7 (L) 08/20/2021  ? HCT 34.5 (L) 08/20/2021  ? PLT 250.0 08/20/2021  ? GLUCOSE 84 08/20/2021  ? CHOL 126 08/17/2020  ? TRIG 123.0 08/17/2020  ? HDL 45.20 08/17/2020  ? LDLDIRECT 76.0 06/06/2019  ? [place] 56 08/17/2020  ? ALT 6 08/20/2021  ? AST 18 08/20/2021  ? NA 138 08/20/2021  ? K 5.3 (H) 08/20/2021  ? CL 104 08/20/2021  ? CREATININE 3.11 (H) 08/20/2021  ? BUN 46 (H) 08/20/2021  ? CO2 26 08/20/2021  ? TSH 2.68 08/17/2020  ? PSA 3.29 08/17/2020  ? HGBA1C 5.6 08/17/2020  ? ?Assessment/Plan:  ?John Herring is a 71 y.o. 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

## 2021-08-20 NOTE — Patient Instructions (Addendum)
Please continue all other medications as before, and refills have been done if requested. ? ?Please have the pharmacy call with any other refills you may need. ? ?Please continue your efforts at being more active, low cholesterol diet, and weight control. ? ?You are otherwise up to date with prevention measures today. ? ?Please keep your appointments with your specialists as you may have planned - Dr Osborne Casco ? ?Please go to the LAB at the blood drawing area for the tests to be done ? ?You will be contacted by phone if any changes need to be made immediately.  Otherwise, you will receive a letter about your results with an explanation, but please check with MyChart first. ? ?Please remember to sign up for MyChart if you have not done so, as this will be important to you in the future with finding out test results, communicating by private email, and scheduling acute appointments online when needed. ? ?Please make an Appointment to return in 6 months, or sooner if needed ?

## 2021-08-21 DIAGNOSIS — R269 Unspecified abnormalities of gait and mobility: Secondary | ICD-10-CM | POA: Diagnosis not present

## 2021-08-21 DIAGNOSIS — G20 Parkinson's disease: Secondary | ICD-10-CM | POA: Diagnosis not present

## 2021-08-25 ENCOUNTER — Encounter: Payer: Self-pay | Admitting: Internal Medicine

## 2021-08-25 DIAGNOSIS — G20 Parkinson's disease: Secondary | ICD-10-CM | POA: Insufficient documentation

## 2021-08-25 DIAGNOSIS — D649 Anemia, unspecified: Secondary | ICD-10-CM | POA: Insufficient documentation

## 2021-08-25 DIAGNOSIS — S2249XA Multiple fractures of ribs, unspecified side, initial encounter for closed fracture: Secondary | ICD-10-CM | POA: Insufficient documentation

## 2021-08-25 NOTE — Assessment & Plan Note (Signed)
Last vitamin D Lab Results  Component Value Date   VD25OH 41.74 08/17/2020   Stable, cont oral replacement  

## 2021-08-25 NOTE — Assessment & Plan Note (Signed)
Lab Results  ?Component Value Date  ? [id number] 08/20/2021  ? ?Stable, cont oral replacement - b12 1000 mcg qd ? ?

## 2021-08-25 NOTE — Assessment & Plan Note (Signed)
Only one recent, to continue sliding scale midodrine and florinef ?

## 2021-08-25 NOTE — Assessment & Plan Note (Signed)
?   Mild worsening, for f/u lab today, ?

## 2021-08-25 NOTE — Assessment & Plan Note (Signed)
Age and sex appropriate education and counseling updated with regular exercise and diet ?Referrals for preventative services - none needed ?Immunizations addressed - declines shingrix, and covid booster ?Smoking counseling  - none needed ?Evidence for depression or other mood disorder - none significant ?Most recent labs reviewed. ?I have personally reviewed and have noted: ?1) the patient's medical and social history ?2) The patient's current medications and supplements ?3) The patient's height, weight, and BMI have been recorded in the chart ? ?

## 2021-08-25 NOTE — Assessment & Plan Note (Signed)
With improving pain,  to f/u any worsening symptoms or concerns ?

## 2021-08-25 NOTE — Assessment & Plan Note (Signed)
Lab Results  Component Value Date   CREATININE 3.11 (H) 08/20/2021   Stable overall, cont to avoid nephrotoxins  

## 2021-08-25 NOTE — Assessment & Plan Note (Signed)
Lab Results  ?Component Value Date  ? HGBA1C 5.6 08/17/2020  ? ?Stable, pt to continue current medical treatment  - diet ? ?

## 2021-08-26 DIAGNOSIS — R269 Unspecified abnormalities of gait and mobility: Secondary | ICD-10-CM | POA: Diagnosis not present

## 2021-08-26 DIAGNOSIS — G20 Parkinson's disease: Secondary | ICD-10-CM | POA: Diagnosis not present

## 2021-08-28 DIAGNOSIS — F0280 Dementia in other diseases classified elsewhere without behavioral disturbance: Secondary | ICD-10-CM | POA: Diagnosis not present

## 2021-08-28 DIAGNOSIS — G20 Parkinson's disease: Secondary | ICD-10-CM | POA: Diagnosis not present

## 2021-08-28 DIAGNOSIS — K117 Disturbances of salivary secretion: Secondary | ICD-10-CM | POA: Diagnosis not present

## 2021-09-02 DIAGNOSIS — G20 Parkinson's disease: Secondary | ICD-10-CM | POA: Diagnosis not present

## 2021-09-02 DIAGNOSIS — R269 Unspecified abnormalities of gait and mobility: Secondary | ICD-10-CM | POA: Diagnosis not present

## 2021-09-04 DIAGNOSIS — G20 Parkinson's disease: Secondary | ICD-10-CM | POA: Diagnosis not present

## 2021-09-09 DIAGNOSIS — G20 Parkinson's disease: Secondary | ICD-10-CM | POA: Diagnosis not present

## 2021-09-13 DIAGNOSIS — G20 Parkinson's disease: Secondary | ICD-10-CM | POA: Diagnosis not present

## 2021-09-13 DIAGNOSIS — R269 Unspecified abnormalities of gait and mobility: Secondary | ICD-10-CM | POA: Diagnosis not present

## 2021-09-18 DIAGNOSIS — G20 Parkinson's disease: Secondary | ICD-10-CM | POA: Diagnosis not present

## 2021-09-24 ENCOUNTER — Other Ambulatory Visit: Payer: Self-pay | Admitting: Internal Medicine

## 2021-09-26 ENCOUNTER — Other Ambulatory Visit: Payer: Self-pay

## 2021-09-26 ENCOUNTER — Emergency Department (HOSPITAL_BASED_OUTPATIENT_CLINIC_OR_DEPARTMENT_OTHER): Payer: Medicare PPO

## 2021-09-26 ENCOUNTER — Encounter (HOSPITAL_BASED_OUTPATIENT_CLINIC_OR_DEPARTMENT_OTHER): Payer: Self-pay

## 2021-09-26 ENCOUNTER — Emergency Department (HOSPITAL_BASED_OUTPATIENT_CLINIC_OR_DEPARTMENT_OTHER): Payer: Medicare PPO | Admitting: Radiology

## 2021-09-26 DIAGNOSIS — Z85828 Personal history of other malignant neoplasm of skin: Secondary | ICD-10-CM | POA: Insufficient documentation

## 2021-09-26 DIAGNOSIS — N183 Chronic kidney disease, stage 3 unspecified: Secondary | ICD-10-CM | POA: Insufficient documentation

## 2021-09-26 DIAGNOSIS — S4992XA Unspecified injury of left shoulder and upper arm, initial encounter: Secondary | ICD-10-CM | POA: Diagnosis not present

## 2021-09-26 DIAGNOSIS — G20 Parkinson's disease: Secondary | ICD-10-CM | POA: Insufficient documentation

## 2021-09-26 DIAGNOSIS — M25512 Pain in left shoulder: Secondary | ICD-10-CM | POA: Insufficient documentation

## 2021-09-26 NOTE — ED Triage Notes (Signed)
Pt arrives POV with complaints of left shoulder pain that started last week after hitting it on a chair. Pt does not remember what happened but does remember hitting it on a chair. Per family, pt started complaining of pain today. No deformity noted and pulses present

## 2021-09-27 ENCOUNTER — Other Ambulatory Visit: Payer: Self-pay

## 2021-09-27 ENCOUNTER — Emergency Department (HOSPITAL_BASED_OUTPATIENT_CLINIC_OR_DEPARTMENT_OTHER)
Admission: EM | Admit: 2021-09-27 | Discharge: 2021-09-27 | Disposition: A | Discharge status: Home / Self Care | Payer: Medicare PPO | Attending: Emergency Medicine | Admitting: Emergency Medicine

## 2021-09-27 DIAGNOSIS — M25512 Pain in left shoulder: Secondary | ICD-10-CM

## 2021-09-27 MED ORDER — OXYCODONE HCL 5 MG PO TABS: 5.0000 mg | ORAL_TABLET | ORAL | 0 refills | Status: DC | PRN

## 2021-09-27 MED ORDER — LIDOCAINE 5 % EX PTCH: 1.0000 | MEDICATED_PATCH | CUTANEOUS | 0 refills | Status: DC

## 2021-09-27 NOTE — Discharge Instructions (Signed)
You were evaluated in the Emergency Department and after careful evaluation, we did not find any emergent condition requiring admission or further testing in the hospital.  Your exam/testing today is overall reassuring.  Symptoms seem to be due to a flare of arthritis in your left shoulder.  Recommend Tylenol 1000 mg every 4-6 hours.  Recommend use of the numbing patches to help with the pain.  Can use the oxycodone at night if you are having trouble sleeping, do not use it during the day.  Recommend follow-up with orthopedic specialist if not better in 1 to 2 weeks.  Please return to the Emergency Department if you experience any worsening of your condition.   Thank you for allowing [place] to be a part of your care.

## 2021-09-27 NOTE — ED Provider Notes (Signed)
[facility] Emergency Department Provider Note MRN:  [id number]  Arrival date & time: 09/27/21     Chief Complaint   Shoulder Pain   History of Present Illness   John Herring is a 71 y.o. year-old male with a history of Parkinson's, CKD presenting to the ED with chief complaint of shoulder pain.  Banged his shoulder a few days ago and pain is getting worse.  Left shoulder pain, worse with motion or palpation.  Denies chest pain, no shortness of breath, no other complaints.  Review of Systems  A thorough review of systems was obtained and all systems are negative except as noted in the HPI and PMH.   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   Skin cancer 05/06/2011   Syncope    orthostatic syncope, no problems in tha last 3-4 months    Past Surgical History:  Procedure Laterality Date   BASAL CELL CARCINOMA EXCISION N/A 03/15/2018   Procedure: RE [place];  Surgeon: Irene Limbo, MD;  Location: [place];  Service: Plastics;  Laterality: N/A;   CARDIAC CATHETERIZATION  02/28/2019   COLONOSCOPY  2013   deep brain stimulation     Parkinson's disease   LEFT HEART CATH AND CORONARY ANGIOGRAPHY N/A 02/28/2019   Procedure: LEFT HEART CATH AND CORONARY ANGIOGRAPHY;  Surgeon: Nelva Bush, MD;  Location: [place] [facility];  Service: Cardiovascular;  Laterality: N/A;   MASS EXCISION N/A 02/12/2018   Procedure: excision lesion back, layered closure 15 cm;  Surgeon:  Irene Limbo, MD;  Location: [place];  Service: Plastics;  Laterality: N/A;   SKIN SPLIT GRAFT Left 03/15/2018   Procedure: SKIN GRAFT FROM RIGHT OR LEFT THIGH;  Surgeon: Irene Limbo, MD;  Location: [place];  Service: Plastics;  Laterality: Left;   SUBTHALAMIC STIMULATOR BATTERY REPLACEMENT N/A 09/01/2012   Procedure: SUBTHALAMIC STIMULATOR BATTERY REPLACEMENT;  Surgeon: Erline Levine, MD;  Location: [place] [facility];  Service: Neurosurgery;  Laterality: N/A;  Deep Brain Stimulator battery change   SUBTHALAMIC STIMULATOR BATTERY REPLACEMENT N/A 07/19/2015   Procedure: Deep Brain stimulator battery replacement;  Surgeon: Erline Levine, MD;  Location: [place] [facility];  Service: Neurosurgery;  Laterality: N/A;  Deep Brain stimulator battery replacement   SUBTHALAMIC STIMULATOR BATTERY REPLACEMENT N/A 01/20/2019   Procedure: Deep brain stimulator battery change;  Surgeon: Erline Levine, MD;  Location: [place];  Service: Neurosurgery;  Laterality: N/A;  Deep brain stimulator battery change   TONSILLECTOMY     As a child    Family History  Problem Relation Age of Onset   Colon cancer Neg Hx     Social History   Socioeconomic History   Marital status: Married    Spouse name: Not on file   Number of children: 2   Years of education: 18   Highest education level: Not on file  Occupational History   Occupation: Disabled  Tobacco Use   Smoking status:  Never   Smokeless tobacco: Never  Vaping Use   Vaping Use: Never used  Substance and Sexual Activity   Alcohol use: No    Comment: heavy drinker at times in the past. None for 5 years   Drug use: No   Sexual activity: Not on file  Other Topics Concern   Not on file  Social History Narrative   Lives with wife   Social Determinants of Health   Financial Resource Strain: Low Risk  (08/05/2021)   Overall Financial Resource Strain (CARDIA)    Difficulty of Paying Living Expenses: Not hard at all  Food  Insecurity: No Food Insecurity (08/05/2021)   Hunger Vital Sign    Worried About Running Out of Food in the Last Year: Never true    [place] in the Last Year: Never true  Transportation Needs: No Transportation Needs (08/05/2021)   PRAPARE - Hydrologist (Medical): No    Lack of Transportation (Non-Medical): No  Physical Activity: Inactive (08/05/2021)   Exercise Vital Sign    Days of Exercise per Week: 0 days    Minutes of Exercise per Session: 0 min  Stress: No Stress Concern Present (08/05/2021)   [place]    Feeling of Stress : Not at all  Social Connections: [place] (08/05/2021)   Social Connection and Isolation Panel [NHANES]    Frequency of Communication with Friends and Family: More than three times a week    Frequency of Social Gatherings with Friends and Family: More than three times a week    Attends Religious Services: 1 to 4 times per year    Active Member of [employer] or Organizations: No    Attends Music therapist: 1 to 4 times per year    Marital Status: Married  Human resources officer Violence: Not At Risk (08/05/2021)   Humiliation, Afraid, Rape, and Kick questionnaire    Fear of Current or Ex-Partner: No    Emotionally Abused: No    Physically Abused: No    Sexually Abused: No     Physical Exam   Vitals:   09/26/21 1942  BP: (!) 161/106  Pulse: 77  Resp: 18  Temp: 97.7 F (36.5 C)  SpO2: 100%    CONSTITUTIONAL: Chronically ill-appearing, NAD NEURO/PSYCH:  Alert and oriented x 3, no focal deficits EYES:  eyes equal and reactive ENT/NECK:  no LAD, no JVD CARDIO: Regular rate, well-perfused, normal S1 and S2 PULM:  CTAB no wheezing or rhonchi GI/GU:  non-distended, non-tender MSK/SPINE:  No gross deformities, no edema, largely preserved range of motion of the left shoulder though crepitus is appreciated with range of motion SKIN:  no rash,  atraumatic   *Additional and/or pertinent findings included in MDM below  Diagnostic and Interventional Summary    EKG Interpretation  Date/Time:    Ventricular Rate:    PR Interval:    QRS Duration:   QT Interval:    QTC Calculation:   R Axis:     Text Interpretation:         Labs Reviewed - No data to display  DG Shoulder Left Port  Final Result      Medications - No data to display   Procedures  /  Critical Care Procedures  ED Course and Medical Decision Making  Initial Impression and Ddx Mild trauma to the left shoulder now with worsening pain.  Preserved range of motion, no  fever, no overlying erythema, nothing to suggest septic joint.  X-rays reassuring with no acute fracture, there is evidence of arthritic changes, old Hill-Sachs injury.  Suspect flare of arthritis as the cause of patient's pain.  The left shoulder pain seem to acutely worsen during dinner this evening, less likely but cardiac cause of pain is considered.  Will obtain screening EKG.  Anticipating discharge.  Past medical/surgical history that increases complexity of ED encounter: Parkinson's  Interpretation of Diagnostics I personally reviewed the shoulder x-ray and my interpretation is as follows: No acute fracture noted, no dislocation    Patient Reassessment and Ultimate Disposition/Management     Appropriate for discharge.  Patient management required discussion with the following services or consulting groups:  None  Complexity of Problems Addressed Acute complicated illness or Injury  Additional Data Reviewed and Analyzed Further history obtained from: Further history from spouse/family member  Additional Factors Impacting ED Encounter Risk Prescriptions  Barth Kirks. Sedonia Small, [place] [email or web address]  Final Clinical Impressions(s) / ED Diagnoses     ICD-10-CM   1. Acute pain of left shoulder  M25.512       ED  Discharge Orders          Ordered    oxyCODONE (ROXICODONE) 5 MG immediate release tablet  Every 4 hours PRN        09/27/21 0140    lidocaine (LIDODERM) 5 %  Every 24 hours        09/27/21 0140             Discharge Instructions Discussed with and Provided to Patient:     Discharge Instructions      You were evaluated in the Emergency Department and after careful evaluation, we did not find any emergent condition requiring admission or further testing in the hospital.  Your exam/testing today is overall reassuring.  Symptoms seem to be due to a flare of arthritis in your left shoulder.  Recommend Tylenol 1000 mg every 4-6 hours.  Recommend use of the numbing patches to help with the pain.  Can use the oxycodone at night if you are having trouble sleeping, do not use it during the day.  Recommend follow-up with orthopedic specialist if not better in 1 to 2 weeks.  Please return to the Emergency Department if you experience any worsening of your condition.   Thank you for allowing [place] to be a part of your care.       Maudie Flakes, MD 09/27/21 [phone]

## 2021-10-08 DIAGNOSIS — D485 Neoplasm of uncertain behavior of skin: Secondary | ICD-10-CM | POA: Diagnosis not present

## 2021-10-08 DIAGNOSIS — L821 Other seborrheic keratosis: Secondary | ICD-10-CM | POA: Diagnosis not present

## 2021-10-08 DIAGNOSIS — C44619 Basal cell carcinoma of skin of left upper limb, including shoulder: Secondary | ICD-10-CM | POA: Diagnosis not present

## 2021-10-08 DIAGNOSIS — D225 Melanocytic nevi of trunk: Secondary | ICD-10-CM | POA: Diagnosis not present

## 2021-10-08 DIAGNOSIS — Z85828 Personal history of other malignant neoplasm of skin: Secondary | ICD-10-CM | POA: Diagnosis not present

## 2021-10-08 DIAGNOSIS — L570 Actinic keratosis: Secondary | ICD-10-CM | POA: Diagnosis not present

## 2021-10-30 DIAGNOSIS — R351 Nocturia: Secondary | ICD-10-CM | POA: Diagnosis not present

## 2021-10-30 DIAGNOSIS — N401 Enlarged prostate with lower urinary tract symptoms: Secondary | ICD-10-CM | POA: Diagnosis not present

## 2021-10-30 DIAGNOSIS — R972 Elevated prostate specific antigen [PSA]: Secondary | ICD-10-CM | POA: Diagnosis not present

## 2021-11-07 ENCOUNTER — Other Ambulatory Visit: Payer: Self-pay | Admitting: Internal Medicine

## 2021-11-07 NOTE — Telephone Encounter (Signed)
Please refill as per office routine med refill policy (all routine meds to be refilled for 3 mo or monthly (per pt preference) up to one year from last visit, then month to month grace period for 3 mo, then further med refills will have to be denied) ? ?

## 2021-11-20 DIAGNOSIS — K117 Disturbances of salivary secretion: Secondary | ICD-10-CM | POA: Diagnosis not present

## 2021-11-20 DIAGNOSIS — G20 Parkinson's disease: Secondary | ICD-10-CM | POA: Diagnosis not present

## 2021-11-20 DIAGNOSIS — Z9682 Presence of neurostimulator: Secondary | ICD-10-CM | POA: Diagnosis not present

## 2021-11-20 DIAGNOSIS — N184 Chronic kidney disease, stage 4 (severe): Secondary | ICD-10-CM | POA: Diagnosis not present

## 2021-11-21 DIAGNOSIS — N184 Chronic kidney disease, stage 4 (severe): Secondary | ICD-10-CM | POA: Diagnosis not present

## 2021-11-26 ENCOUNTER — Encounter: Payer: Self-pay | Admitting: Internal Medicine

## 2021-11-26 ENCOUNTER — Ambulatory Visit: Payer: Medicare PPO | Admitting: Internal Medicine

## 2021-11-26 VITALS — BP 158/82 | HR 62 | Temp 98.1°F | Ht 70.0 in | Wt 166.0 lb

## 2021-11-26 DIAGNOSIS — N1832 Chronic kidney disease, stage 3b: Secondary | ICD-10-CM

## 2021-11-26 DIAGNOSIS — R269 Unspecified abnormalities of gait and mobility: Secondary | ICD-10-CM

## 2021-11-26 DIAGNOSIS — R296 Repeated falls: Secondary | ICD-10-CM | POA: Diagnosis not present

## 2021-11-26 DIAGNOSIS — I951 Orthostatic hypotension: Secondary | ICD-10-CM | POA: Diagnosis not present

## 2021-11-26 DIAGNOSIS — M25512 Pain in left shoulder: Secondary | ICD-10-CM | POA: Insufficient documentation

## 2021-11-26 DIAGNOSIS — G20 Parkinson's disease: Secondary | ICD-10-CM

## 2021-11-26 NOTE — Progress Notes (Signed)
Patient ID: John Herring, male   DOB: [birth year], 71 y.o.   MRN: [id number]        Chief Complaint: follow up ckd 4, htn, left shoulder pain after recurring falls       HPI:  John Herring is a 71 y.o. male here with c/o several worsening falls though orthostatic BP has been improved with midodrine, leaving for [place] and asks for PT start after aug 30.  Last fall with left shoulder pain and decreased ROM, ? Frozen shoulder per UC after negative films.  Seeing Renal Dr Osborne Casco with lab last wk, and OV tomorrow, so declines further lab for now.  Pt denies chest pain, increased sob or doe, wheezing, orthopnea, PND, increased LE swelling, palpitations, dizziness or syncope.   Pt denies polydipsia, polyuria, or new focal neuro s/s.          Wt Readings from Last 3 Encounters:  11/26/21 166 lb (75.3 kg)  08/20/21 170 lb 3.2 oz (77.2 kg)  08/12/21 165 lb (74.8 kg)   BP Readings from Last 3 Encounters:  11/26/21 (!) 158/82  09/27/21 (!) 159/91  08/20/21 (!) 164/92         Past Medical History:  Diagnosis Date   Arthritis    CKD (chronic kidney disease), stage III ([place])    Depression 05/06/2011   Enlarged prostate    Former consumption of alcohol    GERD (gastroesophageal reflux disease) 12/02/2012   patient denies this dx    History of kidney stones    passed stone   Hyperlipidemia 05/06/2011   Hypersomnia 05/06/2011   Hypotension    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[place]) 05/06/2011   Memory loss 07/12/2012   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   Skin cancer 05/06/2011   Syncope    orthostatic syncope, no problems in tha last 3-4 months   Past Surgical History:  Procedure Laterality Date   BASAL CELL CARCINOMA EXCISION N/A 03/15/2018   Procedure: RE [place];  Surgeon: Irene Limbo, MD;  Location: [facility];  Service: Plastics;  Laterality: N/A;   CARDIAC CATHETERIZATION  02/28/2019   COLONOSCOPY  2013   deep brain stimulation     Parkinson's disease    LEFT HEART CATH AND CORONARY ANGIOGRAPHY N/A 02/28/2019   Procedure: LEFT HEART CATH AND CORONARY ANGIOGRAPHY;  Surgeon: Nelva Bush, MD;  Location: [facility];  Service: Cardiovascular;  Laterality: N/A;   MASS EXCISION N/A 02/12/2018   Procedure: excision lesion back, layered closure 15 cm;  Surgeon: Irene Limbo, MD;  Location: [facility];  Service: Plastics;  Laterality: N/A;   SKIN SPLIT GRAFT Left 03/15/2018   Procedure: SKIN GRAFT FROM RIGHT OR LEFT THIGH;  Surgeon: Irene Limbo, MD;  Location: [facility];  Service: Plastics;  Laterality: Left;   SUBTHALAMIC STIMULATOR BATTERY REPLACEMENT N/A 09/01/2012   Procedure: SUBTHALAMIC STIMULATOR BATTERY REPLACEMENT;  Surgeon: Erline Levine, MD;  Location: [facility];  Service: Neurosurgery;  Laterality: N/A;  Deep Brain Stimulator battery change   SUBTHALAMIC STIMULATOR BATTERY REPLACEMENT N/A 07/19/2015   Procedure: Deep Brain stimulator battery replacement;  Surgeon: Erline Levine, MD;  Location: [facility];  Service: Neurosurgery;  Laterality: N/A;  Deep Brain stimulator battery replacement   SUBTHALAMIC STIMULATOR BATTERY REPLACEMENT N/A 01/20/2019   Procedure: Deep brain stimulator battery change;  Surgeon: Erline Levine, MD;  Location: [facility];  Service: Neurosurgery;  Laterality: N/A;  Deep brain stimulator battery change   TONSILLECTOMY  As a child    reports that he has never smoked. He has never used smokeless tobacco. He reports that he does not drink alcohol and does not use drugs. family history is not on file. Allergies  Allergen Reactions   Statins     Makes parkinson worse, blood pressure drop   Current Outpatient Medications on File Prior to Visit  Medication Sig Dispense Refill   aspirin EC 81 MG tablet Take 1 tablet (81 mg total) by mouth daily. 90 tablet 3   carbidopa-levodopa (SINEMET IR) 25-100 MG tablet Take 1 tablet by mouth 6 (six) times daily. Taking 1 tablet every  2-3 hours daily     Cholecalciferol (VITAMIN D3) 50 MCG (2000 UT) TABS Take 50 mcg by mouth daily.     dextromethorphan (DELSYM) 30 MG/5ML liquid Take by mouth.     diclofenac (VOLTAREN) 50 MG EC tablet      ezetimibe (ZETIA) 10 MG tablet TAKE 1 TABLET BY MOUTH EVERY DAY 90 tablet 2   finasteride (PROSCAR) 5 MG tablet = 1 Tab, ORAL, QEvening, 0 Refill(s), Maintenance     fludrocortisone (FLORINEF) 0.1 MG tablet Take by mouth daily.     midodrine (PROAMATINE) 5 MG tablet TAKE 1 TABLET BY MOUTH 3 TIMES DAILY WITH MEALS. 270 tablet 3   mirabegron ER (MYRBETRIQ) 50 MG TB24 tablet Take 1 tablet (50 mg total) by mouth daily. 90 tablet 3   rivastigmine (EXELON) 9.5 mg/24hr 1 Patch, TOP, DAILY, 0 Refill(s), Maintenance     rosuvastatin (CRESTOR) 5 MG tablet TAKE 1 TABLET (5 MG TOTAL) BY MOUTH DAILY. 90 tablet 3   triamcinolone cream (KENALOG) 0.1 % Apply 1 application. topically 2 (two) times daily.     lidocaine (LIDODERM) 5 % Place 1 patch onto the skin daily. Remove & Discard patch within 12 hours or as directed by MD (Patient not taking: Reported on 11/26/2021) 5 patch 0   oxyCODONE (ROXICODONE) 5 MG immediate release tablet Take 1 tablet (5 mg total) by mouth every 4 (four) hours as needed for severe pain. (Patient not taking: Reported on 11/26/2021) 6 tablet 0   oxyCODONE-acetaminophen (PERCOCET/ROXICET) 5-325 MG tablet Take 1 tablet by mouth every 6 (six) hours as needed for severe pain. (Patient not taking: Reported on 11/26/2021) 15 tablet 0   No current facility-administered medications on file prior to visit.        ROS:  All others reviewed and negative.  Objective        PE:  BP (!) 158/82 (BP Location: Right Arm, Patient Position: Sitting, Cuff Size: Large)   Pulse 62   Temp 98.1 F (36.7 C) (Oral)   Ht '5\' 10"'$  (1.778 m)   Wt 166 lb (75.3 kg)   SpO2 97%   BMI 23.82 kg/m                 Constitutional: Pt appears in NAD               HENT: Head: NCAT.                Right Ear:  External ear normal.                 Left Ear: External ear normal.                Eyes: . Pupils are equal, round, and reactive to light. Conjunctivae and EOM are normal  Nose: without d/c or deformity               Neck: Neck supple. Gross normal ROM               Cardiovascular: Normal rate and regular rhythm.                 Pulmonary/Chest: Effort normal and breath sounds without rales or wheezing.                Abd:  Soft, NT, ND, + BS, no organomegaly               Neurological: Pt is alert. At baseline orientation, motor grossly intact               Skin: Skin is warm. No rashes, no other new lesions, LE edema - none; left shoulder diffuse nontender               Psychiatric: Pt behavior is normal without agitation   Micro: none  Cardiac tracings I have personally interpreted today:  none  Pertinent Radiological findings (summarize): none   Lab Results  Component Value Date   WBC 7.9 08/20/2021   HGB 11.7 (L) 08/20/2021   HCT 34.5 (L) 08/20/2021   PLT 250.0 08/20/2021   GLUCOSE 84 08/20/2021   CHOL 126 08/17/2020   TRIG 123.0 08/17/2020   HDL 45.20 08/17/2020   LDLDIRECT 76.0 06/06/2019   LDLCALC 56 08/17/2020   ALT 6 08/20/2021   AST 18 08/20/2021   NA 138 08/20/2021   K 5.3 (H) 08/20/2021   CL 104 08/20/2021   CREATININE 3.11 (H) 08/20/2021   BUN 46 (H) 08/20/2021   CO2 26 08/20/2021   TSH 2.68 08/17/2020   PSA 3.29 08/17/2020   HGBA1C 5.6 08/17/2020   Assessment/Plan:  AMARE BAIL is a 71 y.o. White or Caucasian [1] male with  has a past medical history of Arthritis, CKD (chronic kidney disease), stage III ([place]), Depression (05/06/2011), Enlarged prostate, Former consumption of alcohol, GERD (gastroesophageal reflux disease) (12/02/2012), History of kidney stones, Hyperlipidemia (05/06/2011), Hypersomnia (05/06/2011), Hypotension, 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[place]) (05/06/2011), Memory loss (07/12/2012), Pseudobulbar affect (05/06/2011), Skin cancer  (05/06/2011), and Syncope.  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[place]) Also for f/u neurology as planned  Neurologic gait dysfunction With recent falls, for PT eval post aug 30 per wife request  Left shoulder pain ? Frozen shoulder vs other - for sport medicine follow up  CKD (chronic kidney disease) stage 3, GFR 30-59 ml/min Lab Results  Component Value Date   CREATININE 3.11 (H) 08/20/2021   Stable overall, cont to avoid nephrotoxins   Orthostatic hypotension With BP mild elevated today having just taken the midodrine after low BP with sbp 80 per wife  Followup: Return in about 6 months (around 05/29/2022).  John Cower, MD 11/26/2021 9:32 PM [facility]

## 2021-11-26 NOTE — Assessment & Plan Note (Signed)
Also for f/u neurology as planned

## 2021-11-26 NOTE — Patient Instructions (Signed)
Please continue all other medications as before, and refills have been done if requested.  Please have the pharmacy call with any other refills you may need.  Please continue your efforts at being more active, low cholesterol diet, and weight control.  Please keep your appointments with your specialists as you may have planned - kidney doctor soon with labs, so we can hold on lab testing today  You will be contacted regarding the referral for: Physical Therapy for after Aug 30  Please also see Sports medicine for the left shoulder on the first floor  Please make an Appointment to return in 6 months, or sooner if needed

## 2021-11-26 NOTE — Assessment & Plan Note (Signed)
?   Frozen shoulder vs other - for sport medicine follow up

## 2021-11-26 NOTE — Assessment & Plan Note (Signed)
Lab Results  Component Value Date   CREATININE 3.11 (H) 08/20/2021   Stable overall, cont to avoid nephrotoxins

## 2021-11-26 NOTE — Assessment & Plan Note (Signed)
With recent falls, for PT eval post aug 30 per wife request

## 2021-11-26 NOTE — Assessment & Plan Note (Signed)
With BP mild elevated today having just taken the midodrine after low BP with sbp 80 per wife

## 2021-11-27 DIAGNOSIS — N184 Chronic kidney disease, stage 4 (severe): Secondary | ICD-10-CM | POA: Diagnosis not present

## 2021-11-27 DIAGNOSIS — R399 Unspecified symptoms and signs involving the genitourinary system: Secondary | ICD-10-CM | POA: Diagnosis not present

## 2021-11-27 DIAGNOSIS — D509 Iron deficiency anemia, unspecified: Secondary | ICD-10-CM | POA: Diagnosis not present

## 2021-11-27 DIAGNOSIS — E872 Acidosis, unspecified: Secondary | ICD-10-CM | POA: Diagnosis not present

## 2021-11-27 DIAGNOSIS — G20 Parkinson's disease: Secondary | ICD-10-CM | POA: Diagnosis not present

## 2021-11-27 DIAGNOSIS — N200 Calculus of kidney: Secondary | ICD-10-CM | POA: Diagnosis not present

## 2021-11-27 DIAGNOSIS — I951 Orthostatic hypotension: Secondary | ICD-10-CM | POA: Diagnosis not present

## 2021-11-27 DIAGNOSIS — R0989 Other specified symptoms and signs involving the circulatory and respiratory systems: Secondary | ICD-10-CM | POA: Diagnosis not present

## 2021-11-27 DIAGNOSIS — G903 Multi-system degeneration of the autonomic nervous system: Secondary | ICD-10-CM | POA: Diagnosis not present

## 2021-11-27 NOTE — Progress Notes (Unsigned)
I, John Herring, LAT, ATC acting as a scribe for John Leader, MD.  Subjective:    CC: L shoulder pain  HPI: Pt is a 71 y/o male c/o L shoulder pain. Pt was seen at the [facility] ED on 09/27/21 c/o worsening L shoulder pain that he hit on something a few days prior. Pt locates pain to anterior shoulder pain that radiates down the arm had an xray done in may or June, pain has been going on for about 6 months had a fall in April , no numbness or tingling just pain , has decreased ROM , has been taking tylenol for the pain and it doesn't seem to be working, does have painful popping, not able to lift object, ER thinks frozen shoulder  Neck pain: Radiates:  UE Numbness/tingling: UE Weakness: Aggravates: Treatments tried:  Dx imaging: 09/26/21 L shoulder XR  Pertinent review of Systems: No fevers or chills  Relevant historical information: Parkinson's disease and dementia.   Objective:    Vitals:   11/28/21 0918  BP: 130/80  Pulse: 72  SpO2: 100%   General: Frail appearing elderly male seated in a wheelchair.  Tremor present with motion.  MSK: Left shoulder: Normal-appearing nontender. Decreased range of motion abduction limited to 100 degrees active and passive.  External rotation full internal rotation posterior iliac crest. Strength difficult to assess but 4/5 to abduction 4/5 external rotation 5/5 internal rotation. Crepitation present palpated will with shoulder range of motion and strength testing.  Lab and Radiology Results  Procedure: Real-time Ultrasound Guided Injection of left shoulder glenohumeral joint posterior approach Device: Philips Affiniti 50G Images permanently stored and available for review in PACS Verbal informed consent obtained.  Discussed risks and benefits of procedure. Warned about infection, bleeding, hyperglycemia damage to structures among others. Patient expresses understanding and agreement Time-out conducted.   Noted no overlying  erythema, induration, or other signs of local infection.   Skin prepped in a sterile fashion.   Local anesthesia: Topical Ethyl chloride.   With sterile technique and under real time ultrasound guidance: 40 mg of Kenalog and 2 mL of Marcaine injected into glenohumeral joint. Fluid seen entering the joint capsule.   Completed without difficulty   Pain immediately resolved suggesting accurate placement of the medication.   Advised to call if fevers/chills, erythema, induration, drainage, or persistent bleeding.   Images permanently stored and available for review in the ultrasound unit.  Impression: Technically successful ultrasound guided injection.   X-ray images left shoulder obtained today personally and independently interpreted 3 view shoulder x-ray images and wheelchair.  Arthritis changes glenohumeral joint are present but difficult to fully assess.  No acute fractures. Await formal radiology review    Impression and Recommendations:    Assessment and Plan: 71 y.o. male with chronic left shoulder pain.  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  Unfortunately due to his other medical conditions including Parkinson's disease and dementia he is not a good surgical candidate for rotator cuff repair or shoulder replacement.   Plan for glenohumeral injection and trial of home health physical therapy.. Recommend Tylenol as well for medication management.  Recommend avoiding high-dose oral NSAIDs.  PDMP not reviewed this encounter. Orders Placed This Encounter  Procedures   [place] LIMITED JOINT SPACE STRUCTURES UP LEFT(NO LINKED CHARGES)    Order Specific Question:   Reason for Exam (SYMPTOM  OR DIAGNOSIS REQUIRED)    Answer:   left  shoulder pain    Order Specific Question:   Preferred imaging location?    Answer:   [facility]   DG Shoulder Left    Standing Status:   Future    Number of  Occurrences:   1    Standing Expiration Date:   12/29/2021    Order Specific Question:   Reason for Exam (SYMPTOM  OR DIAGNOSIS REQUIRED)    Answer:   left shoulder pain    Order Specific Question:   Preferred imaging location?    Answer:   [facility]   Ambulatory referral to [facility]    Referral Priority:   Routine    Referral Type:   [facility]    Referral Reason:   Specialty Services Required    Requested Specialty:   [place]    Number of Visits Requested:   1   No orders of the defined types were placed in this encounter.   Discussed warning signs or symptoms. Please see discharge instructions. Patient expresses understanding.   The above documentation has been reviewed and is accurate and complete John Herring, M.D.

## 2021-11-28 ENCOUNTER — Ambulatory Visit (INDEPENDENT_AMBULATORY_CARE_PROVIDER_SITE_OTHER): Payer: Medicare PPO

## 2021-11-28 ENCOUNTER — Ambulatory Visit: Payer: Medicare PPO | Admitting: Family Medicine

## 2021-11-28 ENCOUNTER — Ambulatory Visit: Payer: Self-pay

## 2021-11-28 VITALS — BP 130/80 | HR 72 | Ht 70.0 in | Wt 166.0 lb

## 2021-11-28 DIAGNOSIS — G8929 Other chronic pain: Secondary | ICD-10-CM | POA: Diagnosis not present

## 2021-11-28 DIAGNOSIS — M25512 Pain in left shoulder: Secondary | ICD-10-CM

## 2021-11-28 DIAGNOSIS — M19012 Primary osteoarthritis, left shoulder: Secondary | ICD-10-CM | POA: Diagnosis not present

## 2021-11-28 NOTE — Patient Instructions (Addendum)
Good to see you   I've placed a referral for [facility].   Please get an Xray today before you leave   You received an injection today. Seek immediate medical attention if the joint becomes red, extremely painful, or is oozing fluid.   Check back as needed

## 2021-11-28 NOTE — Progress Notes (Signed)
Left shoulder x-ray shows some mild arthritis changes.

## 2022-01-06 ENCOUNTER — Telehealth: Payer: Self-pay

## 2022-01-06 DIAGNOSIS — R350 Frequency of micturition: Secondary | ICD-10-CM

## 2022-01-06 NOTE — Telephone Encounter (Signed)
Ok order is done.

## 2022-01-06 NOTE — Telephone Encounter (Signed)
Pt wife states that the pt thinks he may have a UTI with the frequency and have collected a sample and was wanting to take it to the lab. I advise the pt wife that an order had to be place and that I would call her  back and let her know if and when Dr. Jenny Reichmann puts the order in.  Please advise

## 2022-01-07 NOTE — Telephone Encounter (Signed)
I called and made the wife aware that the order has been placed.

## 2022-01-08 ENCOUNTER — Other Ambulatory Visit (INDEPENDENT_AMBULATORY_CARE_PROVIDER_SITE_OTHER): Payer: Medicare PPO

## 2022-01-08 DIAGNOSIS — R350 Frequency of micturition: Secondary | ICD-10-CM

## 2022-01-08 LAB — URINALYSIS, ROUTINE W REFLEX MICROSCOPIC
Bilirubin Urine: NEGATIVE
Hgb urine dipstick: NEGATIVE
Ketones, ur: NEGATIVE
Leukocytes,Ua: NEGATIVE
Nitrite: NEGATIVE
RBC / HPF: NONE SEEN (ref 0–?)
Specific Gravity, Urine: 1.015 (ref 1.000–1.030)
Total Protein, Urine: 30 — AB
Urine Glucose: NEGATIVE
Urobilinogen, UA: 0.2 (ref 0.0–1.0)
pH: 6.5 (ref 5.0–8.0)

## 2022-01-09 LAB — URINE CULTURE

## 2022-01-13 ENCOUNTER — Telehealth: Payer: Self-pay

## 2022-01-13 NOTE — Telephone Encounter (Signed)
John Herring is calling [facility] fell in bathroom last night, no injuries other than a scratch on the knee.

## 2022-01-19 ENCOUNTER — Ambulatory Visit (HOSPITAL_COMMUNITY)
Admission: EM | Admit: 2022-01-19 | Discharge: 2022-01-19 | Disposition: A | Discharge status: Other Acute Inpatient Hospital | Payer: Medicare PPO

## 2022-01-19 ENCOUNTER — Emergency Department (HOSPITAL_COMMUNITY): Payer: Medicare PPO

## 2022-01-19 ENCOUNTER — Encounter (HOSPITAL_COMMUNITY): Payer: Self-pay | Admitting: Emergency Medicine

## 2022-01-19 ENCOUNTER — Other Ambulatory Visit: Payer: Self-pay

## 2022-01-19 ENCOUNTER — Emergency Department (HOSPITAL_COMMUNITY)
Admission: EM | Admit: 2022-01-19 | Discharge: 2022-01-20 | Disposition: A | Discharge status: Home / Self Care | Payer: Medicare PPO | Attending: Emergency Medicine | Admitting: Emergency Medicine

## 2022-01-19 ENCOUNTER — Encounter (HOSPITAL_COMMUNITY): Payer: Self-pay | Admitting: *Deleted

## 2022-01-19 DIAGNOSIS — M4312 Spondylolisthesis, cervical region: Secondary | ICD-10-CM | POA: Diagnosis not present

## 2022-01-19 DIAGNOSIS — D649 Anemia, unspecified: Secondary | ICD-10-CM | POA: Diagnosis not present

## 2022-01-19 DIAGNOSIS — N289 Disorder of kidney and ureter, unspecified: Secondary | ICD-10-CM | POA: Insufficient documentation

## 2022-01-19 DIAGNOSIS — S0101XA Laceration without foreign body of scalp, initial encounter: Secondary | ICD-10-CM | POA: Insufficient documentation

## 2022-01-19 DIAGNOSIS — I10 Essential (primary) hypertension: Secondary | ICD-10-CM | POA: Diagnosis not present

## 2022-01-19 DIAGNOSIS — G20C Parkinsonism, unspecified: Secondary | ICD-10-CM | POA: Diagnosis not present

## 2022-01-19 DIAGNOSIS — Z7982 Long term (current) use of aspirin: Secondary | ICD-10-CM | POA: Diagnosis not present

## 2022-01-19 DIAGNOSIS — W01198A Fall on same level from slipping, tripping and stumbling with subsequent striking against other object, initial encounter: Secondary | ICD-10-CM | POA: Diagnosis not present

## 2022-01-19 DIAGNOSIS — R55 Syncope and collapse: Secondary | ICD-10-CM

## 2022-01-19 DIAGNOSIS — S0990XA Unspecified injury of head, initial encounter: Secondary | ICD-10-CM | POA: Diagnosis not present

## 2022-01-19 DIAGNOSIS — Y92009 Unspecified place in unspecified non-institutional (private) residence as the place of occurrence of the external cause: Secondary | ICD-10-CM

## 2022-01-19 DIAGNOSIS — R42 Dizziness and giddiness: Secondary | ICD-10-CM

## 2022-01-19 DIAGNOSIS — W19XXXA Unspecified fall, initial encounter: Secondary | ICD-10-CM

## 2022-01-19 LAB — CBC WITH DIFFERENTIAL/PLATELET
Abs Immature Granulocytes: 0.02 10*3/uL (ref 0.00–0.07)
Basophils Absolute: 0.1 10*3/uL (ref 0.0–0.1)
Basophils Relative: 1 %
Eosinophils Absolute: 0.1 10*3/uL (ref 0.0–0.5)
Eosinophils Relative: 2 %
HCT: 31.3 % — ABNORMAL LOW (ref 39.0–52.0)
Hemoglobin: 10.1 g/dL — ABNORMAL LOW (ref 13.0–17.0)
Immature Granulocytes: 0 %
Lymphocytes Relative: 21 %
Lymphs Abs: 1.5 10*3/uL (ref 0.7–4.0)
MCH: 31 pg (ref 26.0–34.0)
MCHC: 32.3 g/dL (ref 30.0–36.0)
MCV: 96 fL (ref 80.0–100.0)
Monocytes Absolute: 0.7 10*3/uL (ref 0.1–1.0)
Monocytes Relative: 10 %
Neutro Abs: 4.7 10*3/uL (ref 1.7–7.7)
Neutrophils Relative %: 66 %
Platelets: 213 10*3/uL (ref 150–400)
RBC: 3.26 MIL/uL — ABNORMAL LOW (ref 4.22–5.81)
RDW: 14.6 % (ref 11.5–15.5)
WBC: 7.2 10*3/uL (ref 4.0–10.5)
nRBC: 0 % (ref 0.0–0.2)

## 2022-01-19 LAB — BASIC METABOLIC PANEL
Anion gap: 11 (ref 5–15)
BUN: 40 mg/dL — ABNORMAL HIGH (ref 8–23)
CO2: 20 mmol/L — ABNORMAL LOW (ref 22–32)
Calcium: 8.9 mg/dL (ref 8.9–10.3)
Chloride: 108 mmol/L (ref 98–111)
Creatinine, Ser: 2.92 mg/dL — ABNORMAL HIGH (ref 0.61–1.24)
GFR, Estimated: 22 mL/min — ABNORMAL LOW (ref >60)
Glucose, Bld: 113 mg/dL — ABNORMAL HIGH (ref 70–99)
Potassium: 4.2 mmol/L (ref 3.5–5.1)
Sodium: 139 mmol/L (ref 135–145)

## 2022-01-19 LAB — CBG MONITORING, ED: Glucose-Capillary: 106 mg/dL — ABNORMAL HIGH (ref 70–99)

## 2022-01-19 MED ORDER — CARBIDOPA-LEVODOPA ER 25-100 MG PO TBCR
1.0000 | EXTENDED_RELEASE_TABLET | Freq: Once | ORAL | Status: AC
  Administered 2022-01-19: 1 via ORAL
  Filled 2022-01-19: qty 1

## 2022-01-19 NOTE — ED Notes (Addendum)
Report called to [facility] [place] ed triage .

## 2022-01-19 NOTE — ED Triage Notes (Signed)
Patient here after a fall earlier today causing him to fall and hit the back of his head against a chair. No thinners, patient is alert and in no apparent distress at this time. Bleeding controlled

## 2022-01-19 NOTE — ED Provider Triage Note (Signed)
Emergency Medicine Provider Triage Evaluation Note  John Herring , a 71 y.o. male  was evaluated in triage.  Pt complains of syncopal episode followed by a fall.  Patient with reported history of multiple episodes of orthostatic hypotension.  Patient was standing and it reportedly got lightheaded, fell, hit his back of his head on a chair.  1-1/2 to 2 cm laceration noted to back of head.  Patient is not on blood thinners  Review of Systems  Positive: As above Negative: As above  Physical Exam  BP (!) 171/96 (BP Location: Right Arm)   Pulse 73   Temp 98.9 F (37.2 C) (Oral)   Resp 16   SpO2 98%  Gen:   Awake, no distress   Resp:  Normal effort  MSK:   Moves extremities without difficulty  Other:    Medical Decision Making  Medically screening exam initiated at 5:31 PM.  Appropriate orders placed.  [name] was informed that the remainder of the evaluation will be completed by another provider, this initial triage assessment does not replace that evaluation, and the importance of remaining in the ED until their evaluation is complete.     [name], PA-C 01/19/22 1732

## 2022-01-19 NOTE — ED Triage Notes (Signed)
Lac to scalp after a syncope fall . Pt hit the back of his head on a chair.Marland Kitchen Pt A/O answering questions appropriately . Pt has a Hx of syncope with falls. Pt has stage 4 kidney DX. And parkinson"s dx.the patient has a Lac to the back of his head and bleeding controlled in triage .

## 2022-01-19 NOTE — ED Provider Notes (Signed)
Patient presents to urgent care after falling and hitting his head on a chair in the dining room approximately 10 minutes prior to arrival urgent care.  He has a vertical laceration to the back of his head that is approximately 5 cm in length.  Bleeding is controlled at this time.  Patient does not take blood thinning medications but does take 1 baby aspirin per day for cardiac prevention.  He has a significant medical history of Parkinson's disease, vasovagal syncope, and orthostatic hypotension.  Patient's wife states she has been giving him his midodrine medication as needed per doctor's orders based on blood pressure readings at home and has had to give him 30 mg of midodrine today to keep his blood pressure up enough for him to not become dizzy.  Prior to falling, patient states that he became dizzy, nauseous, and passed out hitting his head.  When he woke up, he was on the floor looking at his wife who was attempting to help him off of the floor.  He denies chest pain, shortness of breath, and dizziness at this time.  He is not diabetic and recently ate lunch at 12 PM.  He is alert and oriented x4 with a stable neurologic exam in the clinic despite tremor to baseline due to Parkinson's disease.  He is complaining of bilateral eye pain and headache related to fall hitting his head.  Denies vomiting after hitting his head.  Wife states that he had a viral gastrointestinal illness 3 days ago where he had multiple episodes of diarrhea and has been attempting to drink plenty of water since then but she believes he may be very dehydrated.  Recommend patient go to the nearest emergency department due to syncope, dizziness, and fall hitting his head for further evaluation.  Patient's vital signs and neurologic exam are stable for him to go to the hospital with his wife at this time.  Patient and wife express understanding and agreement with plan.   Talbot Grumbling, [place] 01/19/22 1807

## 2022-01-20 NOTE — ED Provider Notes (Signed)
[facility] EMERGENCY DEPARTMENT Provider Note   CSN: [id number] Arrival date & time: 01/19/22  1703     History  Chief Complaint  Patient presents with   Head Laceration    John Herring is a 71 y.o. male.  The history is provided by the patient and the spouse.  Head Laceration  He has history of hyperlipidemia, orthostatic hypotension, Parkinson's disease and fell at home when he stood up and suffered a laceration to his scalp when he hit his head on a chair.  There is no loss of consciousness.  He denies other injury.  He is up-to-date on tetanus immunizations.  He is not on any anticoagulasts, but he does take low-dose aspirin.   Home Medications Prior to Admission medications   Medication Sig Start Date End Date Taking? Authorizing Provider  aspirin EC 81 MG tablet Take 1 tablet (81 mg total) by mouth daily. 02/21/19   Jerline Pain, MD  carbidopa-levodopa (SINEMET IR) 25-100 MG tablet Take 1 tablet by mouth 6 (six) times daily. Taking 1 tablet every 2-3 hours daily    [provider]  Cholecalciferol (VITAMIN D3) 50 MCG (2000 UT) TABS Take 50 mcg by mouth daily.    [provider]  dextromethorphan (DELSYM) 30 MG/5ML liquid Take by mouth.    [provider]  diclofenac (VOLTAREN) 50 MG EC tablet  01/19/20   [provider]  ezetimibe (ZETIA) 10 MG tablet TAKE 1 TABLET BY MOUTH EVERY DAY 11/07/21   Biagio Borg, MD  finasteride (PROSCAR) 5 MG tablet = 1 Tab, ORAL, QEvening, 0 Refill(s), Maintenance 05/13/20   [provider]  fludrocortisone (FLORINEF) 0.1 MG tablet Take by mouth daily. 05/18/21   [provider]  lidocaine (LIDODERM) 5 % Place 1 patch onto the skin daily. Remove & Discard patch within 12 hours or as directed by MD 09/27/21   Maudie Flakes, MD  midodrine (PROAMATINE) 5 MG tablet TAKE 1 TABLET BY MOUTH 3 TIMES DAILY WITH MEALS. 09/24/21   Biagio Borg, MD  mirabegron ER (MYRBETRIQ) 50 MG TB24  tablet Take 1 tablet (50 mg total) by mouth daily. 08/20/21   Biagio Borg, MD  oxyCODONE (ROXICODONE) 5 MG immediate release tablet Take 1 tablet (5 mg total) by mouth every 4 (four) hours as needed for severe pain. 09/27/21   Maudie Flakes, MD  oxyCODONE-acetaminophen (PERCOCET/ROXICET) 5-325 MG tablet Take 1 tablet by mouth every 6 (six) hours as needed for severe pain. 08/13/21   Horton, Barbette Hair, MD  rivastigmine (EXELON) 9.5 mg/24hr 1 Patch, TOP, DAILY, 0 Refill(s), Maintenance 12/04/20   [provider]  rosuvastatin (CRESTOR) 5 MG tablet TAKE 1 TABLET (5 MG TOTAL) BY MOUTH DAILY. 08/05/21   Jerline Pain, MD  triamcinolone cream (KENALOG) 0.1 % Apply 1 application. topically 2 (two) times daily. 07/02/21   [provider]      Allergies    Statins    Review of Systems   Review of Systems  All other systems reviewed and are negative.   Physical Exam Updated Vital Signs BP (!) 127/100 (BP Location: Right Arm)   Pulse 77   Temp 98.9 F (37.2 C) (Oral)   Resp 15   SpO2 98%  Physical Exam Vitals and nursing note reviewed.   71 year old male, resting comfortably and in no acute distress. Vital signs are significant for elevated diastolic blood pressure. Oxygen saturation is 98%, which is normal. Head  is normocephalic.  Linear scalp laceration noted on the occiput, oriented and vertically. PERRLA, EOMI. Oropharynx is clear. Neck is nontender adenopathy or JVD. Back is nontender and there is no CVA tenderness. Lungs are clear without rales, wheezes, or rhonchi. Chest is nontender. Heart has regular rate and rhythm without murmur. Abdomen is soft, flat, nontender without masses or hepatosplenomegaly and peristalsis is normoactive. Extremities have no cyanosis or edema, full range of motion is present. Skin is warm and dry without rash. Neurologic: Mental status is normal, cranial nerves are intact, moves all extremities equally, resting tremor noted.  ED Results  / Procedures / Treatments   Labs (all labs ordered are listed, but only abnormal results are displayed) Labs Reviewed  BASIC METABOLIC PANEL - Abnormal; Notable for the following components:      Result Value   CO2 20 (*)    Glucose, Bld 113 (*)    BUN 40 (*)    Creatinine, Ser 2.92 (*)    GFR, Estimated 22 (*)    All other components within normal limits  CBC WITH DIFFERENTIAL/PLATELET - Abnormal; Notable for the following components:   RBC 3.26 (*)    Hemoglobin 10.1 (*)    HCT 31.3 (*)    All other components within normal limits  CBG MONITORING, ED - Abnormal; Notable for the following components:   Glucose-Capillary 106 (*)    All other components within normal limits    EKG None  Radiology CT Head Wo Contrast  Result Date: 01/19/2022 CLINICAL DATA:  Head trauma, minor (Age >= 65y); Neck trauma (Age >= 65y) EXAM: CT HEAD WITHOUT CONTRAST CT CERVICAL SPINE WITHOUT CONTRAST TECHNIQUE: Multidetector CT imaging of the head and cervical spine was performed following the standard protocol without intravenous contrast. Multiplanar CT image reconstructions of the cervical spine were also generated. RADIATION DOSE REDUCTION: This exam was performed according to the departmental dose-optimization program which includes automated exposure control, adjustment of the mA and/or kV according to patient size and/or use of iterative reconstruction technique. COMPARISON:  None Available. FINDINGS: CT HEAD FINDINGS Brain: Slightly limited evaluation due to streak artifact originating from the neural stimulator. Bilateral mid brain stimulators in grossly appropriate position. Cerebral ventricle sizes are concordant with the degree of cerebral volume loss. No evidence of large-territorial acute infarction. No parenchymal hemorrhage. No mass lesion. No extra-axial collection. No mass effect or midline shift. No hydrocephalus. Basilar cisterns are patent. Vascular: No hyperdense vessel. Skull: No acute  fracture or focal lesion. Sinuses/Orbits: Paranasal sinuses and mastoid air cells are clear. The orbits are unremarkable. Other: None. CT CERVICAL SPINE FINDINGS Alignment: Exaggerated cervical lordosis centered at the C4 level likely due to degenerative changes as well as positioning. Mild retrolisthesis of C3 on C4. Grade 1 anterolisthesis of C4 on C5. Grade 1 anterolisthesis C5 on C6. Grade 1 anterolisthesis of C6 on C7. Grade 1 anterolisthesis of C7 on T1. Skull base and vertebrae: Multilevel moderate to severe degenerative changes. Associated multilevel moderate to severe osseous neural foraminal stenosis. Likely old healed fracture of the right C4-C5 level. No acute fracture. No aggressive appearing focal osseous lesion or focal pathologic process. Soft tissues and spinal canal: No prevertebral fluid or swelling. No visible canal hematoma. Upper chest: Unremarkable. Other: None. IMPRESSION: 1. No acute intracranial abnormality. 2. 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 Electronically Signed  By: Iven Finn M.D.   On: 01/19/2022 19:10   CT Cervical Spine Wo Contrast  Result Date: 01/19/2022 CLINICAL DATA:  Head trauma, minor (Age >= 65y); Neck trauma (Age >= 65y) EXAM: CT HEAD WITHOUT CONTRAST CT CERVICAL SPINE WITHOUT CONTRAST TECHNIQUE: Multidetector CT imaging of the head and cervical spine was performed following the standard protocol without intravenous contrast. Multiplanar CT image reconstructions of the cervical spine were also generated. RADIATION DOSE REDUCTION: This exam was performed according to the departmental dose-optimization program which includes automated exposure control, adjustment of the mA and/or kV according to patient size and/or use of iterative reconstruction technique. COMPARISON:  None  Available. FINDINGS: CT HEAD FINDINGS Brain: Slightly limited evaluation due to streak artifact originating from the neural stimulator. Bilateral mid brain stimulators in grossly appropriate position. Cerebral ventricle sizes are concordant with the degree of cerebral volume loss. No evidence of large-territorial acute infarction. No parenchymal hemorrhage. No mass lesion. No extra-axial collection. No mass effect or midline shift. No hydrocephalus. Basilar cisterns are patent. Vascular: No hyperdense vessel. Skull: No acute fracture or focal lesion. Sinuses/Orbits: Paranasal sinuses and mastoid air cells are clear. The orbits are unremarkable. Other: None. CT CERVICAL SPINE FINDINGS Alignment: Exaggerated cervical lordosis centered at the C4 level likely due to degenerative changes as well as positioning. Mild retrolisthesis of C3 on C4. Grade 1 anterolisthesis of C4 on C5. Grade 1 anterolisthesis C5 on C6. Grade 1 anterolisthesis of C6 on C7. Grade 1 anterolisthesis of C7 on T1. Skull base and vertebrae: Multilevel moderate to severe degenerative changes. Associated multilevel moderate to severe osseous neural foraminal stenosis. Likely old healed fracture of the right C4-C5 level. No acute fracture. No aggressive appearing focal osseous lesion or focal pathologic process. Soft tissues and spinal canal: No prevertebral fluid or swelling. No visible canal hematoma. Upper chest: Unremarkable. Other: None. IMPRESSION: 1. No acute intracranial abnormality. 2. 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 Electronically Signed   By: Iven Finn M.D.   On: 01/19/2022 19:10    Procedures .Marland KitchenLaceration Repair  Date/Time: 01/20/2022 1:13 AM  Performed by: Delora Fuel, MD Authorized by: Delora Fuel, MD   Consent:    Consent obtained:   Verbal   Consent given by:  Patient   Risks, benefits, and alternatives were discussed: yes     Risks discussed:  Pain and retained foreign body   Alternatives discussed:  No treatment Universal protocol:    Procedure explained and questions answered to patient or proxy's satisfaction: yes     Relevant documents present and verified: yes     Test results available: yes     Imaging studies available: yes     Required blood products, implants, devices, and special equipment available: yes     Site/side marked: yes     Immediately prior to procedure, a time out was called: yes     Patient identity confirmed:  Verbally with patient and arm band Anesthesia:    Anesthesia method:  None Laceration details:    Location:  Scalp   Scalp location:  Occipital   Length (cm):  3   Depth (mm):  2 Pre-procedure details:    Preparation:  Patient was prepped and draped in usual sterile fashion and imaging obtained to evaluate for foreign bodies Exploration:    Limited defect created (wound extended): no  Hemostasis achieved with:  Direct pressure   Imaging obtained: x-ray     Imaging outcome: foreign body not noted     Wound exploration: entire depth of wound visualized     Wound extent: no foreign bodies/material noted     Contaminated: no   Treatment:    Area cleansed with:  Saline   Amount of cleaning:  Standard   Debridement:  None   Undermining:  None   Scar revision: no   Skin repair:    Repair method:  Staples   Number of staples:  5 Approximation:    Approximation:  Close Repair type:    Repair type:  Simple Post-procedure details:    Dressing:  Open (no dressing)   Procedure completion:  Tolerated well, no immediate complications     Medications Ordered in ED Medications  Carbidopa-Levodopa ER (SINEMET CR) 25-100 MG tablet controlled release 1 tablet (1 tablet Oral Given 01/19/22 2230)    ED Course/ Medical Decision Making/ A&P                           Medical  Decision Making  Scalp laceration.  CT scans of head and cervical spine showed no acute injury.  I have independently viewed the images, and agree with the radiologist's interpretation.  I have reviewed and interpreted his laboratory test, and my interpretation is stable renal insufficiency, stable anemia.  Laceration is closed with staples.  I am discharging him with instructions to have staples removed in 7-10 days.  I have reviewed his old records, and his last tetanus immunization was August 08, 2019.  Final Clinical Impression(s) / ED Diagnoses Final diagnoses:  Fall at home, initial encounter  Laceration of occipital region of scalp without complication, initial encounter  Renal insufficiency  Normochromic normocytic anemia    Rx / DC Orders ED Discharge Orders     None         Delora Fuel, MD 38/17/71 [phone]

## 2022-01-28 ENCOUNTER — Telehealth: Payer: Self-pay

## 2022-01-28 NOTE — Telephone Encounter (Signed)
John Herring called in to advise Dr.John that patient missed a visit as they are on vacation, will resume next week.

## 2022-02-04 ENCOUNTER — Telehealth: Payer: Self-pay

## 2022-02-04 NOTE — Telephone Encounter (Signed)
     Patient  visit on 01/20/2022  at [facility] was for Unspecified fall, initial encounter.  Have you been able to follow up with your primary care physician? Yes  The patient was or was not able to obtain any needed medicine or equipment. Patient obtained all meds.  Are there diet recommendations that you are having difficulty following? No  Patient expresses understanding of discharge instructions and education provided has no other needs at this time.    [facility] Guide   [email or web address]  ?? [phone]   Website: [email or web address]

## 2022-02-05 DIAGNOSIS — D0439 Carcinoma in situ of skin of other parts of face: Secondary | ICD-10-CM | POA: Diagnosis not present

## 2022-02-05 DIAGNOSIS — C4441 Basal cell carcinoma of skin of scalp and neck: Secondary | ICD-10-CM | POA: Diagnosis not present

## 2022-02-05 DIAGNOSIS — L570 Actinic keratosis: Secondary | ICD-10-CM | POA: Diagnosis not present

## 2022-02-05 DIAGNOSIS — Z85828 Personal history of other malignant neoplasm of skin: Secondary | ICD-10-CM | POA: Diagnosis not present

## 2022-02-05 DIAGNOSIS — C44319 Basal cell carcinoma of skin of other parts of face: Secondary | ICD-10-CM | POA: Diagnosis not present

## 2022-02-05 DIAGNOSIS — D485 Neoplasm of uncertain behavior of skin: Secondary | ICD-10-CM | POA: Diagnosis not present

## 2022-02-09 ENCOUNTER — Emergency Department (HOSPITAL_COMMUNITY): Payer: Medicare PPO

## 2022-02-09 ENCOUNTER — Other Ambulatory Visit: Payer: Self-pay

## 2022-02-09 ENCOUNTER — Encounter (HOSPITAL_COMMUNITY): Payer: Self-pay | Admitting: Emergency Medicine

## 2022-02-09 ENCOUNTER — Emergency Department (HOSPITAL_COMMUNITY)
Admission: EM | Admit: 2022-02-09 | Discharge: 2022-02-10 | Disposition: A | Discharge status: Home / Self Care | Payer: Medicare PPO | Attending: Emergency Medicine | Admitting: Emergency Medicine

## 2022-02-09 DIAGNOSIS — J984 Other disorders of lung: Secondary | ICD-10-CM | POA: Diagnosis not present

## 2022-02-09 DIAGNOSIS — M79632 Pain in left forearm: Secondary | ICD-10-CM | POA: Insufficient documentation

## 2022-02-09 DIAGNOSIS — R55 Syncope and collapse: Secondary | ICD-10-CM | POA: Diagnosis not present

## 2022-02-09 DIAGNOSIS — I129 Hypertensive chronic kidney disease with stage 1 through stage 4 chronic kidney disease, or unspecified chronic kidney disease: Secondary | ICD-10-CM | POA: Insufficient documentation

## 2022-02-09 DIAGNOSIS — D649 Anemia, unspecified: Secondary | ICD-10-CM | POA: Insufficient documentation

## 2022-02-09 DIAGNOSIS — G20C Parkinsonism, unspecified: Secondary | ICD-10-CM | POA: Diagnosis not present

## 2022-02-09 DIAGNOSIS — N189 Chronic kidney disease, unspecified: Secondary | ICD-10-CM | POA: Insufficient documentation

## 2022-02-09 DIAGNOSIS — Y9301 Activity, walking, marching and hiking: Secondary | ICD-10-CM | POA: Insufficient documentation

## 2022-02-09 DIAGNOSIS — I6523 Occlusion and stenosis of bilateral carotid arteries: Secondary | ICD-10-CM | POA: Diagnosis not present

## 2022-02-09 DIAGNOSIS — W01198A Fall on same level from slipping, tripping and stumbling with subsequent striking against other object, initial encounter: Secondary | ICD-10-CM | POA: Diagnosis not present

## 2022-02-09 DIAGNOSIS — Z7982 Long term (current) use of aspirin: Secondary | ICD-10-CM | POA: Insufficient documentation

## 2022-02-09 DIAGNOSIS — M25522 Pain in left elbow: Secondary | ICD-10-CM | POA: Insufficient documentation

## 2022-02-09 DIAGNOSIS — R6889 Other general symptoms and signs: Secondary | ICD-10-CM | POA: Diagnosis not present

## 2022-02-09 DIAGNOSIS — R918 Other nonspecific abnormal finding of lung field: Secondary | ICD-10-CM | POA: Diagnosis not present

## 2022-02-09 DIAGNOSIS — M25512 Pain in left shoulder: Secondary | ICD-10-CM | POA: Diagnosis not present

## 2022-02-09 DIAGNOSIS — S199XXA Unspecified injury of neck, initial encounter: Secondary | ICD-10-CM | POA: Diagnosis not present

## 2022-02-09 DIAGNOSIS — M19012 Primary osteoarthritis, left shoulder: Secondary | ICD-10-CM | POA: Diagnosis not present

## 2022-02-09 DIAGNOSIS — S0181XA Laceration without foreign body of other part of head, initial encounter: Secondary | ICD-10-CM | POA: Diagnosis not present

## 2022-02-09 DIAGNOSIS — S0990XA Unspecified injury of head, initial encounter: Secondary | ICD-10-CM | POA: Diagnosis not present

## 2022-02-09 DIAGNOSIS — I517 Cardiomegaly: Secondary | ICD-10-CM | POA: Diagnosis not present

## 2022-02-09 DIAGNOSIS — R404 Transient alteration of awareness: Secondary | ICD-10-CM | POA: Diagnosis not present

## 2022-02-09 DIAGNOSIS — M4312 Spondylolisthesis, cervical region: Secondary | ICD-10-CM | POA: Diagnosis not present

## 2022-02-09 DIAGNOSIS — Z743 Need for continuous supervision: Secondary | ICD-10-CM | POA: Diagnosis not present

## 2022-02-09 DIAGNOSIS — W19XXXA Unspecified fall, initial encounter: Secondary | ICD-10-CM | POA: Diagnosis not present

## 2022-02-09 LAB — CBC WITH DIFFERENTIAL/PLATELET
Abs Immature Granulocytes: 0.02 10*3/uL (ref 0.00–0.07)
Basophils Absolute: 0.1 10*3/uL (ref 0.0–0.1)
Basophils Relative: 1 %
Eosinophils Absolute: 0.1 10*3/uL (ref 0.0–0.5)
Eosinophils Relative: 2 %
HCT: 31.7 % — ABNORMAL LOW (ref 39.0–52.0)
Hemoglobin: 10 g/dL — ABNORMAL LOW (ref 13.0–17.0)
Immature Granulocytes: 0 %
Lymphocytes Relative: 29 %
Lymphs Abs: 1.9 10*3/uL (ref 0.7–4.0)
MCH: 30.7 pg (ref 26.0–34.0)
MCHC: 31.5 g/dL (ref 30.0–36.0)
MCV: 97.2 fL (ref 80.0–100.0)
Monocytes Absolute: 0.6 10*3/uL (ref 0.1–1.0)
Monocytes Relative: 9 %
Neutro Abs: 3.9 10*3/uL (ref 1.7–7.7)
Neutrophils Relative %: 59 %
Platelets: 235 10*3/uL (ref 150–400)
RBC: 3.26 MIL/uL — ABNORMAL LOW (ref 4.22–5.81)
RDW: 14.6 % (ref 11.5–15.5)
WBC: 6.7 10*3/uL (ref 4.0–10.5)
nRBC: 0 % (ref 0.0–0.2)

## 2022-02-09 MED ORDER — LIDOCAINE-EPINEPHRINE-TETRACAINE (LET) TOPICAL GEL
3.0000 mL | Freq: Once | TOPICAL | Status: AC
Start: 2022-02-09 — End: 2022-02-10
  Administered 2022-02-10: 3 mL via TOPICAL
  Filled 2022-02-09: qty 3

## 2022-02-09 NOTE — ED Notes (Signed)
Pt transported to CT ?

## 2022-02-09 NOTE — ED Triage Notes (Signed)
Pt bib [facility] after 2 witnessed syncopal episodes. Pt fell to floor and hit head after 2nd syncopal and has approx 2 inch lac to forehead. Hx of parkinson's and chronic hypotension. 326m NS given pta.   BP 98/60, HR 75, CBG 123

## 2022-02-09 NOTE — ED Provider Notes (Incomplete)
[facility] EMERGENCY DEPARTMENT Provider Note   CSN: [id number] Arrival date & time: 02/09/22  2257     History {Add pertinent medical, surgical, social history, OB history to HPI:1} Chief Complaint  Patient presents with  . Loss of Consciousness    John Herring is a 71 y.o. male with a hx of parkinson's disease, CKD, HTN, hyperlipidemia, orthostatic syncope, and prior VTE who presents to the ED via EMS S/p syncope tonight. Patient reports he was walking to the bathroom when his walker when he passed out, he tried to stand up and then passed out again. Reports head injury w/ mild headache. Also having LUE pain, reports chronic but feels worse since fall. Denies prodromal or current chest pain/dyspnea. Otherwise has been in his usual state of health today. Per EMS who discussed w/ family- patient w/ hx of recurrent syncope, usually occurs when he is seated, he takes midodrine for hypotension. Received 300 cc of fluid en route.   HPI     Home Medications Prior to Admission medications   Medication Sig Start Date End Date Taking? Authorizing Provider  aspirin EC 81 MG tablet Take 1 tablet (81 mg total) by mouth daily. 02/21/19   Jerline Pain, MD  carbidopa-levodopa (SINEMET IR) 25-100 MG tablet Take 1 tablet by mouth 6 (six) times daily. Taking 1 tablet every 2-3 hours daily    [provider]  Cholecalciferol (VITAMIN D3) 50 MCG (2000 UT) TABS Take 50 mcg by mouth daily.    [provider]  dextromethorphan (DELSYM) 30 MG/5ML liquid Take by mouth.    [provider]  diclofenac (VOLTAREN) 50 MG EC tablet  01/19/20   [provider]  ezetimibe (ZETIA) 10 MG tablet TAKE 1 TABLET BY MOUTH EVERY DAY 11/07/21   Biagio Borg, MD  finasteride (PROSCAR) 5 MG tablet = 1 Tab, ORAL, QEvening, 0 Refill(s), Maintenance 05/13/20   [provider]  fludrocortisone (FLORINEF) 0.1 MG tablet Take by mouth daily. 05/18/21   [provider]  lidocaine (LIDODERM) 5 % Place 1 patch onto the skin daily. Remove & Discard patch within 12 hours or as directed by MD 09/27/21   Maudie Flakes, MD  midodrine (PROAMATINE) 5 MG tablet TAKE 1 TABLET BY MOUTH 3 TIMES DAILY WITH MEALS. 09/24/21   Biagio Borg, MD  mirabegron ER (MYRBETRIQ) 50 MG TB24 tablet Take 1 tablet (50 mg total) by mouth daily. 08/20/21   Biagio Borg, MD  oxyCODONE (ROXICODONE) 5 MG immediate release tablet Take 1 tablet (5 mg total) by mouth every 4 (four) hours as needed for severe pain. 09/27/21   Maudie Flakes, MD  oxyCODONE-acetaminophen (PERCOCET/ROXICET) 5-325 MG tablet Take 1 tablet by mouth every 6 (six) hours as needed for severe pain. 08/13/21   Horton, Barbette Hair, MD  rivastigmine (EXELON) 9.5 mg/24hr 1 Patch, TOP, DAILY, 0 Refill(s), Maintenance 12/04/20   [provider]  rosuvastatin (CRESTOR) 5 MG tablet TAKE 1 TABLET (5 MG TOTAL) BY MOUTH DAILY. 08/05/21   Jerline Pain, MD  triamcinolone cream (KENALOG) 0.1 % Apply 1 application. topically 2 (two) times daily. 07/02/21   [provider]      Allergies    Statins    Review of Systems   Review of Systems  Constitutional:  Negative for chills and fever.  Respiratory:  Negative for shortness of breath.   Cardiovascular:  Negative for chest pain.  Gastrointestinal:  Negative for abdominal pain and  vomiting.  Musculoskeletal:  Positive for arthralgias.  Skin:  Positive for wound.  Neurological:  Positive for syncope and headaches.  All other systems reviewed and are negative.   Physical Exam Updated Vital Signs Ht '5\' 10"'$  (1.778 m)   Wt 77.1 kg   BMI 24.39 kg/m  Physical Exam Vitals and nursing note reviewed.  Constitutional:      General: He is not in acute distress.    Appearance: Normal appearance. He is well-developed. He is not ill-appearing or toxic-appearing.  HENT:     Head:   Eyes:     General:        Right eye: No discharge.        Left eye: No  discharge.     Extraocular Movements: Extraocular movements intact.     Conjunctiva/sclera: Conjunctivae normal.     Pupils: Pupils are equal, round, and reactive to light.  Neck:     Comments: No point/focal vertebral tenderness. Cardiovascular:     Rate and Rhythm: Normal rate and regular rhythm.     Pulses:          Radial pulses are 2+ on the right side and 2+ on the left side.  Pulmonary:     Effort: No respiratory distress.     Breath sounds: Normal breath sounds. No wheezing or rales.  Chest:     Chest wall: No tenderness.  Abdominal:     General: There is no distension.     Palpations: Abdomen is soft.     Tenderness: There is no abdominal tenderness.  Musculoskeletal:     Cervical back: Normal range of motion and neck supple.     Comments: Upper extremities: able to actively range throughout all major joints. Tender to the left glenohumeral joint, humerus, elbow, and proximal forearm. Otherwise nontender. Compartments are soft.  Back: No midline tenderness or step off.  Lower extremities: Able to lift legs off of the stretcher without difficulty, full ROM @ the knees & ankles. No tenderness to palpation.   Skin:    General: Skin is warm and dry.     Capillary Refill: Capillary refill takes less than 2 seconds.  Neurological:     Mental Status: He is alert.     Comments: Alert. Clear speech. Sensation grossly intact to bilateral upper and lower extremities. 5/5 symmetric grip strength and strength with plantar dorsiflexion bilaterally. Tremulous.   Psychiatric:        Mood and Affect: Mood normal.        Behavior: Behavior normal.     ED Results / Procedures / Treatments   Labs (all labs ordered are listed, but only abnormal results are displayed) Labs Reviewed  CBC WITH DIFFERENTIAL/PLATELET  BASIC METABOLIC PANEL  URINALYSIS, ROUTINE W REFLEX MICROSCOPIC    EKG None  Radiology No results found.  Procedures Procedures  {Document cardiac monitor, telemetry  assessment procedure when appropriate:1}  Medications Ordered in ED Medications  lidocaine-EPINEPHrine-tetracaine (LET) topical gel (has no administration in time range)    ED Course/ Medical Decision Making/ A&P                           Medical Decision Making Amount and/or Complexity of Data Reviewed Labs: ordered. Radiology: ordered.   ***  {Document critical care time when appropriate:1} {Document review of labs and clinical decision tools ie heart score, Chads2Vasc2 etc:1}  {Document your independent review of radiology images, and any outside records:1} {Document  your discussion with family members, caretakers, and with consultants:1} {Document social determinants of health affecting pt's care:1} {Document your decision making why or why not admission, treatments were needed:1} Final Clinical Impression(s) / ED Diagnoses Final diagnoses:  None    Rx / DC Orders ED Discharge Orders     None

## 2022-02-09 NOTE — ED Provider Notes (Signed)
[facility] EMERGENCY DEPARTMENT Provider Note   CSN: [id number] Arrival date & time: 02/09/22  2257     History {Add pertinent medical, surgical, social history, OB history to HPI:1} Chief Complaint  Patient presents with   Loss of Consciousness    John Herring is a 71 y.o. male with a hx of parkinson's disease, CKD, HTN, hyperlipidemia, orthostatic syncope, and prior VTE who presents to the ED via EMS S/p syncope tonight. Patient reports he was walking to the bathroom when his walker when he passed out, he tried to stand up and then passed out again. Reports head injury w/ mild headache. Also having LUE pain, reports chronic but feels worse since fall. Denies prodromal or current chest pain/dyspnea. Otherwise has been in his usual state of health today. Per EMS who discussed w/ family- patient w/ hx of recurrent syncope, usually occurs when he is seated, he takes midodrine for hypotension.   HPI     Home Medications Prior to Admission medications   Medication Sig Start Date End Date Taking? Authorizing Provider  aspirin EC 81 MG tablet Take 1 tablet (81 mg total) by mouth daily. 02/21/19   Jerline Pain, MD  carbidopa-levodopa (SINEMET IR) 25-100 MG tablet Take 1 tablet by mouth 6 (six) times daily. Taking 1 tablet every 2-3 hours daily    [provider]  Cholecalciferol (VITAMIN D3) 50 MCG (2000 UT) TABS Take 50 mcg by mouth daily.    [provider]  dextromethorphan (DELSYM) 30 MG/5ML liquid Take by mouth.    [provider]  diclofenac (VOLTAREN) 50 MG EC tablet  01/19/20   [provider]  ezetimibe (ZETIA) 10 MG tablet TAKE 1 TABLET BY MOUTH EVERY DAY 11/07/21   Biagio Borg, MD  finasteride (PROSCAR) 5 MG tablet = 1 Tab, ORAL, QEvening, 0 Refill(s), Maintenance 05/13/20   [provider]  fludrocortisone (FLORINEF) 0.1 MG tablet Take by mouth daily. 05/18/21   [provider]  lidocaine (LIDODERM) 5 %  Place 1 patch onto the skin daily. Remove & Discard patch within 12 hours or as directed by MD 09/27/21   Maudie Flakes, MD  midodrine (PROAMATINE) 5 MG tablet TAKE 1 TABLET BY MOUTH 3 TIMES DAILY WITH MEALS. 09/24/21   Biagio Borg, MD  mirabegron ER (MYRBETRIQ) 50 MG TB24 tablet Take 1 tablet (50 mg total) by mouth daily. 08/20/21   Biagio Borg, MD  oxyCODONE (ROXICODONE) 5 MG immediate release tablet Take 1 tablet (5 mg total) by mouth every 4 (four) hours as needed for severe pain. 09/27/21   Maudie Flakes, MD  oxyCODONE-acetaminophen (PERCOCET/ROXICET) 5-325 MG tablet Take 1 tablet by mouth every 6 (six) hours as needed for severe pain. 08/13/21   Horton, Barbette Hair, MD  rivastigmine (EXELON) 9.5 mg/24hr 1 Patch, TOP, DAILY, 0 Refill(s), Maintenance 12/04/20   [provider]  rosuvastatin (CRESTOR) 5 MG tablet TAKE 1 TABLET (5 MG TOTAL) BY MOUTH DAILY. 08/05/21   Jerline Pain, MD  triamcinolone cream (KENALOG) 0.1 % Apply 1 application. topically 2 (two) times daily. 07/02/21   [provider]      Allergies    Statins    Review of Systems   Review of Systems  Constitutional:  Negative for chills and fever.  Respiratory:  Negative for shortness of breath.   Cardiovascular:  Negative for chest pain.  Gastrointestinal:  Negative for abdominal pain and vomiting.  Musculoskeletal:  Positive for arthralgias.  Skin:  Positive for wound.  Neurological:  Positive for syncope and headaches.  All other systems reviewed and are negative.   Physical Exam Updated Vital Signs Ht '5\' 10"'$  (1.778 m)   Wt 77.1 kg   BMI 24.39 kg/m  Physical Exam Vitals and nursing note reviewed.  Constitutional:      General: He is not in acute distress.    Appearance: Normal appearance. He is well-developed. He is not ill-appearing or toxic-appearing.  HENT:     Head:   Eyes:     General:        Right eye: No discharge.        Left eye: No discharge.     Extraocular Movements: Extraocular  movements intact.     Conjunctiva/sclera: Conjunctivae normal.     Pupils: Pupils are equal, round, and reactive to light.  Neck:     Comments: No point/focal vertebral tenderness. Cardiovascular:     Rate and Rhythm: Normal rate and regular rhythm.     Pulses:          Radial pulses are 2+ on the right side and 2+ on the left side.  Pulmonary:     Effort: No respiratory distress.     Breath sounds: Normal breath sounds. No wheezing or rales.  Chest:     Chest wall: No tenderness.  Abdominal:     General: There is no distension.     Palpations: Abdomen is soft.     Tenderness: There is no abdominal tenderness.  Musculoskeletal:     Cervical back: Normal range of motion and neck supple.     Comments: Upper extremities: able to actively range throughout all major joints. Tender to the left glenohumeral joint, humerus, elbow, and proximal forearm. Otherwise nontender. Compartments are soft.  Back: No midline tenderness or step off.  Lower extremities: Able to lift legs off of the stretcher without difficulty, full ROM @ the knees & ankles. No tenderness to palpation.   Skin:    General: Skin is warm and dry.     Capillary Refill: Capillary refill takes less than 2 seconds.  Neurological:     Mental Status: He is alert.     Comments: Alert. Clear speech. Sensation grossly intact to bilateral upper and lower extremities. 5/5 symmetric grip strength and strength with plantar dorsiflexion bilaterally.  Psychiatric:        Mood and Affect: Mood normal.        Behavior: Behavior normal.     ED Results / Procedures / Treatments   Labs (all labs ordered are listed, but only abnormal results are displayed) Labs Reviewed  CBC WITH DIFFERENTIAL/PLATELET  BASIC METABOLIC PANEL  URINALYSIS, ROUTINE W REFLEX MICROSCOPIC    EKG None  Radiology No results found.  Procedures Procedures  {Document cardiac monitor, telemetry assessment procedure when appropriate:1}  Medications  Ordered in ED Medications  lidocaine-EPINEPHrine-tetracaine (LET) topical gel (has no administration in time range)    ED Course/ Medical Decision Making/ A&P                           Medical Decision Making Amount and/or Complexity of Data Reviewed Labs: ordered. Radiology: ordered.   ***  {Document critical care time when appropriate:1} {Document review of labs and clinical decision tools ie heart score, Chads2Vasc2 etc:1}  {Document your independent review of radiology images, and any outside records:1} {Document your discussion with family members, caretakers, and with consultants:1} {Document  social determinants of health affecting pt's care:1} {Document your decision making why or why not admission, treatments were needed:1} Final Clinical Impression(s) / ED Diagnoses Final diagnoses:  None    Rx / DC Orders ED Discharge Orders     None

## 2022-02-10 ENCOUNTER — Emergency Department (HOSPITAL_COMMUNITY): Payer: Medicare PPO

## 2022-02-10 DIAGNOSIS — R918 Other nonspecific abnormal finding of lung field: Secondary | ICD-10-CM | POA: Diagnosis not present

## 2022-02-10 DIAGNOSIS — I517 Cardiomegaly: Secondary | ICD-10-CM | POA: Diagnosis not present

## 2022-02-10 LAB — BASIC METABOLIC PANEL
Anion gap: 10 (ref 5–15)
BUN: 40 mg/dL — ABNORMAL HIGH (ref 8–23)
CO2: 20 mmol/L — ABNORMAL LOW (ref 22–32)
Calcium: 9 mg/dL (ref 8.9–10.3)
Chloride: 110 mmol/L (ref 98–111)
Creatinine, Ser: 2.53 mg/dL — ABNORMAL HIGH (ref 0.61–1.24)
GFR, Estimated: 27 mL/min — ABNORMAL LOW (ref >60)
Glucose, Bld: 106 mg/dL — ABNORMAL HIGH (ref 70–99)
Potassium: 4.1 mmol/L (ref 3.5–5.1)
Sodium: 140 mmol/L (ref 135–145)

## 2022-02-10 MED ORDER — SODIUM CHLORIDE 0.9 % IV BOLUS
1000.0000 mL | Freq: Once | INTRAVENOUS | Status: AC
  Administered 2022-02-10: 1000 mL via INTRAVENOUS

## 2022-02-10 NOTE — Discharge Instructions (Addendum)
You were seen in the emergency department today for passing out with a head injury.  Your imaging did not show any findings of a brain bleed or fracture.  Your blood work overall appeared similar to prior labs you have had done.  You were given some fluids for hydration.  Please continue to take your medications as prescribed by your primary care provider.  Please avoid quick transitions or quick movements.  Follow-up with primary care soon as possible.  Return to the emergency department for new or worsening symptoms or any other concerns.   Your laceration was closed with 6 stitches. Please keep this area clean and dry for the next 24 hours, after 24 hours you may get this area wet, but avoid soaking the area. Keep the area covered as best possible especially when in the sun to help in minimizing scarring.     You will need to have the stitches removed and the wound rechecked in 5 days. Please return to the emergency department, go to an urgent care, or see your primary care provider to have this performed. Return to the ER soon should you start to experience pus type drainage from the wound, redness around the wound, or fevers as this could indicate the area is infected, please return to the ER for any other worsening symptoms or concerns that you may have.

## 2022-02-10 NOTE — ED Notes (Signed)
Pt ambulated in hall with walker and denied dizziness during ambulation.

## 2022-02-10 NOTE — ED Notes (Signed)
Patient was sent back down from dialysis to be discharged however he was c/o chest pain states pain is sharp and worse with deep breathe, patient placed on monitor and ekg done.

## 2022-02-12 ENCOUNTER — Telehealth: Payer: Self-pay | Admitting: Internal Medicine

## 2022-02-12 NOTE — Telephone Encounter (Signed)
Cannot say much given this limited information -  Needs ROV and f/u with neurology likely as well

## 2022-02-12 NOTE — Telephone Encounter (Signed)
Lou Cal at [facility] called to report pt had a Syncope event  which pt was passed out for 30 seconds. Per Marcello Moores, spouse reports 4-5 times per day this occurs.  Marcello Moores wanted this event noted. Thomas direct phone [phone].

## 2022-02-12 NOTE — Telephone Encounter (Signed)
Please advise 

## 2022-02-14 ENCOUNTER — Ambulatory Visit: Payer: Medicare PPO | Admitting: Internal Medicine

## 2022-02-14 VITALS — BP 160/94 | HR 69 | Temp 97.9°F | Ht 70.0 in | Wt 160.0 lb

## 2022-02-14 DIAGNOSIS — N1832 Chronic kidney disease, stage 3b: Secondary | ICD-10-CM | POA: Diagnosis not present

## 2022-02-14 DIAGNOSIS — S0181XD Laceration without foreign body of other part of head, subsequent encounter: Secondary | ICD-10-CM

## 2022-02-14 DIAGNOSIS — D649 Anemia, unspecified: Secondary | ICD-10-CM | POA: Diagnosis not present

## 2022-02-14 NOTE — Progress Notes (Unsigned)
Patient ID: DAVIONE LENKER, male   DOB: Sep 01, 1950, 71 y.o.   MRN: [id number]        Chief Complaint: follow up right forehead laceration after fall with orthostasis, ckd3b, anemia       HPI:  John Herring is a 71 y.o. male here with wife after unfortunate fall with orthostasis, and laceration to right forehead, seen in ED with stitches place 5 days ago,.  Pt denies chest pain, increased sob or doe, wheezing, orthopnea, PND, increased LE swelling, palpitations, Pt denies polydipsia, polyuria, or new focal neuro s/s.    Pt denies fever, wt loss, night sweats, loss of appetite, or other constitutional symptoms  Denies change in urination.   Due for second shingles shot soon       Wt Readings from Last 3 Encounters:  02/14/22 160 lb (72.6 kg)  02/09/22 170 lb (77.1 kg)  11/28/21 166 lb (75.3 kg)   BP Readings from Last 3 Encounters:  02/14/22 (!) 160/94  02/10/22 (!) 158/86  01/20/22 (!) 120/90         Past Medical History:  Diagnosis Date   Arthritis    CKD (chronic kidney disease), stage III ([place])    Depression 05/06/2011   Enlarged prostate    Former consumption of alcohol    GERD (gastroesophageal reflux disease) 12/02/2012   patient denies this dx    History of kidney stones    passed stone   Hyperlipidemia 05/06/2011   Hypersomnia 05/06/2011   Hypotension    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   Memory loss 07/12/2012   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   Skin cancer 05/06/2011   Syncope    orthostatic syncope, no problems in tha last 3-4 months   Past Surgical History:  Procedure Laterality Date   BASAL CELL CARCINOMA EXCISION N/A 03/15/2018   Procedure: RE [place];  Surgeon: Irene Limbo, MD;  Location: [facility];  Service: Plastics;  Laterality: N/A;   CARDIAC CATHETERIZATION  02/28/2019   COLONOSCOPY  2013   deep brain stimulation     Parkinson's disease   LEFT HEART CATH AND CORONARY ANGIOGRAPHY N/A 02/28/2019   Procedure: LEFT  HEART CATH AND CORONARY ANGIOGRAPHY;  Surgeon: Nelva Bush, MD;  Location: [facility];  Service: Cardiovascular;  Laterality: N/A;   MASS EXCISION N/A 02/12/2018   Procedure: excision lesion back, layered closure 15 cm;  Surgeon: Irene Limbo, MD;  Location: [facility];  Service: Plastics;  Laterality: N/A;   SKIN SPLIT GRAFT Left 03/15/2018   Procedure: SKIN GRAFT FROM RIGHT OR LEFT THIGH;  Surgeon: Irene Limbo, MD;  Location: [facility];  Service: Plastics;  Laterality: Left;   SUBTHALAMIC STIMULATOR BATTERY REPLACEMENT N/A 09/01/2012   Procedure: SUBTHALAMIC STIMULATOR BATTERY REPLACEMENT;  Surgeon: Erline Levine, MD;  Location: [facility];  Service: Neurosurgery;  Laterality: N/A;  Deep Brain Stimulator battery change   SUBTHALAMIC STIMULATOR BATTERY REPLACEMENT N/A 07/19/2015   Procedure: Deep Brain stimulator battery replacement;  Surgeon: Erline Levine, MD;  Location: [facility];  Service: Neurosurgery;  Laterality: N/A;  Deep Brain stimulator battery replacement   SUBTHALAMIC STIMULATOR BATTERY REPLACEMENT N/A 01/20/2019   Procedure: Deep brain stimulator battery change;  Surgeon: Erline Levine, MD;  Location: [facility];  Service: Neurosurgery;  Laterality: N/A;  Deep brain stimulator battery change   TONSILLECTOMY     As a child    reports that he has never smoked. He has never used smokeless tobacco.  He reports that he does not drink alcohol and does not use drugs. family history is not on file. Allergies  Allergen Reactions   Statins     Makes parkinson worse, blood pressure drop   Current Outpatient Medications on File Prior to Visit  Medication Sig Dispense Refill   aspirin EC 81 MG tablet Take 1 tablet (81 mg total) by mouth daily. 90 tablet 3   carbidopa-levodopa (SINEMET IR) 25-100 MG tablet Take 1 tablet by mouth 6 (six) times daily. Taking 1 tablet every 2-3 hours daily     Cholecalciferol (VITAMIN D3) 50 MCG (2000 UT) TABS  Take 50 mcg by mouth daily.     dextromethorphan (DELSYM) 30 MG/5ML liquid Take by mouth.     diclofenac (VOLTAREN) 50 MG EC tablet      ezetimibe (ZETIA) 10 MG tablet TAKE 1 TABLET BY MOUTH EVERY DAY 90 tablet 2   finasteride (PROSCAR) 5 MG tablet = 1 Tab, ORAL, QEvening, 0 Refill(s), Maintenance     fludrocortisone (FLORINEF) 0.1 MG tablet Take by mouth daily.     lidocaine (LIDODERM) 5 % Place 1 patch onto the skin daily. Remove & Discard patch within 12 hours or as directed by MD 5 patch 0   midodrine (PROAMATINE) 5 MG tablet TAKE 1 TABLET BY MOUTH 3 TIMES DAILY WITH MEALS. 270 tablet 3   mirabegron ER (MYRBETRIQ) 50 MG TB24 tablet Take 1 tablet (50 mg total) by mouth daily. 90 tablet 3   rivastigmine (EXELON) 9.5 mg/24hr 1 Patch, TOP, DAILY, 0 Refill(s), Maintenance     rosuvastatin (CRESTOR) 5 MG tablet TAKE 1 TABLET (5 MG TOTAL) BY MOUTH DAILY. 90 tablet 3   triamcinolone cream (KENALOG) 0.1 % Apply 1 application. topically 2 (two) times daily.     cloNIDine (CATAPRES) 0.1 MG tablet Take 0.1 mg by mouth 2 (two) times daily.     No current facility-administered medications on file prior to visit.        ROS:  All others reviewed and negative.  Objective        PE:  BP (!) 160/94 (BP Location: Right Arm, Patient Position: Sitting, Cuff Size: Large)   Pulse 69   Temp 97.9 F (36.6 C) (Oral)   Ht '5\' 10"'$  (1.778 m)   Wt 160 lb (72.6 kg)   SpO2 98%   BMI 22.96 kg/m                 Constitutional: Pt appears in NAD               HENT: Head: NCAT.                Right Ear: External ear normal.                 Left Ear: External ear normal.                Eyes: . Pupils are equal, round, and reactive to light. Conjunctivae and EOM are normal               Nose: without d/c or deformity               Neck: Neck supple. Gross normal ROM               Cardiovascular: Normal rate and regular rhythm.                 Pulmonary/Chest: Effort normal and breath sounds without rales or  wheezing.                Abd:  Soft, NT, ND, + BS, no organomegaly               Neurological: Pt is alert. At baseline orientation, motor grossly intact               Skin: LE edema - none; right forehead lesion with intact healing laceration               Psychiatric: Pt behavior is normal without agitation   Micro: none  Cardiac tracings I have personally interpreted today:  none  Pertinent Radiological findings (summarize): none   Lab Results  Component Value Date   WBC 6.7 02/09/2022   HGB 10.0 (L) 02/09/2022   HCT 31.7 (L) 02/09/2022   PLT 235 02/09/2022   GLUCOSE 106 (H) 02/09/2022   CHOL 126 08/17/2020   TRIG 123.0 08/17/2020   HDL 45.20 08/17/2020   LDLDIRECT 76.0 06/06/2019   LDLCALC 56 08/17/2020   ALT 6 08/20/2021   AST 18 08/20/2021   NA 140 02/09/2022   K 4.1 02/09/2022   CL 110 02/09/2022   CREATININE 2.53 (H) 02/09/2022   BUN 40 (H) 02/09/2022   CO2 20 (L) 02/09/2022   TSH 2.68 08/17/2020   PSA 3.29 08/17/2020   HGBA1C 5.6 08/17/2020   Assessment/Plan:  CLIDE REMMERS is a 71 y.o. White or Caucasian [1] male with  has a past medical history of Arthritis, CKD (chronic kidney disease), stage III ([place]), Depression (05/06/2011), Enlarged prostate, Former consumption of alcohol, GERD (gastroesophageal reflux disease) (12/02/2012), History of kidney stones, Hyperlipidemia (05/06/2011), Hypersomnia (05/06/2011), Hypotension, 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05/06/2011), Memory loss (07/12/2012), Pseudobulbar affect (05/06/2011), Skin cancer (05/06/2011), and Syncope.  Forehead laceration, subsequent encounter Intact, but pt has appt already scheduled for next wed which would be 10 days post stitch placement, so will plan to remove at next visit  CKD (chronic kidney disease) stage 3, GFR 30-59 ml/min Lab Results  Component Value Date   CREATININE 2.53 (H) 02/09/2022   Stable overall, cont to avoid nephrotoxins   Anemia Lab Results  Component Value Date   WBC 6.7  02/09/2022   HGB 10.0 (L) 02/09/2022   HCT 31.7 (L) 02/09/2022   MCV 97.2 02/09/2022   PLT 235 02/09/2022  stable overall , no other blood loss, continue to monitor  Followup: Return if symptoms worsen or fail to improve.  Cathlean Cower, MD 02/15/2022 4:45 PM [facility]

## 2022-02-14 NOTE — Patient Instructions (Signed)
Please remember to have your second Shingles #2 shot at 2-3 6 months after the first  Please keep your appointment next Wednesday and the stitches can be removed then  Please continue all other medications as before, and refills have been done if requested.  Please have the pharmacy call with any other refills you may need.  Please keep your appointments with your specialists as you may have planned

## 2022-02-15 ENCOUNTER — Encounter: Payer: Self-pay | Admitting: Internal Medicine

## 2022-02-15 DIAGNOSIS — S0181XD Laceration without foreign body of other part of head, subsequent encounter: Secondary | ICD-10-CM | POA: Insufficient documentation

## 2022-02-15 NOTE — Assessment & Plan Note (Signed)
Intact, but pt has appt already scheduled for next wed which would be 10 days post stitch placement, so will plan to remove at next visit

## 2022-02-15 NOTE — Assessment & Plan Note (Signed)
Lab Results  Component Value Date   CREATININE 2.53 (H) 02/09/2022   Stable overall, cont to avoid nephrotoxins

## 2022-02-15 NOTE — Assessment & Plan Note (Signed)
Lab Results  Component Value Date   WBC 6.7 02/09/2022   HGB 10.0 (L) 02/09/2022   HCT 31.7 (L) 02/09/2022   MCV 97.2 02/09/2022   PLT 235 02/09/2022  stable overall , no other blood loss, continue to monitor

## 2022-02-18 ENCOUNTER — Telehealth: Payer: Self-pay | Admitting: *Deleted

## 2022-02-18 NOTE — Telephone Encounter (Signed)
        Patient  visited [facility] ed on 02/10/2022  for syncope   Telephone encounter attempt :  1st  A HIPAA compliant voice message was left requesting a return call.  Instructed patient to call back at [phone].  [facility] [phone] [address] , [place] [address] Email : [email or web address]

## 2022-02-18 NOTE — Telephone Encounter (Addendum)
Spoke with Gershon Mussel who states that patient had a syncope event and has them frequently throughout the day per patients spouse. Patient is on a medication that will help raise his bp and is also on a medication to help lower it. Patient was advised not to get up without his caregiver present or without assistance and when he does, he is out for 30 seconds or more. He does have a 13mof/u on 11/1, TGershon Musselis aware but he still wanted to inform you

## 2022-02-18 NOTE — Telephone Encounter (Signed)
Pt has appt nov 1, but if wife believes he may be dehydrated, he should go to ED now for IVFs

## 2022-02-19 ENCOUNTER — Encounter: Payer: Self-pay | Admitting: Internal Medicine

## 2022-02-19 ENCOUNTER — Ambulatory Visit: Payer: Medicare PPO | Admitting: Internal Medicine

## 2022-02-19 VITALS — BP 148/94 | HR 57 | Temp 98.2°F | Ht 70.0 in | Wt 160.0 lb

## 2022-02-19 DIAGNOSIS — R739 Hyperglycemia, unspecified: Secondary | ICD-10-CM | POA: Diagnosis not present

## 2022-02-19 DIAGNOSIS — J309 Allergic rhinitis, unspecified: Secondary | ICD-10-CM | POA: Diagnosis not present

## 2022-02-19 DIAGNOSIS — N1832 Chronic kidney disease, stage 3b: Secondary | ICD-10-CM

## 2022-02-19 DIAGNOSIS — S0181XD Laceration without foreign body of other part of head, subsequent encounter: Secondary | ICD-10-CM | POA: Diagnosis not present

## 2022-02-19 NOTE — Progress Notes (Signed)
SUBJECTIVE:Suture removal.  Patient returns for removal of stitches    OBJECTIVE:  6 stiches on the left upper forehead, 6 sutures were removed without difficulty.  ASSESSMENT: healed with no form of bleeding

## 2022-02-19 NOTE — Assessment & Plan Note (Signed)
Mild to mod, for claritin and nasacort asd,  to f/u any worsening symptoms or concerns

## 2022-02-19 NOTE — Assessment & Plan Note (Signed)
Lab Results  Component Value Date   HGBA1C 5.6 08/17/2020   Stable, pt to continue current medical treatment  - diet, wt control, excercise

## 2022-02-19 NOTE — Assessment & Plan Note (Signed)
Intact and no s/s infection, for stitches out today

## 2022-02-19 NOTE — Patient Instructions (Signed)
Ok to use OTC Claritin 10 mg per day, and/or Nasacort NS OTC for allergies  Your stitches were removed today  Please continue all other medications as before, and refills have been done if requested.  Please have the pharmacy call with any other refills you may need.  Please continue your efforts at being more active, low cholesterol diet, and weight control.  Please keep your appointments with your specialists as you may have planned  Please make an Appointment to return in 4 months, or sooner if needed

## 2022-02-19 NOTE — Progress Notes (Signed)
Patient ID: TAIJON VINK, male   DOB: 06/02/1950, 71 y.o.   MRN: [id number]        Chief Complaint: follow up forehead laceration for stitches out, allergies, hyperglycemia, ckd3b       HPI:  John Herring is a 71 y.o. male here with wife, overall doing well since last visit.  Does have fluctuating BP often low with low cognition and syncope several times per day, only takes the clonidine for SBP > 180, wife checks him four times per day  Does have several wks ongoing nasal allergy symptoms with clearish congestion, itch and sneezing, without fever, pain, ST, cough, swelling or wheezing.  Forehead laceration well intact,  due for stitches out today       Wt Readings from Last 3 Encounters:  02/19/22 160 lb (72.6 kg)  02/14/22 160 lb (72.6 kg)  02/09/22 170 lb (77.1 kg)   BP Readings from Last 3 Encounters:  02/19/22 (!) 148/94  02/14/22 (!) 160/94  02/10/22 (!) 158/86         Past Medical History:  Diagnosis Date   Arthritis    CKD (chronic kidney disease), stage III ([place])    Depression 05/06/2011   Enlarged prostate    Former consumption of alcohol    GERD (gastroesophageal reflux disease) 12/02/2012   patient denies this dx    History of kidney stones    passed stone   Hyperlipidemia 05/06/2011   Hypersomnia 05/06/2011   Hypotension    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   Memory loss 07/12/2012   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   Skin cancer 05/06/2011   Syncope    orthostatic syncope, no problems in tha last 3-4 months   Past Surgical History:  Procedure Laterality Date   BASAL CELL CARCINOMA EXCISION N/A 03/15/2018   Procedure: RE [place];  Surgeon: Irene Limbo, MD;  Location: [place];  Service: Plastics;  Laterality: N/A;   CARDIAC CATHETERIZATION  02/28/2019   COLONOSCOPY  2013   deep brain stimulation     Parkinson's disease   LEFT HEART CATH AND CORONARY ANGIOGRAPHY N/A 02/28/2019   Procedure: LEFT HEART CATH AND CORONARY  ANGIOGRAPHY;  Surgeon: Nelva Bush, MD;  Location: [place] [facility];  Service: Cardiovascular;  Laterality: N/A;   MASS EXCISION N/A 02/12/2018   Procedure: excision lesion back, layered closure 15 cm;  Surgeon: Irene Limbo, MD;  Location: [place];  Service: Plastics;  Laterality: N/A;   SKIN SPLIT GRAFT Left 03/15/2018   Procedure: SKIN GRAFT FROM RIGHT OR LEFT THIGH;  Surgeon: Irene Limbo, MD;  Location: [place];  Service: Plastics;  Laterality: Left;   SUBTHALAMIC STIMULATOR BATTERY REPLACEMENT N/A 09/01/2012   Procedure: SUBTHALAMIC STIMULATOR BATTERY REPLACEMENT;  Surgeon: Erline Levine, MD;  Location: [place] [facility];  Service: Neurosurgery;  Laterality: N/A;  Deep Brain Stimulator battery change   SUBTHALAMIC STIMULATOR BATTERY REPLACEMENT N/A 07/19/2015   Procedure: Deep Brain stimulator battery replacement;  Surgeon: Erline Levine, MD;  Location: [place] [facility];  Service: Neurosurgery;  Laterality: N/A;  Deep Brain stimulator battery replacement   SUBTHALAMIC STIMULATOR BATTERY REPLACEMENT N/A 01/20/2019   Procedure: Deep brain stimulator battery change;  Surgeon: Erline Levine, MD;  Location: [place];  Service: Neurosurgery;  Laterality: N/A;  Deep brain stimulator battery change   TONSILLECTOMY     As a child    reports that he has never smoked. He has never used smokeless tobacco. He reports that he  does not drink alcohol and does not use drugs. family history is not on file. Allergies  Allergen Reactions   Statins     Makes parkinson worse, blood pressure drop   Current Outpatient Medications on File Prior to Visit  Medication Sig Dispense Refill   aspirin EC 81 MG tablet Take 1 tablet (81 mg total) by mouth daily. 90 tablet 3   carbidopa-levodopa (SINEMET IR) 25-100 MG tablet Take 1 tablet by mouth 6 (six) times daily. Taking 1 tablet every 2-3 hours daily     Cholecalciferol (VITAMIN D3) 50 MCG (2000 UT) TABS Take 50 mcg by mouth  daily.     cloNIDine (CATAPRES) 0.1 MG tablet Take 0.1 mg by mouth 2 (two) times daily.     dextromethorphan (DELSYM) 30 MG/5ML liquid Take by mouth.     diclofenac (VOLTAREN) 50 MG EC tablet      ezetimibe (ZETIA) 10 MG tablet TAKE 1 TABLET BY MOUTH EVERY DAY 90 tablet 2   finasteride (PROSCAR) 5 MG tablet = 1 Tab, ORAL, QEvening, 0 Refill(s), Maintenance     fludrocortisone (FLORINEF) 0.1 MG tablet Take by mouth daily.     lidocaine (LIDODERM) 5 % Place 1 patch onto the skin daily. Remove & Discard patch within 12 hours or as directed by MD 5 patch 0   midodrine (PROAMATINE) 5 MG tablet TAKE 1 TABLET BY MOUTH 3 TIMES DAILY WITH MEALS. 270 tablet 3   mirabegron ER (MYRBETRIQ) 50 MG TB24 tablet Take 1 tablet (50 mg total) by mouth daily. 90 tablet 3   rivastigmine (EXELON) 9.5 mg/24hr 1 Patch, TOP, DAILY, 0 Refill(s), Maintenance     rosuvastatin (CRESTOR) 5 MG tablet TAKE 1 TABLET (5 MG TOTAL) BY MOUTH DAILY. 90 tablet 3   triamcinolone cream (KENALOG) 0.1 % Apply 1 application. topically 2 (two) times daily.     COMIRNATY SUSP injection Inject into the muscle.     No current facility-administered medications on file prior to visit.        ROS:  All others reviewed and negative.  Objective        PE:  BP (!) 148/94   Pulse (!) 57   Temp 98.2 F (36.8 C) (Oral)   Ht '5\' 10"'$  (1.778 m)   Wt 160 lb (72.6 kg)   SpO2 95%   BMI 22.96 kg/m                 Constitutional: Pt appears in NAD               HENT: Head: NCAT.                Right Ear: External ear normal.                 Left Ear: External ear normal.                Eyes: . Pupils are equal, round, and reactive to light. Conjunctivae and EOM are normal               Nose: without d/c or deformity               Neck: Neck supple. Gross normal ROM               Cardiovascular: Normal rate and regular rhythm.                 Pulmonary/Chest: Effort normal and breath sounds without rales or wheezing.  Abd:  Soft,  NT, ND, + BS, no organomegaly               Neurological: Pt is alert. At baseline orientation, motor grossly intact               Skin: Skin is warm. No rashes, no other new lesions, LE edema - none               Psychiatric: Pt behavior is normal without agitation   Micro: none  Cardiac tracings I have personally interpreted today:  none  Pertinent Radiological findings (summarize): none   Lab Results  Component Value Date   WBC 6.7 02/09/2022   HGB 10.0 (L) 02/09/2022   HCT 31.7 (L) 02/09/2022   PLT 235 02/09/2022   GLUCOSE 106 (H) 02/09/2022   CHOL 126 08/17/2020   TRIG 123.0 08/17/2020   HDL 45.20 08/17/2020   LDLDIRECT 76.0 06/06/2019   LDLCALC 56 08/17/2020   ALT 6 08/20/2021   AST 18 08/20/2021   NA 140 02/09/2022   K 4.1 02/09/2022   CL 110 02/09/2022   CREATININE 2.53 (H) 02/09/2022   BUN 40 (H) 02/09/2022   CO2 20 (L) 02/09/2022   TSH 2.68 08/17/2020   PSA 3.29 08/17/2020   HGBA1C 5.6 08/17/2020   Assessment/Plan:  EDER MACEK is a 71 y.o. White or Caucasian [1] male with  has a past medical history of Arthritis, CKD (chronic kidney disease), stage III ([place]), Depression (05/06/2011), Enlarged prostate, Former consumption of alcohol, GERD (gastroesophageal reflux disease) (12/02/2012), History of kidney stones, Hyperlipidemia (05/06/2011), Hypersomnia (05/06/2011), Hypotension, 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05/06/2011), Memory loss (07/12/2012), Pseudobulbar affect (05/06/2011), Skin cancer (05/06/2011), and Syncope.  Hyperglycemia Lab Results  Component Value Date   HGBA1C 5.6 08/17/2020   Stable, pt to continue current medical treatment  - diet, wt control, excercise   Forehead laceration, subsequent encounter Intact and no s/s infection, for stitches out today  CKD (chronic kidney disease) stage 3, GFR 30-59 ml/min Lab Results  Component Value Date   CREATININE 2.53 (H) 02/09/2022   Stable overall, cont to avoid nephrotoxins   Allergic  rhinitis Mild to mod, for claritin and nasacort asd,  to f/u any worsening symptoms or concerns  Followup: Return in about 4 months (around 06/20/2022).  Cathlean Cower, MD 02/19/2022 7:38 PM [facility]

## 2022-02-19 NOTE — Assessment & Plan Note (Signed)
Lab Results  Component Value Date   CREATININE 2.53 (H) 02/09/2022   Stable overall, cont to avoid nephrotoxins

## 2022-02-20 ENCOUNTER — Telehealth: Payer: Self-pay | Admitting: *Deleted

## 2022-02-20 NOTE — Telephone Encounter (Signed)
     Patient  visit on 02/10/2022  at [facility] was for treatment   Have you been able to follow up with your primary care physician? Has had stitches removed and is doing much better  The patient was able to obtain any needed medicine or equipment.  Are there diet recommendations that you are having difficulty following?  Patient expresses understanding of discharge instructions and education provided has no other needs at this time.    [facility] [phone] [address] , [place] [address] Email : [email or web address]

## 2022-03-05 DIAGNOSIS — G249 Dystonia, unspecified: Secondary | ICD-10-CM | POA: Diagnosis not present

## 2022-03-05 DIAGNOSIS — K117 Disturbances of salivary secretion: Secondary | ICD-10-CM | POA: Diagnosis not present

## 2022-03-05 DIAGNOSIS — G20A1 Parkinson's disease without dyskinesia, without mention of fluctuations: Secondary | ICD-10-CM | POA: Diagnosis not present

## 2022-03-05 DIAGNOSIS — G4752 REM sleep behavior disorder: Secondary | ICD-10-CM | POA: Diagnosis not present

## 2022-03-17 DIAGNOSIS — H2513 Age-related nuclear cataract, bilateral: Secondary | ICD-10-CM | POA: Diagnosis not present

## 2022-03-17 DIAGNOSIS — H35372 Puckering of macula, left eye: Secondary | ICD-10-CM | POA: Diagnosis not present

## 2022-03-17 DIAGNOSIS — H25013 Cortical age-related cataract, bilateral: Secondary | ICD-10-CM | POA: Diagnosis not present

## 2022-03-17 DIAGNOSIS — H52223 Regular astigmatism, bilateral: Secondary | ICD-10-CM | POA: Diagnosis not present

## 2022-03-17 DIAGNOSIS — H524 Presbyopia: Secondary | ICD-10-CM | POA: Diagnosis not present

## 2022-03-17 DIAGNOSIS — H25042 Posterior subcapsular polar age-related cataract, left eye: Secondary | ICD-10-CM | POA: Diagnosis not present

## 2022-03-17 DIAGNOSIS — H5203 Hypermetropia, bilateral: Secondary | ICD-10-CM | POA: Diagnosis not present

## 2022-03-20 DIAGNOSIS — Z9682 Presence of neurostimulator: Secondary | ICD-10-CM | POA: Diagnosis not present

## 2022-03-20 DIAGNOSIS — G20A1 Parkinson's disease without dyskinesia, without mention of fluctuations: Secondary | ICD-10-CM | POA: Diagnosis not present

## 2022-04-02 DIAGNOSIS — Z85828 Personal history of other malignant neoplasm of skin: Secondary | ICD-10-CM | POA: Diagnosis not present

## 2022-04-02 DIAGNOSIS — C44319 Basal cell carcinoma of skin of other parts of face: Secondary | ICD-10-CM | POA: Diagnosis not present

## 2022-04-09 DIAGNOSIS — L821 Other seborrheic keratosis: Secondary | ICD-10-CM | POA: Diagnosis not present

## 2022-04-09 DIAGNOSIS — Z85828 Personal history of other malignant neoplasm of skin: Secondary | ICD-10-CM | POA: Diagnosis not present

## 2022-04-09 DIAGNOSIS — L570 Actinic keratosis: Secondary | ICD-10-CM | POA: Diagnosis not present

## 2022-04-09 DIAGNOSIS — L812 Freckles: Secondary | ICD-10-CM | POA: Diagnosis not present

## 2022-04-09 DIAGNOSIS — L720 Epidermal cyst: Secondary | ICD-10-CM | POA: Diagnosis not present

## 2022-04-24 DIAGNOSIS — M47812 Spondylosis without myelopathy or radiculopathy, cervical region: Secondary | ICD-10-CM | POA: Diagnosis not present

## 2022-04-24 DIAGNOSIS — M542 Cervicalgia: Secondary | ICD-10-CM | POA: Diagnosis not present

## 2022-04-29 DIAGNOSIS — G20A1 Parkinson's disease without dyskinesia, without mention of fluctuations: Secondary | ICD-10-CM | POA: Diagnosis not present

## 2022-04-29 DIAGNOSIS — L989 Disorder of the skin and subcutaneous tissue, unspecified: Secondary | ICD-10-CM | POA: Diagnosis not present

## 2022-04-29 DIAGNOSIS — N183 Chronic kidney disease, stage 3 unspecified: Secondary | ICD-10-CM | POA: Diagnosis not present

## 2022-04-29 DIAGNOSIS — Z87891 Personal history of nicotine dependence: Secondary | ICD-10-CM | POA: Diagnosis not present

## 2022-05-09 DIAGNOSIS — G20A1 Parkinson's disease without dyskinesia, without mention of fluctuations: Secondary | ICD-10-CM | POA: Diagnosis not present

## 2022-05-09 DIAGNOSIS — C4441 Basal cell carcinoma of skin of scalp and neck: Secondary | ICD-10-CM | POA: Diagnosis not present

## 2022-05-09 DIAGNOSIS — Z7982 Long term (current) use of aspirin: Secondary | ICD-10-CM | POA: Diagnosis not present

## 2022-05-09 DIAGNOSIS — Z79899 Other long term (current) drug therapy: Secondary | ICD-10-CM | POA: Diagnosis not present

## 2022-05-09 DIAGNOSIS — Z4542 Encounter for adjustment and management of neuropacemaker (brain) (peripheral nerve) (spinal cord): Secondary | ICD-10-CM | POA: Diagnosis not present

## 2022-05-09 DIAGNOSIS — I951 Orthostatic hypotension: Secondary | ICD-10-CM | POA: Diagnosis not present

## 2022-05-09 DIAGNOSIS — Z87891 Personal history of nicotine dependence: Secondary | ICD-10-CM | POA: Diagnosis not present

## 2022-05-09 DIAGNOSIS — N183 Chronic kidney disease, stage 3 unspecified: Secondary | ICD-10-CM | POA: Diagnosis not present

## 2022-05-12 DIAGNOSIS — R972 Elevated prostate specific antigen [PSA]: Secondary | ICD-10-CM | POA: Diagnosis not present

## 2022-05-12 DIAGNOSIS — R350 Frequency of micturition: Secondary | ICD-10-CM | POA: Diagnosis not present

## 2022-05-12 DIAGNOSIS — N401 Enlarged prostate with lower urinary tract symptoms: Secondary | ICD-10-CM | POA: Diagnosis not present

## 2022-05-19 DIAGNOSIS — N184 Chronic kidney disease, stage 4 (severe): Secondary | ICD-10-CM | POA: Diagnosis not present

## 2022-05-20 DIAGNOSIS — N184 Chronic kidney disease, stage 4 (severe): Secondary | ICD-10-CM | POA: Diagnosis not present

## 2022-05-22 DIAGNOSIS — Z79899 Other long term (current) drug therapy: Secondary | ICD-10-CM | POA: Diagnosis not present

## 2022-05-22 DIAGNOSIS — G20A1 Parkinson's disease without dyskinesia, without mention of fluctuations: Secondary | ICD-10-CM | POA: Diagnosis not present

## 2022-05-28 ENCOUNTER — Ambulatory Visit: Payer: Medicare PPO | Admitting: Internal Medicine

## 2022-05-29 DIAGNOSIS — G20A1 Parkinson's disease without dyskinesia, without mention of fluctuations: Secondary | ICD-10-CM | POA: Diagnosis not present

## 2022-05-29 DIAGNOSIS — R399 Unspecified symptoms and signs involving the genitourinary system: Secondary | ICD-10-CM | POA: Diagnosis not present

## 2022-05-29 DIAGNOSIS — E872 Acidosis, unspecified: Secondary | ICD-10-CM | POA: Diagnosis not present

## 2022-05-29 DIAGNOSIS — G903 Multi-system degeneration of the autonomic nervous system: Secondary | ICD-10-CM | POA: Diagnosis not present

## 2022-05-29 DIAGNOSIS — N184 Chronic kidney disease, stage 4 (severe): Secondary | ICD-10-CM | POA: Diagnosis not present

## 2022-05-30 ENCOUNTER — Encounter: Payer: Self-pay | Admitting: Internal Medicine

## 2022-05-30 ENCOUNTER — Ambulatory Visit: Payer: Medicare PPO | Admitting: Internal Medicine

## 2022-05-30 VITALS — BP 120/68 | HR 64 | Temp 97.6°F | Ht 70.0 in | Wt 160.0 lb

## 2022-05-30 DIAGNOSIS — Z0000 Encounter for general adult medical examination without abnormal findings: Secondary | ICD-10-CM | POA: Diagnosis not present

## 2022-05-30 DIAGNOSIS — E559 Vitamin D deficiency, unspecified: Secondary | ICD-10-CM | POA: Diagnosis not present

## 2022-05-30 DIAGNOSIS — R739 Hyperglycemia, unspecified: Secondary | ICD-10-CM | POA: Diagnosis not present

## 2022-05-30 DIAGNOSIS — E7800 Pure hypercholesterolemia, unspecified: Secondary | ICD-10-CM | POA: Diagnosis not present

## 2022-05-30 DIAGNOSIS — N1832 Chronic kidney disease, stage 3b: Secondary | ICD-10-CM

## 2022-05-30 NOTE — Patient Instructions (Signed)
Please continue all other medications as before, and refills have been done if requested. ° °Please have the pharmacy call with any other refills you may need. ° °Please continue your efforts at being more active, low cholesterol diet, and weight control. ° °You are otherwise up to date with prevention measures today. ° °Please keep your appointments with your specialists as you may have planned ° °Please make an Appointment to return in 6 months, or sooner if needed °

## 2022-05-30 NOTE — Progress Notes (Unsigned)
Patient ID: John Herring, male   DOB: February 10, 1951, 72 y.o.   MRN: [id number]

## 2022-05-30 NOTE — Progress Notes (Unsigned)
Patient ID: John Herring, male   DOB: May 13, 1950, 72 y.o.   MRN: [id number]         Chief Complaint:: wellness exam and ckd, hyperglycemia, hld, low vit d       HPI:  John Herring is a 72 y.o. male here for wellness exam; decliens covid booster, colonoscopy o/w up to date                        Also s/p deep brain stim placement jan 19 at [facility].  Did have fall x 2 in oct 2023 with staples, no recent injury otherwise.  Saw nephrology with stable renal fxn, to f/u with PA.  Saw orthopedic but could not tolerate tramadol.  Likely has left rot cuff tear but surgury not felt to be appropriate.  CBD cream helps. Saw urology aprpox 1 mo ago, stable, f/u at 6 mo on mybetrix and finasteride.  To start botox soon for drooling.  .Pt denies chest pain, increased sob or doe, wheezing, orthopnea, PND, increased LE swelling, palpitations, dizziness or syncope.   Pt denies polydipsia, polyuria, or new focal neuro s/s.    Pt denies fever, wt loss, night sweats, loss of appetite, or other constitutional symptoms     Wt Readings from Last 3 Encounters:  05/30/22 160 lb (72.6 kg)  02/19/22 160 lb (72.6 kg)  02/14/22 160 lb (72.6 kg)   BP Readings from Last 3 Encounters:  05/30/22 120/68  02/19/22 (!) 148/94  02/14/22 (!) 160/94   Immunization History  Administered Date(s) Administered   Fluad Quad(high Dose 65+) 01/05/2019, 02/07/2020   Influenza, High Dose Seasonal PF 01/29/2021   Influenza,inj,Quad PF,6+ Mos 02/09/2015   Influenza-Unspecified 12/20/2012, 01/31/2017, 01/02/2018, 02/05/2021, 02/05/2022   PFIZER(Purple Top)SARS-COV-2 Vaccination 05/10/2019, 05/31/2019, 01/17/2020, 09/06/2020, 02/05/2021   PPD Test 12/29/2020   Pfizer Covid-19 Vaccine Bivalent Booster 33yr & up 02/12/2022   Pneumococcal Conjugate-13 08/08/2019, 12/28/2020   Pneumococcal Polysaccharide-23 08/17/2020   Pneumococcal-Unspecified 12/31/2021, 03/28/2022   Respiratory Syncytial Virus Vaccine,Recomb Aduvanted(Arexvy) 03/03/2022    Td 06/09/2008   Tdap 08/08/2019   Zoster Recombinat (Shingrix) 12/31/2021, 03/11/2022   Zoster, Live 05/03/2001   Health Maintenance Due  Topic Date Due   COVID-19 Vaccine (7 - 2023-24 season) 04/09/2022      Past Medical History:  Diagnosis Date   Arthritis    CKD (chronic kidney disease), stage III ([place]    Depression 05/06/2011   Enlarged prostate    Former consumption of alcohol    GERD (gastroesophageal reflux disease) 12/02/2012   patient denies this dx    History of kidney stones    passed stone   Hyperlipidemia 05/06/2011   Hypersomnia 05/06/2011   Hypotension    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   Memory loss 07/12/2012   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   Skin cancer 05/06/2011   Syncope    orthostatic syncope, no problems in tha last 3-4 months   Past Surgical History:  Procedure Laterality Date   BASAL CELL CARCINOMA EXCISION N/A 03/15/2018   Procedure: RE [place]  Surgeon: TIrene Limbo MD;  Location: [place]  Service: Plastics;  Laterality: N/A;   CARDIAC CATHETERIZATION  02/28/2019   COLONOSCOPY  2013   deep brain stimulation     Parkinson's disease   LEFT HEART CATH AND CORONARY ANGIOGRAPHY N/A 02/28/2019   Procedure: LEFT HEART CATH AND CORONARY ANGIOGRAPHY;  Surgeon: ENelva Bush MD;  [facility];  Service:  Cardiovascular;  Laterality: N/A;   MASS EXCISION N/A 02/12/2018   Procedure: excision lesion back, layered closure 15 cm;  Surgeon: Irene Limbo, MD;  Location: [place];  Service: Plastics;  Laterality: N/A;   SKIN SPLIT GRAFT Left 03/15/2018   Procedure: SKIN GRAFT FROM RIGHT OR LEFT THIGH;  Surgeon: Irene Limbo, MD;  Location: [place];  Service: Plastics;  Laterality: Left;   SUBTHALAMIC STIMULATOR BATTERY REPLACEMENT N/A 09/01/2012   Procedure: SUBTHALAMIC STIMULATOR BATTERY REPLACEMENT;  Surgeon: Erline Levine, MD;  Location: [place] [facility];   Service: Neurosurgery;  Laterality: N/A;  Deep Brain Stimulator battery change   SUBTHALAMIC STIMULATOR BATTERY REPLACEMENT N/A 07/19/2015   Procedure: Deep Brain stimulator battery replacement;  Surgeon: Erline Levine, MD;  Location: [place] [facility];  Service: Neurosurgery;  Laterality: N/A;  Deep Brain stimulator battery replacement   SUBTHALAMIC STIMULATOR BATTERY REPLACEMENT N/A 01/20/2019   Procedure: Deep brain stimulator battery change;  Surgeon: Erline Levine, MD;  Location: [place];  Service: Neurosurgery;  Laterality: N/A;  Deep brain stimulator battery change   TONSILLECTOMY     As a child    reports that he has never smoked. He has never used smokeless tobacco. He reports that he does not drink alcohol and does not use drugs. family history is not on file. Allergies  Allergen Reactions   Statins     Makes parkinson worse, blood pressure drop   Tramadol Other (See Comments)    Confusion upset   Current Outpatient Medications on File Prior to Visit  Medication Sig Dispense Refill   aspirin EC 81 MG tablet Take 1 tablet (81 mg total) by mouth daily. 90 tablet 3   carbidopa-levodopa (SINEMET IR) 25-100 MG tablet Take 1 tablet by mouth 6 (six) times daily. Taking 1 tablet every 2-3 hours daily     Cholecalciferol (VITAMIN D3) 50 MCG (2000 UT) TABS Take 50 mcg by mouth daily.     clonazePAM (KLONOPIN) 0.5 MG tablet Take 1/2 tab at bedtime x 1 week, then increase to 1 tab at bedtime     cloNIDine (CATAPRES) 0.1 MG tablet Take 0.1 mg by mouth 2 (two) times daily.     COMIRNATY SUSP injection Inject into the muscle.     dextromethorphan (DELSYM) 30 MG/5ML liquid Take by mouth.     ezetimibe (ZETIA) 10 MG tablet TAKE 1 TABLET BY MOUTH EVERY DAY 90 tablet 2   finasteride (PROSCAR) 5 MG tablet = 1 Tab, ORAL, QEvening, 0 Refill(s), Maintenance     fludrocortisone (FLORINEF) 0.1 MG tablet Take by mouth daily.     lidocaine (LIDODERM) 5 % Place 1 patch onto the skin daily. Remove & Discard patch  within 12 hours or as directed by MD 5 patch 0   midodrine (PROAMATINE) 5 MG tablet TAKE 1 TABLET BY MOUTH 3 TIMES DAILY WITH MEALS. 270 tablet 3   mirabegron ER (MYRBETRIQ) 50 MG TB24 tablet Take 1 tablet (50 mg total) by mouth daily. 90 tablet 3   rivastigmine (EXELON) 9.5 mg/24hr 1 Patch, TOP, DAILY, 0 Refill(s), Maintenance     rosuvastatin (CRESTOR) 5 MG tablet TAKE 1 TABLET (5 MG TOTAL) BY MOUTH DAILY. 90 tablet 3   triamcinolone cream (KENALOG) 0.1 % Apply 1 application. topically 2 (two) times daily.     No current facility-administered medications on file prior to visit.        ROS:  All others reviewed and negative.  Objective  PE:  BP 120/68 (BP Location: Right Arm, Patient Position: Sitting, Cuff Size: Normal)   Pulse 64   Temp 97.6 F (36.4 C) (Oral)   Ht 5' 10"$  (1.778 m)   Wt 160 lb (72.6 kg)   SpO2 99%   BMI 22.96 kg/m                 Constitutional: Pt appears in NAD               HENT: Head: NCAT.                Right Ear: External ear normal.                 Left Ear: External ear normal.                Eyes: . Pupils are equal, round, and reactive to light. Conjunctivae and EOM are normal               Nose: without d/c or deformity               Neck: Neck supple. Gross normal ROM               Cardiovascular: Normal rate and regular rhythm.                 Pulmonary/Chest: Effort normal and breath sounds without rales or wheezing.                Abd:  Soft, NT, ND, + BS, no organomegaly               Neurological: Pt is alert. At baseline orientation, motor grossly intact               Skin: Skin is warm. No rashes, no other new lesions, LE edema - none               Psychiatric: Pt behavior is normal without agitation   Micro: none  Cardiac tracings I have personally interpreted today:  none  Pertinent Radiological findings (summarize): none   Lab Results  Component Value Date   WBC 6.7 02/09/2022   HGB 10.0 (L) 02/09/2022   HCT 31.7 (L)  02/09/2022   PLT 235 02/09/2022   GLUCOSE 106 (H) 02/09/2022   CHOL 126 08/17/2020   TRIG 123.0 08/17/2020   HDL 45.20 08/17/2020   LDLDIRECT 76.0 06/06/2019   LDLCALC 56 08/17/2020   ALT 6 08/20/2021   AST 18 08/20/2021   NA 140 02/09/2022   K 4.1 02/09/2022   CL 110 02/09/2022   CREATININE 2.53 (H) 02/09/2022   BUN 40 (H) 02/09/2022   CO2 20 (L) 02/09/2022   TSH 2.68 08/17/2020   PSA 3.29 08/17/2020   HGBA1C 5.6 08/17/2020   Assessment/Plan:  John Herring is a 72 y.o. White or Caucasian [1] male with  has a past medical history of Arthritis, CKD (chronic kidney disease), stage III ([place]), Depression (05/06/2011), Enlarged prostate, Former consumption of alcohol, GERD (gastroesophageal reflux disease) (12/02/2012), History of kidney stones, Hyperlipidemia (05/06/2011), Hypersomnia (05/06/2011), Hypotension, 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05/06/2011), Memory loss (07/12/2012), Pseudobulbar affect (05/06/2011), Skin cancer (05/06/2011), and Syncope.  Preventative health care Age and sex appropriate education and counseling updated with regular exercise and diet Referrals for preventative services - declines colonoscopy Immunizations addressed - declines covid booster Smoking counseling  - none needed Evidence for depression or other mood disorder - none significant Most recent labs reviewed. I  have personally reviewed and have noted: 1) the patient's medical and social history 2) The patient's current medications and supplements 3) The patient's height, weight, and BMI have been recorded in the chart   CKD (chronic kidney disease) stage 3, GFR 30-59 ml/min Lab Results  Component Value Date   CREATININE 2.53 (H) 02/09/2022   Stable overall, cont to avoid nephrotoxins, f/u renal as planned, declines f/u lab today  Hyperglycemia Lab Results  Component Value Date   HGBA1C 5.6 08/17/2020   Stable, pt to continue current medical treatment  - diet, wt  control   Hyperlipidemia Lab Results  Component Value Date   LDLCALC 56 08/17/2020   Stable, pt to continue current statin zetia 10 mg, crestor 5 mg qd, decline f/u lab today since has had several lab draws recently such as with renal   Vitamin D deficiency Last vitamin D Lab Results  Component Value Date   VD25OH 41.74 08/17/2020   Stable, cont oral replacement  Followup: Return in about 6 months (around 11/28/2022).  Cathlean Cower, MD 06/01/2022 1:05 PM [facility]

## 2022-06-01 ENCOUNTER — Encounter: Payer: Self-pay | Admitting: Internal Medicine

## 2022-06-01 NOTE — Assessment & Plan Note (Signed)
Age and sex appropriate education and counseling updated with regular exercise and diet Referrals for preventative services - declines colonoscopy Immunizations addressed - declines covid booster Smoking counseling  - none needed Evidence for depression or other mood disorder - none significant Most recent labs reviewed. I have personally reviewed and have noted: 1) the patient's medical and social history 2) The patient's current medications and supplements 3) The patient's height, weight, and BMI have been recorded in the chart

## 2022-06-01 NOTE — Assessment & Plan Note (Signed)
Lab Results  Component Value Date   HGBA1C 5.6 08/17/2020   Stable, pt to continue current medical treatment  - diet, wt control

## 2022-06-01 NOTE — Assessment & Plan Note (Signed)
Lab Results  Component Value Date   LDLCALC 56 08/17/2020   Stable, pt to continue current statin zetia 10 mg, crestor 5 mg qd, decline f/u lab today since has had several lab draws recently such as with renal

## 2022-06-01 NOTE — Assessment & Plan Note (Signed)
Last vitamin D Lab Results  Component Value Date   VD25OH 41.74 08/17/2020   Stable, cont oral replacement

## 2022-06-01 NOTE — Assessment & Plan Note (Signed)
Lab Results  Component Value Date   CREATININE 2.53 (H) 02/09/2022   Stable overall, cont to avoid nephrotoxins, f/u renal as planned, declines f/u lab today

## 2022-06-04 IMAGING — DX DG RIBS W/ CHEST 3+V*L*
3 series · 3 of 3 positions shown · non-contrast
Comparison: 01/13/2019

CLINICAL DATA: Fall

EXAM:
LEFT RIBS AND CHEST - 3+ VIEW

[rib obl]
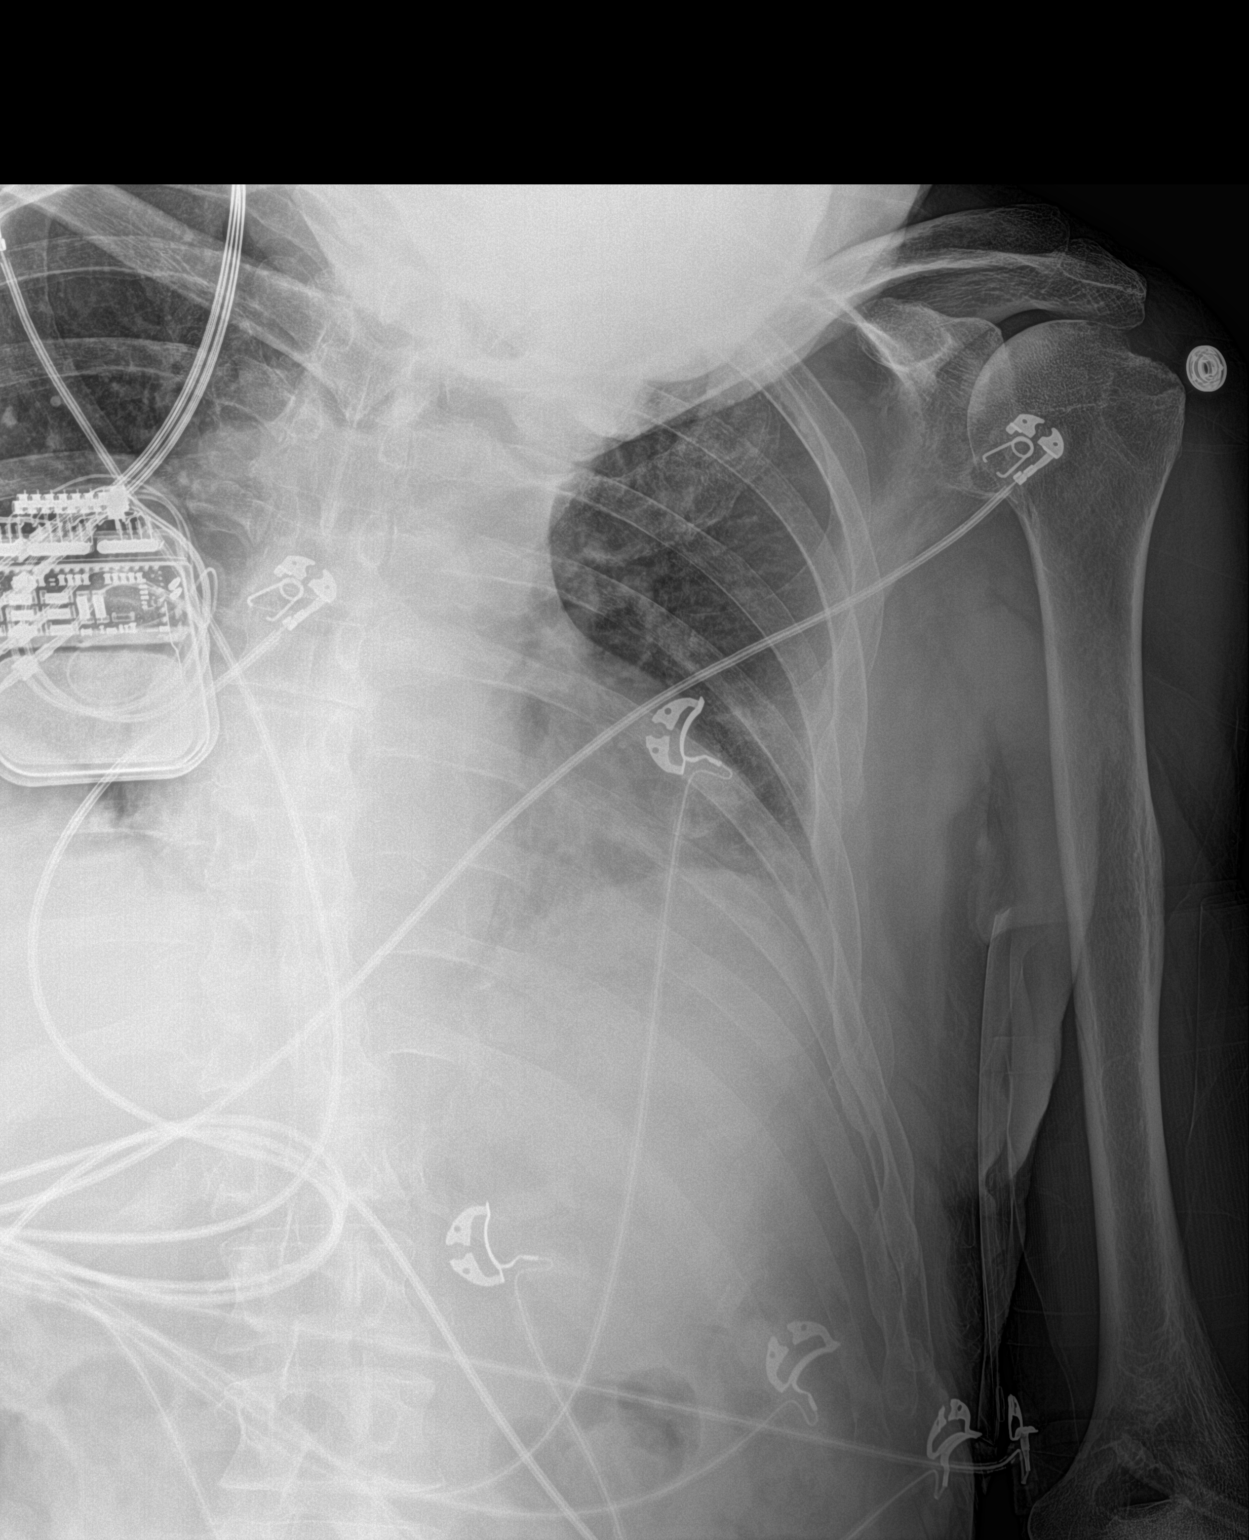

[chest ap]
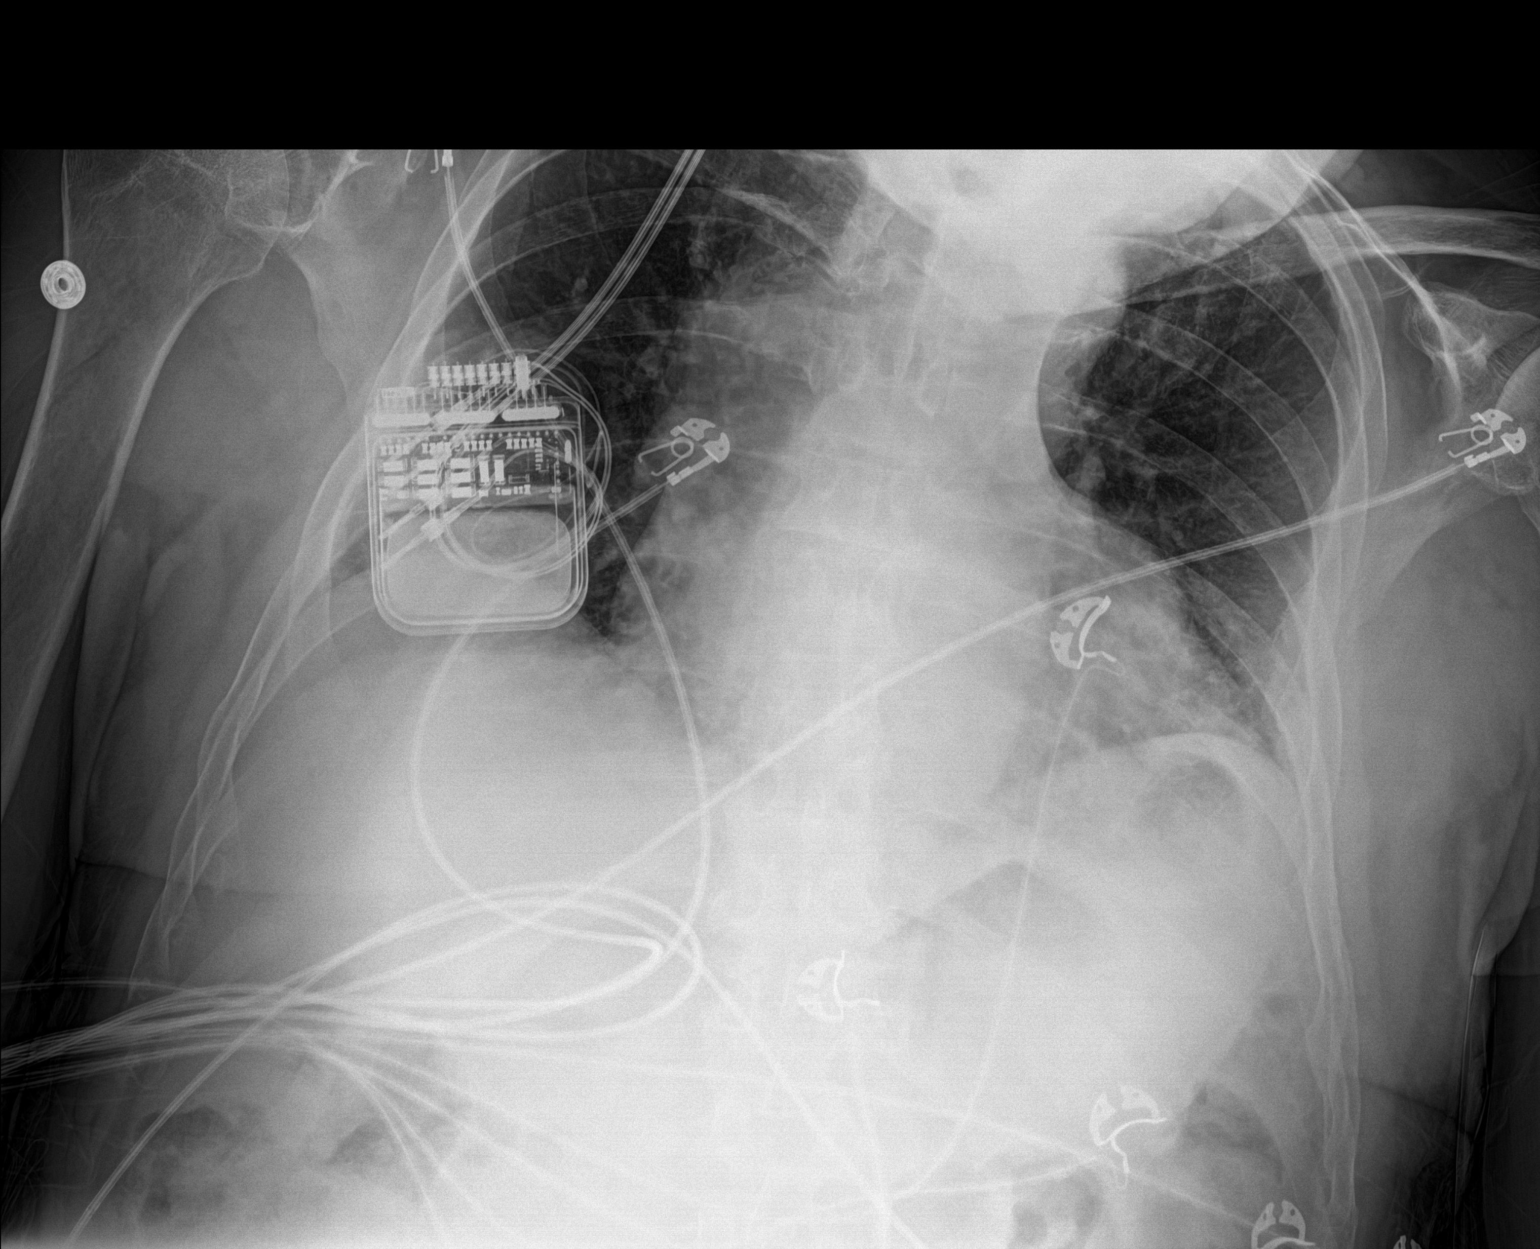

[rib ap]
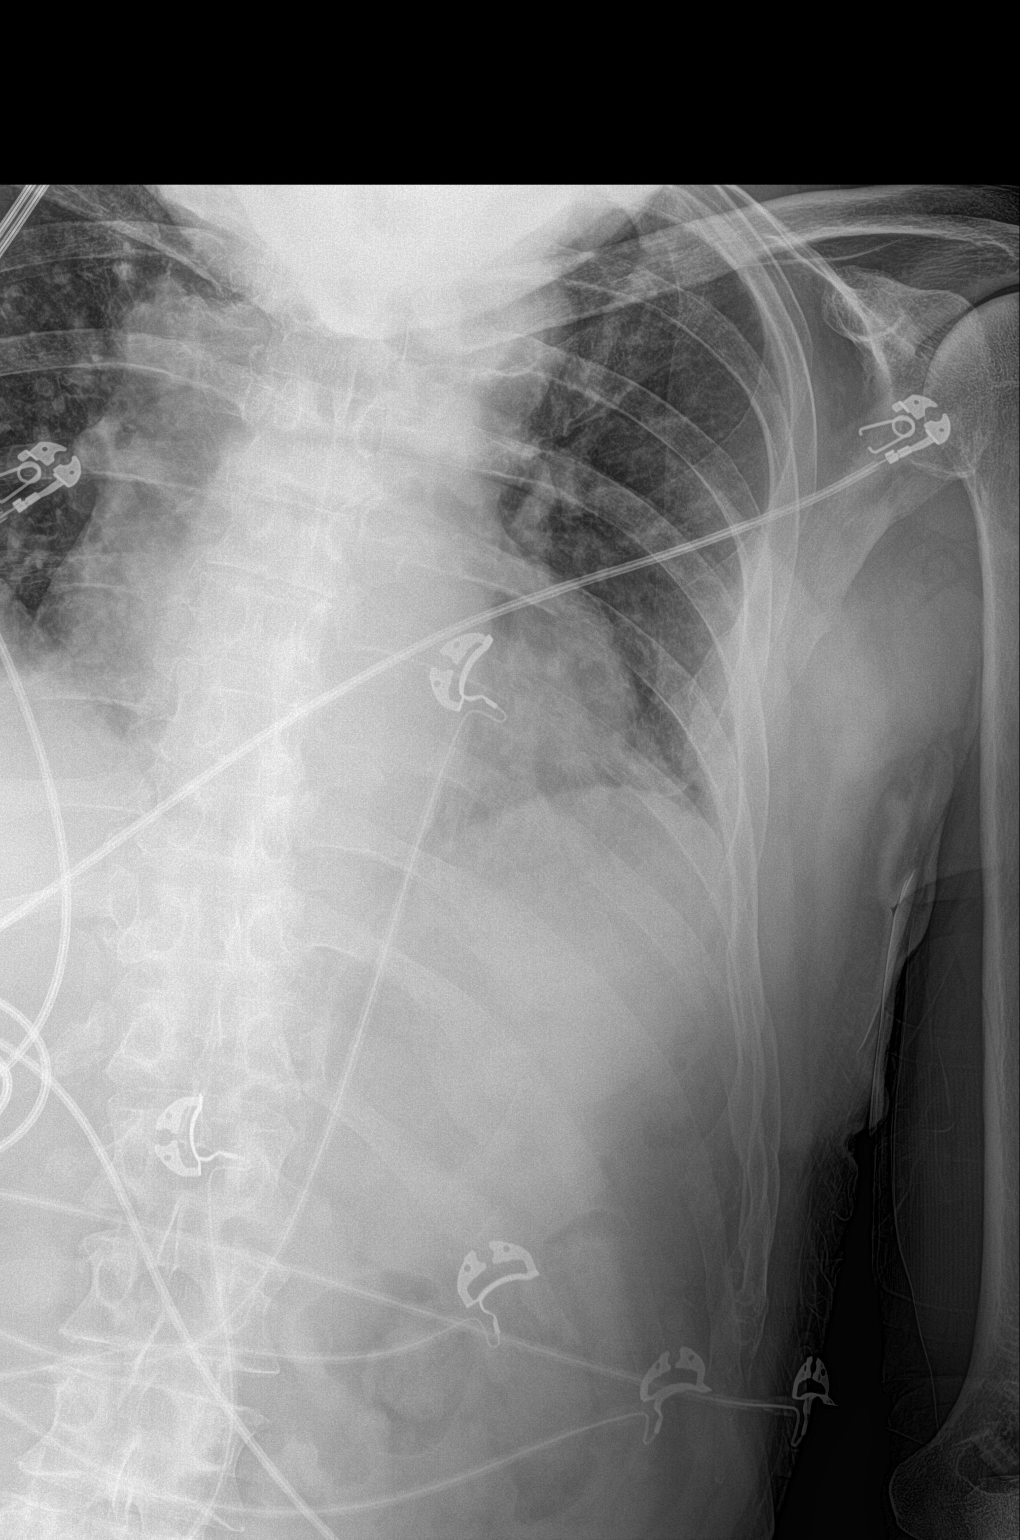

[3 of 3 positions shown; findings below may reference images not displayed]

FINDINGS: Heart is normal size. No confluent airspace opacities, effusions or
pneumothorax. No visible displaced rib fracture.
IMPRESSION: No acute cardiopulmonary disease.

No visible rib fracture.

## 2022-06-05 IMAGING — CT CT CHEST-ABD-PELV W/O CM
2 of 5 series · 14 of 46 positions shown, 16 images · non-contrast
Comparison: Rib series 08/12/2021, CT chest 01/13/2019

CLINICAL DATA: Fall



[Series 4: cap w/o 2.0 mm st · axial · non-contrast · 0.89mm/px · z∈[+894,+1492]mm · 11 of 339 slices shown, 13 images]
[im 20/339  soft-tissue]
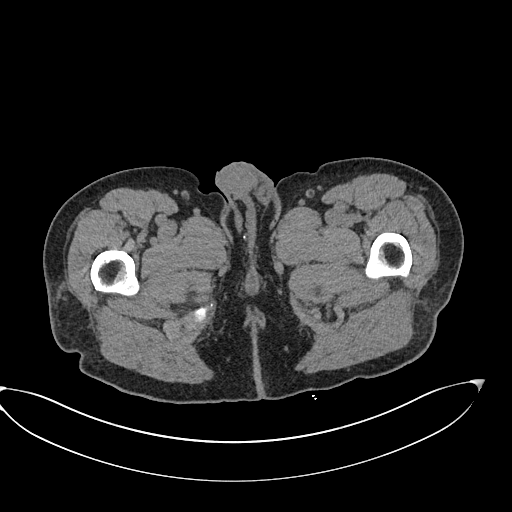
[im 20/339  bone]
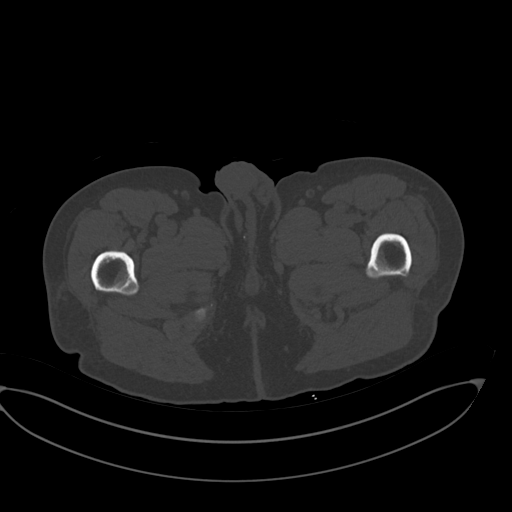
[im 60/339  soft-tissue]
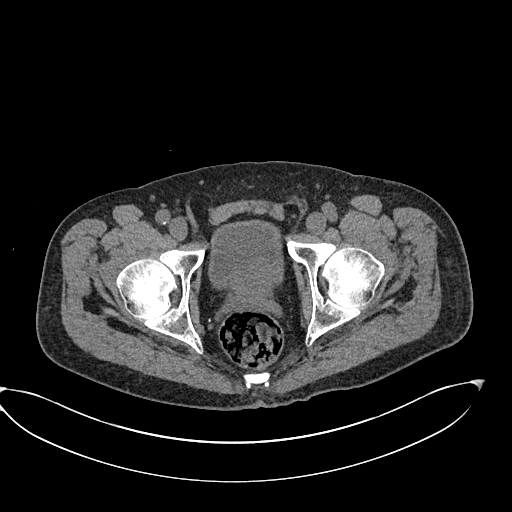
[im 80/339  soft-tissue]
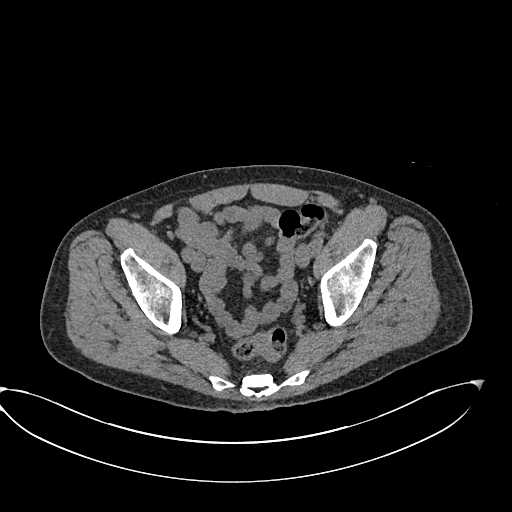
[im 120/339  soft-tissue]
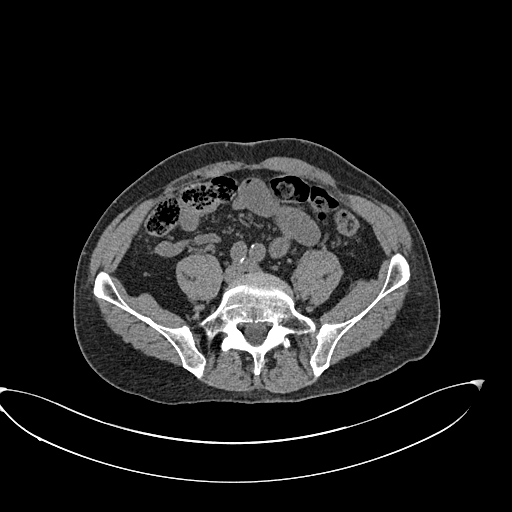
[im 140/339  soft-tissue]
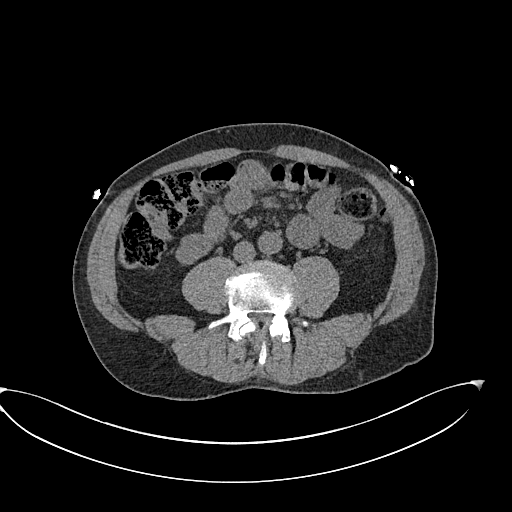
[im 179/339  soft-tissue]
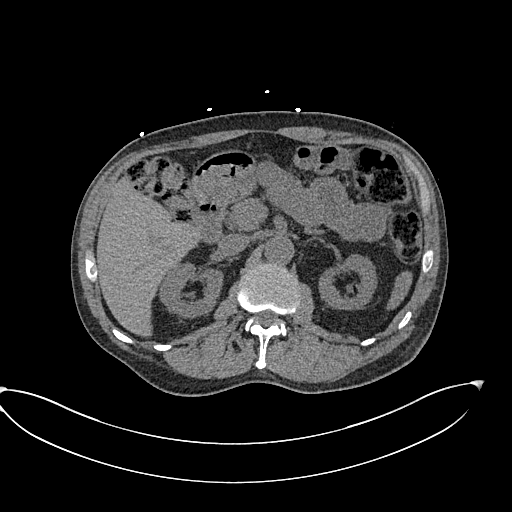
[im 199/339  soft-tissue]
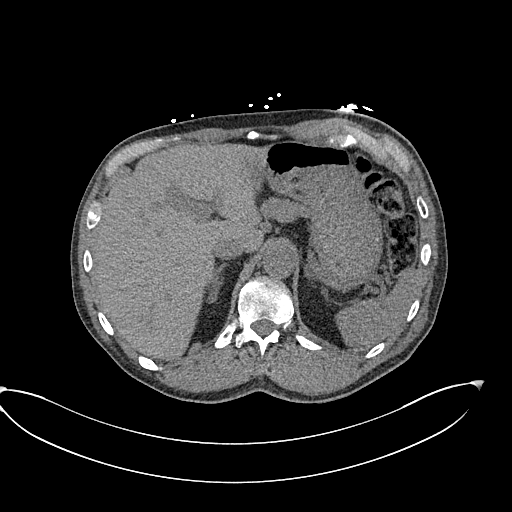
[im 219/339  soft-tissue]
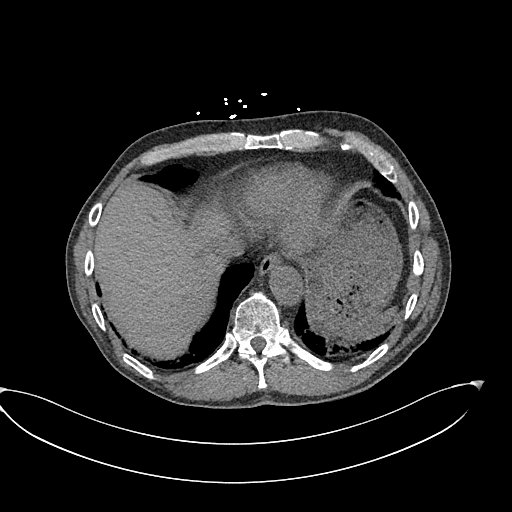
[im 259/339  soft-tissue]
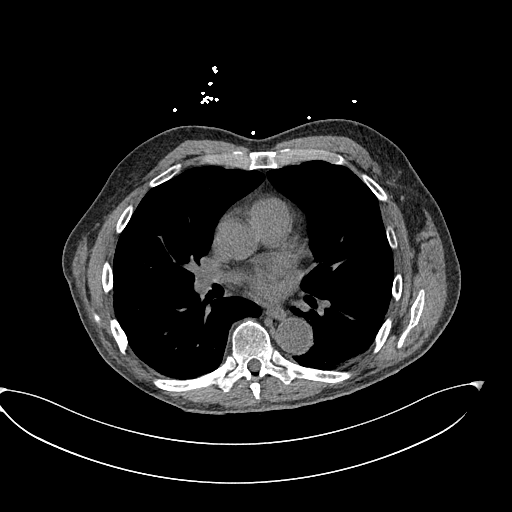
[im 259/339  bone]
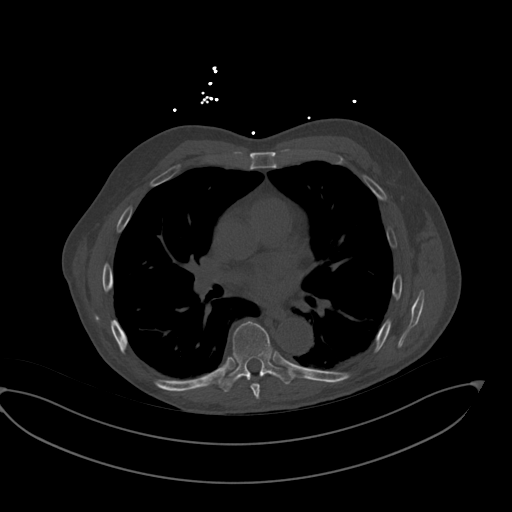
[im 279/339  soft-tissue]
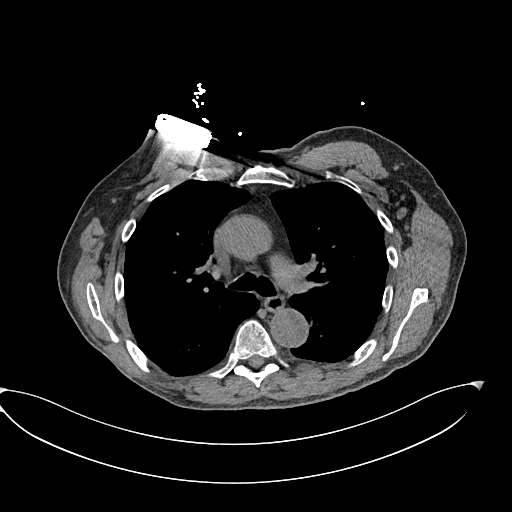
[im 319/339  soft-tissue]
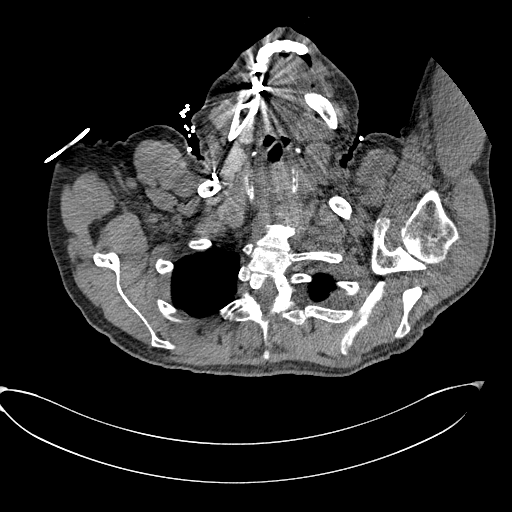

[Series 7: cap w/o 3.0 mm st cor · coronal · non-contrast · 0.74mm/px · 3 of 100 slices shown]
[im 34/100  soft-tissue]
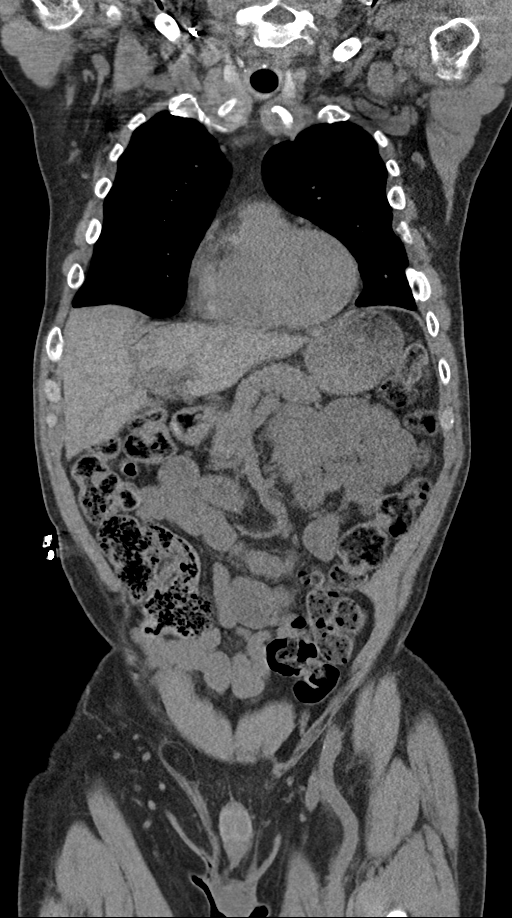
[im 45/100  soft-tissue]
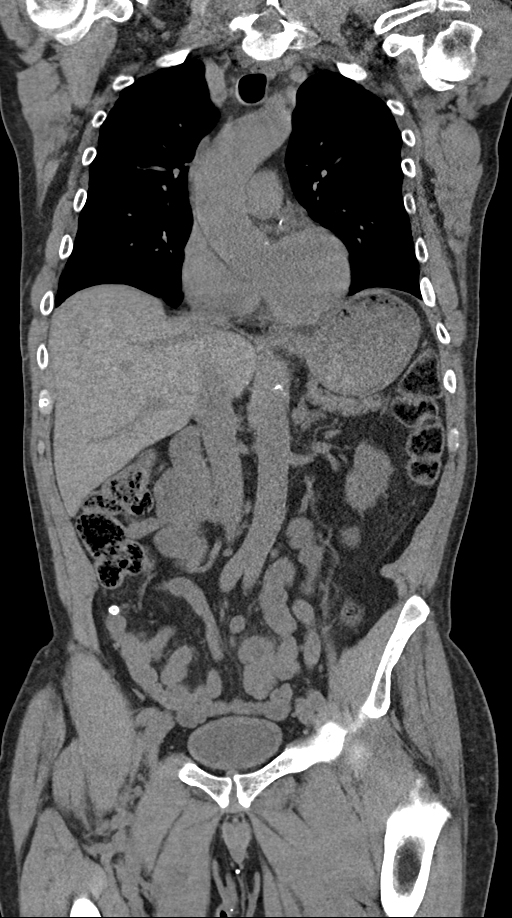
[im 56/100  soft-tissue]
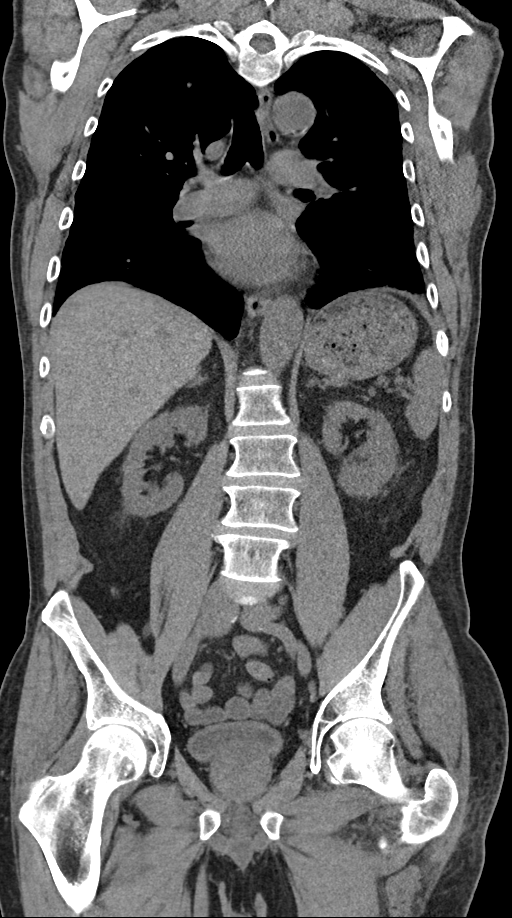

[14 of 46 positions shown; findings below may reference images not displayed]

FINDINGS: CT CHEST FINDINGS

Cardiovascular: Limited evaluation without intravenous contrast.
Mild aortic atherosclerosis. No aneurysm. Coronary vascular
calcification. Normal cardiac size. No pericardial effusion

Mediastinum/Nodes: Midline trachea. No thyroid mass. No suspicious
lymph nodes. Esophagus within normal limits.

Lungs/Pleura: No acute airspace disease, pleural effusion, or
pneumothorax. Minimal linear and ground-glass density at the left
apex.

Musculoskeletal: Sternum is intact. There are multiple chronic
bilateral rib fractures. There is an acute left eleventh
posterolateral rib fracture and in acute left twelfth posterior rib
fracture.

CT ABDOMEN PELVIS FINDINGS

Hepatobiliary: Subcentimeter hypodensity at the right hepatic dome
too small to further characterize. No calcified gallstone or biliary
dilatation.

Pancreas: Unremarkable. No pancreatic ductal dilatation or
surrounding inflammatory changes.

Spleen: Normal in size without focal abnormality.

Adrenals/Urinary Tract: Adrenal glands are normal. Kidneys show no
hydronephrosis. Punctate stone lower pole left kidney. Small stones
in the right kidney, the largest is seen at the midpole and measures
7 mm. The bladder is unremarkable

Stomach/Bowel: Stomach is within normal limits. Hyperdense material
or multiple stones in the appendix but no inflammation. No evidence
of bowel wall thickening, distention, or inflammatory changes.

Vascular/Lymphatic: Mild aortic atherosclerosis. No aneurysm. No
suspicious lymph nodes

Reproductive: Mildly enlarged prostate

Other: Negative for pelvic effusion or free air.

Musculoskeletal: Chronic appearing transverse process fractures of
the lumbar spine. No acute osseous abnormality. Mild edema or
contusion within the right lateral hip soft tissues.
IMPRESSION: 1. No CT evidence for acute traumatic intrathoracic, intra-abdominal
or intrapelvic abnormality allowing for absence of contrast
2. Acute mildly displaced left eleventh and twelfth posterior rib
fractures. No visible pneumothorax
3. Minimal ground-glass density apex suspect post inflammatory
scarring.
4. Nonobstructing kidney stone

## 2022-06-11 DIAGNOSIS — G20A1 Parkinson's disease without dyskinesia, without mention of fluctuations: Secondary | ICD-10-CM | POA: Diagnosis not present

## 2022-06-11 DIAGNOSIS — Z9682 Presence of neurostimulator: Secondary | ICD-10-CM | POA: Diagnosis not present

## 2022-06-11 DIAGNOSIS — K117 Disturbances of salivary secretion: Secondary | ICD-10-CM | POA: Diagnosis not present

## 2022-06-20 ENCOUNTER — Ambulatory Visit: Payer: Medicare PPO | Admitting: Internal Medicine

## 2022-07-24 DIAGNOSIS — M47812 Spondylosis without myelopathy or radiculopathy, cervical region: Secondary | ICD-10-CM | POA: Diagnosis not present

## 2022-07-24 DIAGNOSIS — M47816 Spondylosis without myelopathy or radiculopathy, lumbar region: Secondary | ICD-10-CM | POA: Diagnosis not present

## 2022-07-24 DIAGNOSIS — M5416 Radiculopathy, lumbar region: Secondary | ICD-10-CM | POA: Diagnosis not present

## 2022-08-03 ENCOUNTER — Other Ambulatory Visit: Payer: Self-pay | Admitting: Internal Medicine

## 2022-08-03 ENCOUNTER — Other Ambulatory Visit: Payer: Self-pay | Admitting: Cardiology

## 2022-08-25 ENCOUNTER — Ambulatory Visit (INDEPENDENT_AMBULATORY_CARE_PROVIDER_SITE_OTHER): Payer: Medicare PPO

## 2022-08-25 VITALS — Ht 70.0 in | Wt 170.0 lb

## 2022-08-25 DIAGNOSIS — Z0000 Encounter for general adult medical examination without abnormal findings: Secondary | ICD-10-CM | POA: Diagnosis not present

## 2022-08-25 NOTE — Progress Notes (Signed)
I connected with  John Herring on 08/25/22 by a audio enabled telemedicine application and verified that I am speaking with the correct person using two identifiers.  Patient Location: Home  Provider Location: Office/Clinic  I discussed the limitations of evaluation and management by telemedicine. The patient expressed understanding and agreed to proceed.  Subjective:   John Herring is a 72 y.o. male who presents for Medicare Annual/Subsequent preventive examination.  Review of Systems     Cardiac Risk Factors include: advanced age (>74men, >26 women);male gender;sedentary lifestyle     Objective:    Today's Vitals   08/25/22 1016 08/25/22 1018  Weight: 170 lb (77.1 kg)   Height: 5\' 10"  (1.778 m)   PainSc: 7  7   PainLoc: Shoulder Back   Body mass index is 24.39 kg/m.     08/25/2022   10:25 AM 02/09/2022   11:06 PM 08/12/2021   10:11 PM 08/05/2021    1:53 PM 02/28/2019   11:22 AM 01/20/2019    3:14 PM 01/13/2019   10:25 AM  Advanced Directives  Does Patient Have a Medical Advance Directive? Yes Yes No Yes Yes Yes Yes  Type of Estate agent of [place];Living will   Living will;Healthcare Power of [name] Power of [place];Living will Healthcare Power of [place];Living will Healthcare Power of [place];Living will;Out of facility DNR (pink MOST or yellow form)  Does patient want to make changes to medical advance directive?  Yes (ED - Information included in AVS)  No - Patient declined No - Patient declined No - Patient declined No - Patient declined  Copy of Healthcare Power of Attorney in Chart? No - copy requested   No - copy requested No - copy requested No - copy requested No - copy requested, Physician notified  Would patient like information on creating a medical advance directive?      No - Patient declined No - Patient declined    Current Medications (verified) Outpatient Encounter Medications as of 08/25/2022  Medication Sig    aspirin EC 81 MG tablet Take 1 tablet (81 mg total) by mouth daily.   carbidopa-levodopa (SINEMET IR) 25-100 MG tablet Take 1 tablet by mouth 6 (six) times daily. Taking 1 tablet every 2-3 hours daily   Cholecalciferol (VITAMIN D3) 50 MCG (2000 UT) TABS Take 50 mcg by mouth daily.   clonazePAM (KLONOPIN) 0.5 MG tablet Take 1/2 tab at bedtime x 1 week, then increase to 1 tab at bedtime   cloNIDine (CATAPRES) 0.1 MG tablet Take 0.1 mg by mouth 2 (two) times daily.   COMIRNATY SUSP injection Inject into the muscle.   dextromethorphan (DELSYM) 30 MG/5ML liquid Take by mouth.   ezetimibe (ZETIA) 10 MG tablet TAKE 1 TABLET BY MOUTH EVERY DAY   finasteride (PROSCAR) 5 MG tablet = 1 Tab, ORAL, QEvening, 0 Refill(s), Maintenance   fludrocortisone (FLORINEF) 0.1 MG tablet Take by mouth daily.   lidocaine (LIDODERM) 5 % Place 1 patch onto the skin daily. Remove & Discard patch within 12 hours or as directed by MD   midodrine (PROAMATINE) 5 MG tablet TAKE 1 TABLET BY MOUTH 3 TIMES DAILY WITH MEALS.   mirabegron ER (MYRBETRIQ) 50 MG TB24 tablet Take 1 tablet (50 mg total) by mouth daily.   rivastigmine (EXELON) 9.5 mg/24hr 1 Patch, TOP, DAILY, 0 Refill(s), Maintenance   rosuvastatin (CRESTOR) 5 MG tablet TAKE 1 TABLET (5 MG TOTAL) BY MOUTH DAILY.   triamcinolone cream (KENALOG) 0.1 % Apply 1  application. topically 2 (two) times daily.   No facility-administered encounter medications on file as of 08/25/2022.    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   Skin cancer 05/06/2011   Syncope    orthostatic syncope, no problems in tha last 3-4 months   Past Surgical History:  Procedure Laterality Date   BASAL CELL CARCINOMA EXCISION N/A 03/15/2018   Procedure: RE EXCISION BACK LESION;  Surgeon: Glenna Fellows, MD;  Location: [facility];  Service: Plastics;  Laterality: N/A;   CARDIAC CATHETERIZATION  02/28/2019   COLONOSCOPY  2013   deep brain stimulation     Parkinson's disease   LEFT HEART CATH AND CORONARY ANGIOGRAPHY N/A 02/28/2019   Procedure: LEFT HEART CATH AND CORONARY ANGIOGRAPHY;  Surgeon: Yvonne Kendall, MD;  Location: [facility];  Service: Cardiovascular;  Laterality: N/A;   MASS EXCISION N/A 02/12/2018   Procedure: excision lesion back, layered closure 15 cm;  Surgeon: Glenna Fellows, MD;  Location: [facility];  Service: Plastics;  Laterality: N/A;   SKIN SPLIT GRAFT Left 03/15/2018   Procedure: SKIN GRAFT FROM RIGHT OR LEFT THIGH;  Surgeon: Glenna Fellows, MD;  Location: [facility];  Service: Plastics;  Laterality: Left;   SUBTHALAMIC STIMULATOR BATTERY REPLACEMENT N/A 09/01/2012   Procedure: SUBTHALAMIC STIMULATOR BATTERY REPLACEMENT;  Surgeon: Maeola Harman, MD;  Location: [facility];  Service: Neurosurgery;  Laterality: N/A;  Deep Brain Stimulator battery change   SUBTHALAMIC STIMULATOR BATTERY REPLACEMENT N/A 07/19/2015   Procedure: Deep Brain stimulator battery replacement;  Surgeon: Maeola Harman, MD;  Location: [facility];  Service: Neurosurgery;  Laterality: N/A;  Deep Brain stimulator battery replacement   SUBTHALAMIC STIMULATOR BATTERY REPLACEMENT N/A 01/20/2019   Procedure: Deep brain stimulator battery change;  Surgeon: Maeola Harman, MD;  [facility];  Service: Neurosurgery;  Laterality: N/A;  Deep brain stimulator battery change   TONSILLECTOMY     As a child   Family History  Problem Relation Age of Onset   Colon  cancer Neg Hx    Social History   Socioeconomic History   Marital status: Married    Spouse name: Not on file   Number of children: 2   Years of education: 18   Highest education level: Not on file  Occupational History   Occupation: Disabled  Tobacco Use   Smoking status: Never   Smokeless tobacco: Never  Vaping Use   Vaping Use: Never used  Substance and Sexual Activity   Alcohol use: No    Comment: heavy drinker at times in the past. None for 5 years   Drug use: No   Sexual activity: Not on file  Other Topics Concern   Not on file  Social History Narrative   Lives with wife   Social Determinants of Health   Financial Resource Strain: Low Risk  (08/25/2022)   Overall Financial Resource Strain (CARDIA)    Difficulty of Paying Living Expenses: Not  hard at all  Food Insecurity: No Food Insecurity (08/25/2022)   Hunger Vital Sign    Worried About Running Out of Food in the Last Year: Never true    Ran Out of Food in the Last Year: Never true  Transportation Needs: No Transportation Needs (08/25/2022)   PRAPARE - Administrator, Civil Service (Medical): No    Lack of Transportation (Non-Medical): No  Physical Activity: Inactive (08/25/2022)   Exercise Vital Sign    Days of Exercise per Week: 0 days    Minutes of Exercise per Session: 0 min  Stress: No Stress Concern Present (08/25/2022)   [employer] of Occupational Health - Occupational Stress Questionnaire    Feeling of Stress : Not at all  Social Connections: Socially Integrated (08/25/2022)   Social Connection and Isolation Panel [NHANES]    Frequency of Communication with Friends and Family: More than three times a week    Frequency of Social Gatherings with Friends and Family: More than three times a week    Attends Religious Services: 1 to 4 times per year    Active Member of [employer] or Organizations: No    Attends Engineer, structural: 1 to 4 times per year    Marital Status: Married     Tobacco Counseling Counseling given: Not Answered   Clinical Intake:  Pre-visit preparation completed: Yes  Pain : 0-10 Pain Score: 7  Pain Type: Chronic pain Pain Location: Shoulder Pain Orientation: Left Pain Radiating Towards: back     BMI - recorded: 24.39 Nutritional Status: BMI of 19-24  Normal Nutritional Risks: None Diabetes: No  How often do you need to have someone help you when you read instructions, pamphlets, or other written materials from your doctor or pharmacy?: 1 - Never What is the last grade level you completed in school?: Bachelor's Degree  Diabetic? No  Interpreter Needed?: No  Information entered by :: Susie Cassette, LPN.   Activities of Daily Living    08/25/2022   10:26 AM  In your present state of health, do you have any difficulty performing the following activities:  Hearing? 0  Vision? 0  Difficulty concentrating or making decisions? 1  Walking or climbing stairs? 1  Dressing or bathing? 1  Doing errands, shopping? 1  Preparing Food and eating ? N  Using the Toilet? Y  In the past six months, have you accidently leaked urine? Y  Do you have problems with loss of bowel control? Y  Managing your Medications? N  Managing your Finances? N  Housekeeping or managing your Housekeeping? Y    Patient Care Team: Corwin Levins, MD as PCP - General (Internal Medicine) Jake Bathe, MD as PCP - Cardiology (Cardiology) Gelene Mink, OD as Referring Physician (Optometry)  Indicate any recent Medical Services you may have received from other than [facility] providers in the past year (date may be approximate).     Assessment:   This is a routine wellness examination for [place].  Hearing/Vision screen Hearing Screening - Comments:: Denies hearing difficulties   Vision Screening - Comments:: Wears rx glasses - up to date with routine eye exams with Gelene Mink, OD.   Dietary issues and exercise activities discussed: Current  Exercise Habits: The patient does not participate in regular exercise at present, Exercise limited by: orthopedic condition(s);neurologic condition(s) (Parkinson's Disease)   Goals Addressed             This Visit's Progress    Client understands  the importance of follow-up with providers by attending scheduled visits.       Be as mentally and physically active as possible. Continue going to [facility].      Depression Screen    08/25/2022   10:25 AM 05/30/2022   10:43 AM 05/30/2022   10:29 AM 02/19/2022    2:26 PM 11/26/2021   11:32 AM 08/20/2021    2:07 PM 08/05/2021    2:07 PM  PHQ 2/9 Scores  PHQ - 2 Score 0 0 0 0 4 0 0  PHQ- 9 Score 0  7 0 15 7     Fall Risk    08/25/2022   10:25 AM 05/30/2022   10:29 AM 02/19/2022    2:26 PM 11/26/2021   11:31 AM 08/20/2021    2:07 PM  Fall Risk   Falls in the past year? 1 1 1 1 1   Number falls in past yr: 1 1 1 1 1   Injury with Fall? 1 1 1 1 1   Risk for fall due to : History of fall(s);Impaired balance/gait;Orthopedic patient Impaired balance/gait;Impaired mobility Impaired balance/gait;Impaired mobility History of fall(s);Impaired balance/gait   Follow up Education provided;Falls prevention discussed Falls evaluation completed;Education provided Falls evaluation completed Falls evaluation completed;Education provided;Falls prevention discussed     FALL RISK PREVENTION PERTAINING TO THE HOME:  Any stairs in or around the home? No  If so, are there any without handrails? No  Home free of loose throw rugs in walkways, pet beds, electrical cords, etc? Yes  Adequate lighting in your home to reduce risk of falls? Yes   ASSISTIVE DEVICES UTILIZED TO PREVENT FALLS:  Life alert? No  Use of a cane, walker or w/c? Yes  Grab bars in the bathroom? Yes  Shower chair or bench in shower? Yes  Elevated toilet seat or a handicapped toilet? Yes   TIMED UP AND GO:  Was the test performed? No . Telephonic Visit  Cognitive Function:     08/25/2022   10:55 AM 08/05/2021    2:08 PM  MMSE - Mini Mental State Exam  Not completed: Unable to complete Unable to complete        Immunizations Immunization History  Administered Date(s) Administered   Fluad Quad(high Dose 65+) 01/05/2019, 02/07/2020   Influenza, High Dose Seasonal PF 01/29/2021   Influenza,inj,Quad PF,6+ Mos 02/09/2015   Influenza-Unspecified 12/20/2012, 01/31/2017, 01/02/2018, 02/05/2021, 02/05/2022   PFIZER(Purple Top)SARS-COV-2 Vaccination 05/10/2019, 05/31/2019, 01/17/2020, 09/06/2020, 02/05/2021   PPD Test 12/29/2020   Pfizer Covid-19 Vaccine Bivalent Booster 49yrs & up 02/12/2022   Pneumococcal Conjugate-13 08/08/2019, 12/28/2020   Pneumococcal Polysaccharide-23 08/17/2020   Pneumococcal-Unspecified 12/31/2021, 03/28/2022   Respiratory Syncytial Virus Vaccine,Recomb Aduvanted(Arexvy) 03/03/2022   Td 06/09/2008   Tdap 08/08/2019   Zoster Recombinat (Shingrix) 12/31/2021, 03/11/2022   Zoster, Live 05/03/2001    TDAP status: Up to date  Flu Vaccine status: Up to date  Pneumococcal vaccine status: Up to date  Covid-19 vaccine status: Completed vaccines  Qualifies for Shingles Vaccine? Yes   Zostavax completed Yes   Shingrix Completed?: Yes  Screening Tests Health Maintenance  Topic Date Due   COVID-19 Vaccine (7 - 2023-24 season) 04/09/2022   COLONOSCOPY (Pts 45-56yrs Insurance coverage will need to be confirmed)  02/15/2023 (Originally 01/08/2022)   INFLUENZA VACCINE  11/20/2022   Medicare Annual Wellness (AWV)  08/25/2023   DTaP/Tdap/Td (3 - Td or Tdap) 08/07/2029   Pneumonia Vaccine 58+ Years old  Completed   Hepatitis C Screening  Completed  Zoster Vaccines- Shingrix  Completed   HPV VACCINES  Aged Out    Health Maintenance  Health Maintenance Due  Topic Date Due   COVID-19 Vaccine (7 - 2023-24 season) 04/09/2022    Colorectal cancer screening: Postponed by PCP  Lung Cancer Screening: (Low Dose CT Chest recommended if Age  67-80 years, 30 pack-year currently smoking OR have quit w/in 15years.) does not qualify.   Lung Cancer Screening Referral: No  Additional Screening:  Hepatitis C Screening: does qualify; Completed 06/12/2015  Vision Screening: Recommended annual ophthalmology exams for early detection of glaucoma and other disorders of the eye. Is the patient up to date with their annual eye exam?  Yes  Who is the provider or what is the name of the office in which the patient attends annual eye exams? Gelene Mink, OD. If pt is not established with a provider, would they like to be referred to a provider to establish care? No .   Dental Screening: Recommended annual dental exams for proper oral hygiene  Community Resource Referral / Chronic Care Management: CRR required this visit?  No   CCM required this visit?  No      Plan:     I have personally reviewed and noted the following in the patient's chart:   Medical and social history Use of alcohol, tobacco or illicit drugs  Current medications and supplements including opioid prescriptions. Patient is not currently taking opioid prescriptions. Functional ability and status Nutritional status Physical activity Advanced directives List of other physicians Hospitalizations, surgeries, and ER visits in previous 12 months Vitals Screenings to include cognitive, depression, and falls Referrals and appointments  In addition, I have reviewed and discussed with patient certain preventive protocols, quality metrics, and best practice recommendations. A written personalized care plan for preventive services as well as general preventive health recommendations were provided to patient.     Mickeal Needy, LPN   09/23/7844   Nurse Notes:  Patient has current diagnosis of cognitive impairment. Patient is followed by neurology for ongoing assessment. Patient is unable to complete screening 6CIT or MMSE.

## 2022-08-25 NOTE — Patient Instructions (Addendum)
John Herring , Thank you for taking time to come for your Medicare Wellness Visit. I appreciate your ongoing commitment to your health goals. Please review the following plan we discussed and let me know if I can assist you in the future.   These are the goals we discussed:  Goals      Client understands the importance of follow-up with providers by attending scheduled visits.     Be as mentally and physically active as possible. Continue going to [facility].        This is a list of the screening recommended for you and due dates:  Health Maintenance  Topic Date Due   COVID-19 Vaccine (7 - 2023-24 season) 04/09/2022   Colon Cancer Screening  02/15/2023*   Flu Shot  11/20/2022   Medicare Annual Wellness Visit  08/25/2023   DTaP/Tdap/Td vaccine (3 - Td or Tdap) 08/07/2029   Pneumonia Vaccine  Completed   Hepatitis C Screening: USPSTF Recommendation to screen - Ages 59-79 yo.  Completed   Zoster (Shingles) Vaccine  Completed   HPV Vaccine  Aged Out  *Topic was postponed. The date shown is not the original due date.    Advanced directives: Yes  Conditions/risks identified: Yes  Next appointment: Follow up in one year for your annual wellness visit.   Preventive Care 70 Years and Older, Male  Preventive care refers to lifestyle choices and visits with your health care provider that can promote health and wellness. What does preventive care include? A yearly physical exam. This is also called an annual well check. Dental exams once or twice a year. Routine eye exams. Ask your health care provider how often you should have your eyes checked. Personal lifestyle choices, including: Daily care of your teeth and gums. Regular physical activity. Eating a healthy diet. Avoiding tobacco and drug use. Limiting alcohol use. Practicing safe sex. Taking low doses of aspirin every day. Taking vitamin and mineral supplements as recommended by your health care provider. What  happens during an annual well check? The services and screenings done by your health care provider during your annual well check will depend on your age, overall health, lifestyle risk factors, and family history of disease. Counseling  Your health care provider may ask you questions about your: Alcohol use. Tobacco use. Drug use. Emotional well-being. Home and relationship well-being. Sexual activity. Eating habits. History of falls. Memory and ability to understand (cognition). Work and work Astronomer. Screening  You may have the following tests or measurements: Height, weight, and BMI. Blood pressure. Lipid and cholesterol levels. These may be checked every 5 years, or more frequently if you are over 70 years old. Skin check. Lung cancer screening. You may have this screening every year starting at age 64 if you have a 30-pack-year history of smoking and currently smoke or have quit within the past 15 years. Fecal occult blood test (FOBT) of the stool. You may have this test every year starting at age 75. Flexible sigmoidoscopy or colonoscopy. You may have a sigmoidoscopy every 5 years or a colonoscopy every 10 years starting at age 106. Prostate cancer screening. Recommendations will vary depending on your family history and other risks. Hepatitis C blood test. Hepatitis B blood test. Sexually transmitted disease (STD) testing. Diabetes screening. This is done by checking your blood sugar (glucose) after you have not eaten for a while (fasting). You may have this done every 1-3 years. Abdominal aortic aneurysm (AAA) screening. You may need this  if you are a current or former smoker. Osteoporosis. You may be screened starting at age 22 if you are at high risk. Talk with your health care provider about your test results, treatment options, and if necessary, the need for more tests. Vaccines  Your health care provider may recommend certain vaccines, such as: Influenza vaccine. This  is recommended every year. Tetanus, diphtheria, and acellular pertussis (Tdap, Td) vaccine. You may need a Td booster every 10 years. Zoster vaccine. You may need this after age 73. Pneumococcal 13-valent conjugate (PCV13) vaccine. One dose is recommended after age 58. Pneumococcal polysaccharide (PPSV23) vaccine. One dose is recommended after age 2. Talk to your health care provider about which screenings and vaccines you need and how often you need them. This information is not intended to replace advice given to you by your health care provider. Make sure you discuss any questions you have with your health care provider. Document Released: 05/04/2015 Document Revised: 12/26/2015 Document Reviewed: 02/06/2015 Elsevier Interactive Patient Education  2017 [place] Prevention in the Home Falls can cause injuries. They can happen to people of all ages. There are many things you can do to make your home safe and to help prevent falls. What can I do on the outside of my home? Regularly fix the edges of walkways and driveways and fix any cracks. Remove anything that might make you trip as you walk through a door, such as a raised step or threshold. Trim any bushes or trees on the path to your home. Use bright outdoor lighting. Clear any walking paths of anything that might make someone trip, such as rocks or tools. Regularly check to see if handrails are loose or broken. Make sure that both sides of any steps have handrails. Any raised decks and porches should have guardrails on the edges. Have any leaves, snow, or ice cleared regularly. Use sand or salt on walking paths during winter. Clean up any spills in your garage right away. This includes oil or grease spills. What can I do in the bathroom? Use night lights. Install grab bars by the toilet and in the tub and shower. Do not use towel bars as grab bars. Use non-skid mats or decals in the tub or shower. If you need to sit down  in the shower, use a plastic, non-slip stool. Keep the floor dry. Clean up any water that spills on the floor as soon as it happens. Remove soap buildup in the tub or shower regularly. Attach bath mats securely with double-sided non-slip rug tape. Do not have throw rugs and other things on the floor that can make you trip. What can I do in the bedroom? Use night lights. Make sure that you have a light by your bed that is easy to reach. Do not use any sheets or blankets that are too big for your bed. They should not hang down onto the floor. Have a firm chair that has side arms. You can use this for support while you get dressed. Do not have throw rugs and other things on the floor that can make you trip. What can I do in the kitchen? Clean up any spills right away. Avoid walking on wet floors. Keep items that you use a lot in easy-to-reach places. If you need to reach something above you, use a strong step stool that has a grab bar. Keep electrical cords out of the way. Do not use floor polish or wax that makes floors slippery.  If you must use wax, use non-skid floor wax. Do not have throw rugs and other things on the floor that can make you trip. What can I do with my stairs? Do not leave any items on the stairs. Make sure that there are handrails on both sides of the stairs and use them. Fix handrails that are broken or loose. Make sure that handrails are as long as the stairways. Check any carpeting to make sure that it is firmly attached to the stairs. Fix any carpet that is loose or worn. Avoid having throw rugs at the top or bottom of the stairs. If you do have throw rugs, attach them to the floor with carpet tape. Make sure that you have a light switch at the top of the stairs and the bottom of the stairs. If you do not have them, ask someone to add them for you. What else can I do to help prevent falls? Wear shoes that: Do not have high heels. Have rubber bottoms. Are comfortable  and fit you well. Are closed at the toe. Do not wear sandals. If you use a stepladder: Make sure that it is fully opened. Do not climb a closed stepladder. Make sure that both sides of the stepladder are locked into place. Ask someone to hold it for you, if possible. Clearly mark and make sure that you can see: Any grab bars or handrails. First and last steps. Where the edge of each step is. Use tools that help you move around (mobility aids) if they are needed. These include: Canes. Walkers. Scooters. Crutches. Turn on the lights when you go into a dark area. Replace any light bulbs as soon as they burn out. Set up your furniture so you have a clear path. Avoid moving your furniture around. If any of your floors are uneven, fix them. If there are any pets around you, be aware of where they are. Review your medicines with your doctor. Some medicines can make you feel dizzy. This can increase your chance of falling. Ask your doctor what other things that you can do to help prevent falls. This information is not intended to replace advice given to you by your health care provider. Make sure you discuss any questions you have with your health care provider. Document Released: 02/01/2009 Document Revised: 09/13/2015 Document Reviewed: 05/12/2014 Elsevier Interactive Patient Education  2017 [employer].

## 2022-08-27 DIAGNOSIS — G903 Multi-system degeneration of the autonomic nervous system: Secondary | ICD-10-CM | POA: Diagnosis not present

## 2022-08-27 DIAGNOSIS — R399 Unspecified symptoms and signs involving the genitourinary system: Secondary | ICD-10-CM | POA: Diagnosis not present

## 2022-08-27 DIAGNOSIS — G20A1 Parkinson's disease without dyskinesia, without mention of fluctuations: Secondary | ICD-10-CM | POA: Diagnosis not present

## 2022-08-27 DIAGNOSIS — N184 Chronic kidney disease, stage 4 (severe): Secondary | ICD-10-CM | POA: Diagnosis not present

## 2022-08-27 DIAGNOSIS — D509 Iron deficiency anemia, unspecified: Secondary | ICD-10-CM | POA: Diagnosis not present

## 2022-08-28 ENCOUNTER — Other Ambulatory Visit: Payer: Self-pay | Admitting: Cardiology

## 2022-08-28 DIAGNOSIS — N184 Chronic kidney disease, stage 4 (severe): Secondary | ICD-10-CM | POA: Diagnosis not present

## 2022-08-29 LAB — LAB REPORT - SCANNED
Albumin, Urine POC: 102.2
Creatinine, Urine.: 162.6

## 2022-09-01 ENCOUNTER — Other Ambulatory Visit: Payer: Self-pay | Admitting: Cardiology

## 2022-09-10 ENCOUNTER — Other Ambulatory Visit: Payer: Self-pay | Admitting: Cardiology

## 2022-09-17 DIAGNOSIS — K117 Disturbances of salivary secretion: Secondary | ICD-10-CM | POA: Diagnosis not present

## 2022-10-15 DIAGNOSIS — Z85828 Personal history of other malignant neoplasm of skin: Secondary | ICD-10-CM | POA: Diagnosis not present

## 2022-10-15 DIAGNOSIS — L570 Actinic keratosis: Secondary | ICD-10-CM | POA: Diagnosis not present

## 2022-10-15 DIAGNOSIS — L821 Other seborrheic keratosis: Secondary | ICD-10-CM | POA: Diagnosis not present

## 2022-10-15 DIAGNOSIS — L812 Freckles: Secondary | ICD-10-CM | POA: Diagnosis not present

## 2022-11-20 ENCOUNTER — Encounter: Payer: Self-pay | Admitting: Internal Medicine

## 2022-11-20 ENCOUNTER — Ambulatory Visit: Payer: Medicare PPO | Admitting: Internal Medicine

## 2022-11-20 VITALS — BP 112/60 | HR 71 | Temp 98.5°F | Ht 70.0 in | Wt 162.0 lb

## 2022-11-20 DIAGNOSIS — R739 Hyperglycemia, unspecified: Secondary | ICD-10-CM

## 2022-11-20 DIAGNOSIS — E7800 Pure hypercholesterolemia, unspecified: Secondary | ICD-10-CM | POA: Diagnosis not present

## 2022-11-20 DIAGNOSIS — E559 Vitamin D deficiency, unspecified: Secondary | ICD-10-CM | POA: Diagnosis not present

## 2022-11-20 DIAGNOSIS — N390 Urinary tract infection, site not specified: Secondary | ICD-10-CM | POA: Diagnosis not present

## 2022-11-20 DIAGNOSIS — N1832 Chronic kidney disease, stage 3b: Secondary | ICD-10-CM

## 2022-11-20 DIAGNOSIS — R300 Dysuria: Secondary | ICD-10-CM | POA: Diagnosis not present

## 2022-11-20 LAB — URINALYSIS, ROUTINE W REFLEX MICROSCOPIC
Bilirubin Urine: NEGATIVE
Hgb urine dipstick: NEGATIVE
Ketones, ur: NEGATIVE
Leukocytes,Ua: NEGATIVE
Nitrite: NEGATIVE
RBC / HPF: NONE SEEN (ref 0–?)
Specific Gravity, Urine: 1.02 (ref 1.000–1.030)
Urine Glucose: NEGATIVE
Urobilinogen, UA: 0.2 (ref 0.0–1.0)
pH: 6 (ref 5.0–8.0)

## 2022-11-20 LAB — LIPID PANEL
Cholesterol: 119 mg/dL (ref 0–200)
HDL: 39 mg/dL — ABNORMAL LOW (ref >39.00)
LDL Cholesterol: 61 mg/dL (ref 0–99)
NonHDL: 79.65
Total CHOL/HDL Ratio: 3
Triglycerides: 94 mg/dL (ref 0.0–149.0)
VLDL: 18.8 mg/dL (ref 0.0–40.0)

## 2022-11-20 LAB — TSH: TSH: 2.8 u[IU]/mL (ref 0.35–5.50)

## 2022-11-20 LAB — HEPATIC FUNCTION PANEL
ALT: 2 U/L (ref 0–53)
AST: 11 U/L (ref 0–37)
Albumin: 4.1 g/dL (ref 3.5–5.2)
Alkaline Phosphatase: 109 U/L (ref 39–117)
Bilirubin, Direct: 0.1 mg/dL (ref 0.0–0.3)
Total Bilirubin: 0.3 mg/dL (ref 0.2–1.2)
Total Protein: 6.7 g/dL (ref 6.0–8.3)

## 2022-11-20 NOTE — Progress Notes (Signed)
Patient ID: John Herring, male   DOB: 01/29/1951, 72 y.o.   MRN: [id number]        Chief Complaint: follow up hld, dysuria, ckd, hyperglycemia, low vit d       HPI:  John Herring is a 72 y.o. male here with wife, overall doing ok, Pt denies chest pain, increased sob or doe, wheezing, orthopnea, PND, increased LE swelling, palpitations, dizziness or syncope.  Has had several falls but overall good compliance with PD tx.   Pt denies polydipsia, polyuria, or new focal neuro s/s.    Pt denies fever, wt loss, night sweats, loss of appetite, or other constitutional symptoms  Had recent UTI, now with very mild dysuria, wife brought specimen for [place] to check.  Has urology appt next month.         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   Memory loss 07/12/2012   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   Skin cancer 05/06/2011   Syncope    orthostatic syncope, no problems in tha last 3-4 months   Past Surgical History:  Procedure Laterality Date   BASAL CELL CARCINOMA EXCISION N/A 03/15/2018   Procedure: RE EXCISION BACK LESION;  Surgeon: Glenna Fellows, MD;  Location: [facility];  Service: Plastics;  Laterality: N/A;   CARDIAC CATHETERIZATION  02/28/2019   COLONOSCOPY  2013   deep brain stimulation     Parkinson's disease   LEFT HEART CATH AND CORONARY ANGIOGRAPHY N/A 02/28/2019   Procedure:  LEFT HEART CATH AND CORONARY ANGIOGRAPHY;  Surgeon: Yvonne Kendall, MD;  Location: [facility];  Service: Cardiovascular;  Laterality: N/A;   MASS EXCISION N/A 02/12/2018   Procedure: excision lesion back, layered closure 15 cm;  Surgeon: Glenna Fellows, MD;  Location: [facility];  Service: Plastics;  Laterality: N/A;   SKIN SPLIT GRAFT Left 03/15/2018   Procedure: SKIN GRAFT FROM RIGHT OR LEFT THIGH;  Surgeon: Glenna Fellows, MD;  Location: [facility];  Service: Plastics;  Laterality: Left;   SUBTHALAMIC STIMULATOR BATTERY REPLACEMENT N/A 09/01/2012   Procedure: SUBTHALAMIC STIMULATOR BATTERY REPLACEMENT;  Surgeon: Maeola Harman, MD;  Location: [facility];  Service: Neurosurgery;  Laterality: N/A;  Deep Brain Stimulator battery change   SUBTHALAMIC STIMULATOR BATTERY REPLACEMENT N/A 07/19/2015   Procedure: Deep Brain stimulator battery replacement;  Surgeon: Maeola Harman, MD;  Location: [facility];  Service: Neurosurgery;  Laterality: N/A;  Deep Brain stimulator battery replacement   SUBTHALAMIC STIMULATOR BATTERY REPLACEMENT N/A 01/20/2019   Procedure: Deep brain stimulator battery change;  Surgeon: Maeola Harman, MD;  Location[facility];  Service: Neurosurgery;  Laterality: N/A;  Deep brain stimulator battery change   TONSILLECTOMY     As a child    reports that he has never  smoked. He has never used smokeless tobacco. He reports that he does not drink alcohol and does not use drugs. family history is not on file. Allergies  Allergen Reactions   Statins     Makes parkinson worse, blood pressure drop   Tramadol Other (See Comments)    Confusion upset   Current Outpatient Medications on File Prior to Visit  Medication Sig Dispense Refill   aspirin EC 81 MG tablet Take 1 tablet (81 mg total) by mouth daily. 90 tablet 3   carbidopa-levodopa (SINEMET IR) 25-100 MG tablet Take 1 tablet by mouth 6 (six) times daily. Taking 1 tablet every 2-3 hours daily      Cholecalciferol (VITAMIN D3) 50 MCG (2000 UT) TABS Take 50 mcg by mouth daily.     clonazePAM (KLONOPIN) 0.5 MG tablet Take 1/2 tab at bedtime x 1 week, then increase to 1 tab at bedtime     cloNIDine (CATAPRES) 0.1 MG tablet Take 0.1 mg by mouth 2 (two) times daily.     COMIRNATY SUSP injection Inject into the muscle.     dextromethorphan (DELSYM) 30 MG/5ML liquid Take by mouth.     ezetimibe (ZETIA) 10 MG tablet TAKE 1 TABLET BY MOUTH EVERY DAY 90 tablet 2   finasteride (PROSCAR) 5 MG tablet = 1 Tab, ORAL, QEvening, 0 Refill(s), Maintenance     fludrocortisone (FLORINEF) 0.1 MG tablet Take by mouth daily.     lidocaine (LIDODERM) 5 % Place 1 patch onto the skin daily. Remove & Discard patch within 12 hours or as directed by MD 5 patch 0   midodrine (PROAMATINE) 5 MG tablet TAKE 1 TABLET BY MOUTH 3 TIMES DAILY WITH MEALS. 270 tablet 3   mirabegron ER (MYRBETRIQ) 50 MG TB24 tablet Take 1 tablet (50 mg total) by mouth daily. 90 tablet 3   rivastigmine (EXELON) 9.5 mg/24hr 1 Patch, TOP, DAILY, 0 Refill(s), Maintenance     rosuvastatin (CRESTOR) 5 MG tablet TAKE 1 TABLET (5 MG TOTAL) BY MOUTH DAILY. PLEASE CALL [phone] TO SCHEDULE AN APPOINTMENT FOR FUTURE REFILLS. THANK YOU. 2ND ATTEMPT. 90 tablet 1   triamcinolone cream (KENALOG) 0.1 % Apply 1 application. topically 2 (two) times daily.     No current facility-administered medications on file prior to visit.        ROS:  All others reviewed and negative.  Objective        PE:  BP 112/60 (BP Location: Left Arm, Patient Position: Sitting, Cuff Size: Normal)   Pulse 71   Temp 98.5 F (36.9 C) (Oral)   Ht 5\' 10"  (1.778 m)   Wt 162 lb (73.5 kg)   SpO2 98%   BMI 23.24 kg/m                 Constitutional: Pt appears in NAD               HENT: Head: NCAT.                Right Ear: External ear normal.                 Left Ear: External ear normal.                Eyes: . Pupils are equal, round, and reactive to light. Conjunctivae  and EOM are normal               Nose: without d/c or deformity  Neck: Neck supple. Gross normal ROM               Cardiovascular: Normal rate and regular rhythm.                 Pulmonary/Chest: Effort normal and breath sounds without rales or wheezing.                Abd:  Soft, NT, ND, + BS, no organomegaly               Neurological: Pt is alert. At baseline orientation, motor grossly intact               Skin: Skin is warm. No rashes, no other new lesions, LE edema - none               Psychiatric: Pt behavior is normal without agitation   Micro: none  Cardiac tracings I have personally interpreted today:  none  Pertinent Radiological findings (summarize): none   Lab Results  Component Value Date   WBC 6.7 02/09/2022   HGB 10.0 (L) 02/09/2022   HCT 31.7 (L) 02/09/2022   PLT 235 02/09/2022   GLUCOSE 106 (H) 02/09/2022   CHOL 119 11/20/2022   TRIG 94.0 11/20/2022   HDL 39.00 (L) 11/20/2022   LDLDIRECT 76.0 06/06/2019   LDLCALC 61 11/20/2022   ALT 2 11/20/2022   AST 11 11/20/2022   NA 140 02/09/2022   K 4.1 02/09/2022   CL 110 02/09/2022   CREATININE 2.53 (H) 02/09/2022   BUN 40 (H) 02/09/2022   CO2 20 (L) 02/09/2022   TSH 2.80 11/20/2022   PSA 3.29 08/17/2020   HGBA1C 5.6 08/17/2020   Assessment/Plan:  John Herring is a 72 y.o. White or Caucasian [1] male with  has a past medical history of Arthritis, CKD (chronic kidney disease), stage III (HCC), Depression (05/06/2011), Enlarged prostate, Former consumption of alcohol, GERD (gastroesophageal reflux disease) (12/02/2012), History of kidney stones, Hyperlipidemia (05/06/2011), Hypersomnia (05/06/2011), Hypotension, 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05/06/2011), Memory loss (07/12/2012), Pseudobulbar affect (05/06/2011), Skin cancer (05/06/2011), and Syncope.  Hyperlipidemia Lab Results  Component Value Date   LDLCALC 61 11/20/2022   Stable, pt to continue current statin crestor 5 mg qd   CKD (chronic kidney  disease) stage 3, GFR 30-59 ml/min Lab Results  Component Value Date   CREATININE 2.53 (H) 02/09/2022   Stable overall, cont to avoid nephrotoxins   Hyperglycemia Lab Results  Component Value Date   HGBA1C 5.6 08/17/2020   Stable, pt to continue current medical treatment  - diet, wt control   Vitamin D deficiency Last vitamin D Lab Results  Component Value Date   VD25OH 41.74 08/17/2020   Stable, cont oral replacement   Urinary tract infection, site not specified With recent infection, now with possible mild symptoms, for UA and culture  Followup: Return in about 6 months (around 05/23/2023).  Oliver Barre, MD 11/23/2022 10:04 AM [facility]

## 2022-11-20 NOTE — Patient Instructions (Signed)

## 2022-11-20 NOTE — Progress Notes (Signed)
The test results show that your current treatment is OK, as the tests are stable.  Please continue the same plan.  There is no other need for change of treatment or further evaluation based on these results, at this time.  thanks 

## 2022-11-23 ENCOUNTER — Encounter: Payer: Self-pay | Admitting: Internal Medicine

## 2022-11-23 NOTE — Assessment & Plan Note (Signed)
With recent infection, now with possible mild symptoms, for UA and culture

## 2022-11-23 NOTE — Assessment & Plan Note (Signed)
Lab Results  Component Value Date   HGBA1C 5.6 08/17/2020   Stable, pt to continue current medical treatment  - diet, wt control

## 2022-11-23 NOTE — Assessment & Plan Note (Signed)
Last vitamin D Lab Results  Component Value Date   VD25OH 41.74 08/17/2020   Stable, cont oral replacement  

## 2022-11-23 NOTE — Assessment & Plan Note (Signed)
Lab Results  Component Value Date   CREATININE 2.53 (H) 02/09/2022   Stable overall, cont to avoid nephrotoxins

## 2022-11-23 NOTE — Assessment & Plan Note (Signed)
Lab Results  Component Value Date   LDLCALC 61 11/20/2022   Stable, pt to continue current statin crestor 5 mg qd

## 2022-11-28 ENCOUNTER — Ambulatory Visit: Payer: Medicare PPO | Admitting: Internal Medicine

## 2022-12-08 DIAGNOSIS — R059 Cough, unspecified: Secondary | ICD-10-CM | POA: Diagnosis not present

## 2022-12-08 DIAGNOSIS — J069 Acute upper respiratory infection, unspecified: Secondary | ICD-10-CM | POA: Diagnosis not present

## 2022-12-30 DIAGNOSIS — G20B1 Parkinson's disease with dyskinesia, without mention of fluctuations: Secondary | ICD-10-CM | POA: Diagnosis not present

## 2022-12-30 DIAGNOSIS — Z79899 Other long term (current) drug therapy: Secondary | ICD-10-CM | POA: Diagnosis not present

## 2022-12-30 DIAGNOSIS — G20A1 Parkinson's disease without dyskinesia, without mention of fluctuations: Secondary | ICD-10-CM | POA: Diagnosis not present

## 2022-12-30 DIAGNOSIS — J3489 Other specified disorders of nose and nasal sinuses: Secondary | ICD-10-CM | POA: Diagnosis not present

## 2022-12-30 DIAGNOSIS — Z9682 Presence of neurostimulator: Secondary | ICD-10-CM | POA: Diagnosis not present

## 2023-01-02 DIAGNOSIS — G20A1 Parkinson's disease without dyskinesia, without mention of fluctuations: Secondary | ICD-10-CM | POA: Diagnosis not present

## 2023-01-02 DIAGNOSIS — G903 Multi-system degeneration of the autonomic nervous system: Secondary | ICD-10-CM | POA: Diagnosis not present

## 2023-01-02 DIAGNOSIS — R399 Unspecified symptoms and signs involving the genitourinary system: Secondary | ICD-10-CM | POA: Diagnosis not present

## 2023-01-02 DIAGNOSIS — N1832 Chronic kidney disease, stage 3b: Secondary | ICD-10-CM | POA: Diagnosis not present

## 2023-01-02 DIAGNOSIS — D509 Iron deficiency anemia, unspecified: Secondary | ICD-10-CM | POA: Diagnosis not present

## 2023-01-06 DIAGNOSIS — N1832 Chronic kidney disease, stage 3b: Secondary | ICD-10-CM | POA: Diagnosis not present

## 2023-01-07 LAB — LAB REPORT - SCANNED
Albumin, Urine POC: 83.1
Creatinine, POC: 96 mg/dL
EGFR: 25
Microalb Creat Ratio: 85

## 2023-01-08 DIAGNOSIS — K117 Disturbances of salivary secretion: Secondary | ICD-10-CM | POA: Diagnosis not present

## 2023-01-23 ENCOUNTER — Other Ambulatory Visit: Payer: Self-pay | Admitting: Internal Medicine

## 2023-01-23 DIAGNOSIS — Z1211 Encounter for screening for malignant neoplasm of colon: Secondary | ICD-10-CM

## 2023-01-23 DIAGNOSIS — Z1212 Encounter for screening for malignant neoplasm of rectum: Secondary | ICD-10-CM

## 2023-01-26 ENCOUNTER — Ambulatory Visit: Payer: Medicare PPO | Attending: Cardiology | Admitting: Cardiology

## 2023-01-26 ENCOUNTER — Encounter: Payer: Self-pay | Admitting: Cardiology

## 2023-01-26 VITALS — BP 80/58 | HR 76 | Ht 70.0 in | Wt 159.0 lb

## 2023-01-26 DIAGNOSIS — I951 Orthostatic hypotension: Secondary | ICD-10-CM

## 2023-01-26 DIAGNOSIS — G20A1 Parkinson's disease without dyskinesia, without mention of fluctuations: Secondary | ICD-10-CM | POA: Diagnosis not present

## 2023-01-26 DIAGNOSIS — I2510 Atherosclerotic heart disease of native coronary artery without angina pectoris: Secondary | ICD-10-CM | POA: Diagnosis not present

## 2023-01-26 NOTE — Progress Notes (Signed)
Cardiology Office Note:  .   Date:  01/26/2023  ID:  John Herring, DOB 04-21-1951, MRN [id number] PCP: Corwin Levins, MD  [facility] Providers Cardiologist:  Donato Schultz, MD     History of Present Illness: .   John Herring is a 72 y.o. male Discussed the use of AI scribe software for clinical note transcription with the patient, who gave verbal consent to proceed.  History of Present Illness    The 72 year old patient with a history of hypotension (midodrine and Florinef), hyperlipidemia, prior chest pain, Parkinson's disease with a deep brain stimulator, and a history of bilateral pulmonary embolisms in 2018 post a long flight, presented for a follow-up visit. The patient also has a family history of heart disease on the mother's side. The patient was treated with anticoagulation for six months post-PE and had a left heart catheterization in November 2020, which showed mild coronary plaquing but no significant CAD.  The patient's Parkinson's disease is reportedly stable, with the patient still able to do some walking and participating in armchair exercises. The patient has not had any recent falls of severity. The patient's kidney function, however, is a concern, with a creatinine level of 2.5 a few months ago, possibly worse now. The patient is not considered a good candidate for dialysis due to the taxing nature of the treatment and the patient's already low blood pressure.  The patient's blood pressure was recorded as 80/58 during the visit. The patient is also on a low dose of Crestor and Zetia for mild plaque found during the heart catheterization.           Studies Reviewed: Marland Kitchen   EKG Interpretation Date/Time:  Monday January 26 2023 09:35:57 EDT Ventricular Rate:  76 PR Interval:    QRS Duration:  88 QT Interval:  388 QTC Calculation: 436 R Axis:   -8  Text Interpretation: Normal sinus rhythm Artifact Moderate voltage criteria for LVH, may be normal variant ( R  in aVL , Cornell product ) Septal infarct (cited on or before 26-Jan-2023) Marked ST abnormality, possible inferior subendocardial injury When compared with ECG of 26-Jan-2023 09:35, No significant change since last tracing Confirmed by Donato Schultz ([id number]) on 01/26/2023 9:41:00 AM     Risk Assessment/Calculations:            Physical Exam:   VS:  BP (!) 80/58   Pulse 76   Ht 5\' 10"  (1.778 m)   Wt 159 lb (72.1 kg)   SpO2 98%   BMI 22.81 kg/m    Wt Readings from Last 3 Encounters:  01/26/23 159 lb (72.1 kg)  11/20/22 162 lb (73.5 kg)  08/25/22 170 lb (77.1 kg)    GEN: Well nourished, well developed in no acute distress, in wheelchair NECK: No JVD; No carotid bruits CARDIAC: RRR, no murmurs, no rubs, no gallops RESPIRATORY:  Clear to auscultation without rales, wheezing or rhonchi  ABDOMEN: Soft, non-tender, non-distended EXTREMITIES:  No edema; No deformity   ASSESSMENT AND PLAN: .    Assessment and Plan    Chronic Kidney Disease Creatinine 2.61, GFR 25. Discussed why dialysis in the future and the reasons why it would not be suitable due to the physical demands and hypotension. -Continue current management and monitoring with Dr. Melanee Spry at [facility].  Hypotension Managed with Florinef and Midodrine. Blood pressure today is 80/58, similar to a year ago. -Continue current medications and monitoring, adjusting doses as needed based on blood pressure readings.  Hyperlipidemia Mild coronary plaquing noted on heart catheterization. Currently on low dose Crestor and Zetia. -Continue current medications.  Parkinson's Disease Patient is doing well with current management, able to do some walking and exercises. -Continue current management and supportive care. [facility]. Wife happy with care.   Follow-up in 1 year.               Signed, Donato Schultz, MD

## 2023-01-26 NOTE — Patient Instructions (Signed)

## 2023-03-02 ENCOUNTER — Other Ambulatory Visit: Payer: Self-pay | Admitting: Internal Medicine

## 2023-03-03 ENCOUNTER — Telehealth: Payer: Self-pay | Admitting: Internal Medicine

## 2023-03-03 NOTE — Telephone Encounter (Signed)
Prescription Request  03/03/2023  LOV: 11/20/2022  What is the name of the medication or equipment? ezetimibe (ZETIA) 10 MG tablet   Have you contacted your pharmacy to request a refill? Yes   Which pharmacy would you like this sent to?  [id number] - [place], [place] - [address] [place] [address] Phone: [phone] Fax: [phone]    Patient notified that their request is being sent to the clinical staff for review and that they should receive a response within 2 business days.   Please advise at Mobile [phone] (mobile)

## 2023-03-03 NOTE — Telephone Encounter (Signed)
Refill too soon

## 2023-03-09 ENCOUNTER — Encounter: Payer: Self-pay | Admitting: Internal Medicine

## 2023-03-09 ENCOUNTER — Other Ambulatory Visit: Payer: Self-pay

## 2023-03-09 MED ORDER — EZETIMIBE 10 MG PO TABS: 10.0000 mg | ORAL_TABLET | Freq: Every day | ORAL | 3 refills | Status: AC

## 2023-03-15 ENCOUNTER — Other Ambulatory Visit: Payer: Self-pay | Admitting: Cardiology

## 2023-04-03 DIAGNOSIS — D509 Iron deficiency anemia, unspecified: Secondary | ICD-10-CM | POA: Diagnosis not present

## 2023-04-03 DIAGNOSIS — N1832 Chronic kidney disease, stage 3b: Secondary | ICD-10-CM | POA: Diagnosis not present

## 2023-04-03 DIAGNOSIS — G903 Multi-system degeneration of the autonomic nervous system: Secondary | ICD-10-CM | POA: Diagnosis not present

## 2023-04-03 DIAGNOSIS — R399 Unspecified symptoms and signs involving the genitourinary system: Secondary | ICD-10-CM | POA: Diagnosis not present

## 2023-04-03 DIAGNOSIS — G20A1 Parkinson's disease without dyskinesia, without mention of fluctuations: Secondary | ICD-10-CM | POA: Diagnosis not present

## 2023-04-05 LAB — LAB REPORT - SCANNED
Albumin, Urine POC: 53.6
Creatinine, POC: 59.4 mg/dL
EGFR: 26
Microalb Creat Ratio: 90

## 2023-04-08 DIAGNOSIS — Z85828 Personal history of other malignant neoplasm of skin: Secondary | ICD-10-CM | POA: Diagnosis not present

## 2023-04-08 DIAGNOSIS — L570 Actinic keratosis: Secondary | ICD-10-CM | POA: Diagnosis not present

## 2023-04-08 DIAGNOSIS — L821 Other seborrheic keratosis: Secondary | ICD-10-CM | POA: Diagnosis not present

## 2023-04-08 DIAGNOSIS — D225 Melanocytic nevi of trunk: Secondary | ICD-10-CM | POA: Diagnosis not present

## 2023-05-08 DIAGNOSIS — N401 Enlarged prostate with lower urinary tract symptoms: Secondary | ICD-10-CM | POA: Diagnosis not present

## 2023-05-08 DIAGNOSIS — R350 Frequency of micturition: Secondary | ICD-10-CM | POA: Diagnosis not present

## 2023-05-13 DIAGNOSIS — K117 Disturbances of salivary secretion: Secondary | ICD-10-CM | POA: Diagnosis not present

## 2023-05-25 ENCOUNTER — Ambulatory Visit (INDEPENDENT_AMBULATORY_CARE_PROVIDER_SITE_OTHER)
Admission: RE | Admit: 2023-05-25 | Discharge: 2023-05-25 | Disposition: A | Discharge status: Home / Self Care | Payer: Medicare PPO | Source: Ambulatory Visit | Attending: Internal Medicine | Admitting: Internal Medicine

## 2023-05-25 ENCOUNTER — Ambulatory Visit: Payer: Medicare PPO | Admitting: Internal Medicine

## 2023-05-25 ENCOUNTER — Encounter: Payer: Self-pay | Admitting: Internal Medicine

## 2023-05-25 VITALS — BP 142/86 | HR 76 | Temp 97.8°F | Ht 70.0 in | Wt 166.0 lb

## 2023-05-25 DIAGNOSIS — E7800 Pure hypercholesterolemia, unspecified: Secondary | ICD-10-CM | POA: Diagnosis not present

## 2023-05-25 DIAGNOSIS — E538 Deficiency of other specified B group vitamins: Secondary | ICD-10-CM | POA: Diagnosis not present

## 2023-05-25 DIAGNOSIS — S22040D Wedge compression fracture of fourth thoracic vertebra, subsequent encounter for fracture with routine healing: Secondary | ICD-10-CM

## 2023-05-25 DIAGNOSIS — I951 Orthostatic hypotension: Secondary | ICD-10-CM | POA: Diagnosis not present

## 2023-05-25 DIAGNOSIS — N1832 Chronic kidney disease, stage 3b: Secondary | ICD-10-CM | POA: Diagnosis not present

## 2023-05-25 DIAGNOSIS — E559 Vitamin D deficiency, unspecified: Secondary | ICD-10-CM | POA: Diagnosis not present

## 2023-05-25 DIAGNOSIS — Z0000 Encounter for general adult medical examination without abnormal findings: Secondary | ICD-10-CM

## 2023-05-25 DIAGNOSIS — R739 Hyperglycemia, unspecified: Secondary | ICD-10-CM | POA: Diagnosis not present

## 2023-05-25 DIAGNOSIS — Z0001 Encounter for general adult medical examination with abnormal findings: Secondary | ICD-10-CM | POA: Insufficient documentation

## 2023-05-25 NOTE — Patient Instructions (Signed)
Please schedule the bone density test before leaving today at the scheduling desk (where you check out)  Please continue all other medications as before, and refills have been done if requested.  Please have the pharmacy call with any other refills you may need.  Please continue your efforts at being more active, low cholesterol diet, and weight control.  You are otherwise up to date with prevention measures today.  Please keep your appointments with your specialists as you may have planned  We can hold on further lab testing today  Please make an Appointment to return in 6 months, or sooner if needed

## 2023-05-25 NOTE — Assessment & Plan Note (Signed)
Lab Results  Component Value Date   HGBA1C 5.6 08/17/2020   Stable, pt to continue current medical treatment  - diet, wt control

## 2023-05-25 NOTE — Assessment & Plan Note (Addendum)
Lab Results  Component Value Date   CREATININE 2.53 (H) 02/09/2022   Stable overall, cont to avoid nephrotoxins, f/u renal as planned,

## 2023-05-25 NOTE — Progress Notes (Signed)
Patient ID: John Herring, male   DOB: Dec 30, 1950, 73 y.o.   MRN: [id number]         Chief Complaint:: wellness exam and low b12. Ckd3a, hyperglycemia, hld, low vit d, hx of t4 compression fx       HPI:  John Herring is a 73 y.o. male here for wellness exam; declines covid booster and cologuard, o/w up to date                        Also Pt wife for him denies chest pain, increased sob or doe, wheezing, orthopnea, PND, increased LE swelling, palpitations, dizziness or syncope.  Pt denies polydipsia, polyuria, or new focal neuro s/s.    Pt denies fever, wt loss, night sweats, loss of appetite, or other constitutional symptoms  Has hx of compression fx T4 with fall aug 2023, due for DXA f/u.  Has recent lab with secondary hyperpara with PTH level 100 per wife phone after lab done per renal last wk.     Wt Readings from Last 3 Encounters:  05/25/23 166 lb (75.3 kg)  01/26/23 159 lb (72.1 kg)  11/20/22 162 lb (73.5 kg)   BP Readings from Last 3 Encounters:  05/25/23 (!) 142/86  01/26/23 (!) 80/58  11/20/22 112/60   Immunization History  Administered Date(s) Administered   Fluad Quad(high Dose 65+) 01/05/2019, 02/07/2020   Influenza, High Dose Seasonal PF 01/29/2021   Influenza,inj,Quad PF,6+ Mos 02/09/2015   Influenza-Unspecified 12/20/2012, 01/31/2017, 01/02/2018, 02/05/2021, 02/05/2022, 02/03/2023   PFIZER(Purple Top)SARS-COV-2 Vaccination 05/10/2019, 05/31/2019, 01/17/2020, 09/06/2020, 02/05/2021   PPD Test 12/29/2020   Pfizer Covid-19 Vaccine Bivalent Booster 12yrs & up 02/12/2022   Pneumococcal Conjugate-13 08/08/2019, 12/28/2020   Pneumococcal Polysaccharide-23 08/17/2020   Pneumococcal-Unspecified 12/31/2021, 03/28/2022   Respiratory Syncytial Virus Vaccine,Recomb Aduvanted(Arexvy) 03/03/2022   Td 06/09/2008   Tdap 08/08/2019   Zoster Recombinant(Shingrix) 12/31/2021, 03/11/2022   Zoster, Live 05/03/2001   There are no preventive care reminders to display for this  patient.     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   Skin cancer 05/06/2011   Syncope    orthostatic syncope, no problems in tha last 3-4 months   Past Surgical History:  Procedure Laterality Date   BASAL CELL CARCINOMA EXCISION N/A 03/15/2018   Procedure: RE EXCISION BACK LESION;  Surgeon: Glenna Fellows, MD;  Location: [facility];  Service: Plastics;  Laterality: N/A;   CARDIAC CATHETERIZATION  02/28/2019   COLONOSCOPY  2013   deep brain stimulation     Parkinson's disease   LEFT HEART CATH AND CORONARY ANGIOGRAPHY N/A 02/28/2019   Procedure: LEFT HEART CATH AND CORONARY ANGIOGRAPHY;  Surgeon: Yvonne Kendall, MD;  Location: [facility];  Service: Cardiovascular;  Laterality: N/A;   MASS EXCISION N/A 02/12/2018   Procedure: excision lesion back, layered closure 15 cm;  Surgeon: Glenna Fellows, MD;  Location: [facility];  Service: Plastics;  Laterality: N/A;   SKIN SPLIT GRAFT Left  03/15/2018   Procedure: SKIN GRAFT FROM RIGHT OR LEFT THIGH;  Surgeon: Glenna Fellows, MD;  Location: [facility];  Service: Plastics;  Laterality: Left;   SUBTHALAMIC STIMULATOR BATTERY REPLACEMENT N/A 09/01/2012   Procedure: SUBTHALAMIC STIMULATOR BATTERY REPLACEMENT;  Surgeon: Maeola Harman, MD;  Location: [facility];  Service: Neurosurgery;  Laterality: N/A;  Deep Brain Stimulator battery change   SUBTHALAMIC STIMULATOR BATTERY REPLACEMENT N/A 07/19/2015   Procedure: Deep Brain stimulator battery replacement;   Surgeon: Maeola Harman, MD;  Location: [facility];  Service: Neurosurgery;  Laterality: N/A;  Deep Brain stimulator battery replacement   SUBTHALAMIC STIMULATOR BATTERY REPLACEMENT N/A 01/20/2019   Procedure: Deep brain stimulator battery change;  Surgeon: Maeola Harman, MD;  [facility];  Service: Neurosurgery;  Laterality: N/A;  Deep brain stimulator battery change   TONSILLECTOMY     As a child    reports that he has never smoked. He has never used smokeless tobacco. He reports that he does not drink alcohol and does not use drugs. family history is not on file. Allergies  Allergen Reactions   Statins     Makes parkinson worse, blood pressure drop   Tramadol Other (See Comments)    Confusion upset   Current Outpatient Medications on File Prior to Visit  Medication Sig Dispense Refill   aspirin EC 81 MG tablet Take 1 tablet (81 mg total) by mouth daily. 90 tablet 3   carbidopa-levodopa (SINEMET IR) 25-100 MG tablet Take 1 tablet by mouth 6 (six) times daily. Taking 1 tablet every 2-3 hours daily     Cholecalciferol (VITAMIN D3) 50 MCG (2000 UT) TABS Take 50 mcg by mouth daily.     clonazePAM (KLONOPIN) 0.5 MG tablet Take 1/2 tab at bedtime x 1 week, then increase to 1 tab at bedtime     cloNIDine (CATAPRES) 0.1 MG tablet Take 0.1 mg by mouth 2 (two) times daily.     COMIRNATY SUSP injection Inject into the muscle.     dextromethorphan (DELSYM) 30 MG/5ML liquid Take by mouth.     ezetimibe (ZETIA) 10 MG tablet Take 1 tablet (10 mg total) by mouth daily. 90 tablet 3   finasteride (PROSCAR) 5 MG tablet = 1 Tab, ORAL, QEvening, 0 Refill(s), Maintenance     fludrocortisone (FLORINEF) 0.1 MG tablet Take by mouth daily.     midodrine (PROAMATINE) 5 MG tablet TAKE 1 TABLET BY MOUTH 3 TIMES DAILY WITH MEALS. 270 tablet 3   mirabegron ER (MYRBETRIQ) 50 MG TB24 tablet Take 1 tablet (50 mg total) by mouth daily. 90 tablet 3   rivastigmine (EXELON) 9.5 mg/24hr 1 Patch, TOP, DAILY, 0 Refill(s),  Maintenance     rosuvastatin (CRESTOR) 5 MG tablet TAKE 1 TABLET (5 MG TOTAL) BY MOUTH DAILY. PLEASE CALL [phone] TO SCHEDULE AN APPOINTMENT FOR FUTURE REFILLS. THANK YOU. 2ND ATTEMPT. 90 tablet 2   triamcinolone cream (KENALOG) 0.1 % Apply 1 application. topically 2 (two) times daily.     No current facility-administered medications on file prior to visit.        ROS:  All others reviewed and negative.  Objective        PE:  BP (!) 142/86 (BP Location: Left Arm, Patient Position: Sitting, Cuff Size: Large)   Pulse 76   Temp 97.8 F (36.6 C) (Temporal)   Ht 5\' 10"  (1.778 m)   Wt 166 lb (75.3 kg)   SpO2 98%   BMI 23.82 kg/m  Constitutional: Pt appears in NAD               HENT: Head: NCAT.                Right Ear: External ear normal.                 Left Ear: External ear normal.                Eyes: . Pupils are equal, round, and reactive to light. Conjunctivae and EOM are normal               Nose: without d/c or deformity               Neck: Neck supple. Gross normal ROM               Cardiovascular: Normal rate and regular rhythm.                 Pulmonary/Chest: Effort normal and breath sounds without rales or wheezing.                Abd:  Soft, NT, ND, + BS, no organomegaly               Neurological: Pt is alert. At baseline orientation, motor grossly intact               Skin: Skin is warm. No rashes, no other new lesions, LE edema - none               Psychiatric: Pt behavior is normal without agitation   Micro: none  Cardiac tracings I have personally interpreted today:  none  Pertinent Radiological findings (summarize): none   Lab Results  Component Value Date   WBC 6.7 02/09/2022   HGB 10.0 (L) 02/09/2022   HCT 31.7 (L) 02/09/2022   PLT 235 02/09/2022   GLUCOSE 106 (H) 02/09/2022   CHOL 119 11/20/2022   TRIG 94.0 11/20/2022   HDL 39.00 (L) 11/20/2022   LDLDIRECT 76.0 06/06/2019   LDLCALC 61 11/20/2022   ALT 2 11/20/2022   AST 11  11/20/2022   NA 140 02/09/2022   K 4.1 02/09/2022   CL 110 02/09/2022   CREATININE 2.53 (H) 02/09/2022   BUN 40 (H) 02/09/2022   CO2 20 (L) 02/09/2022   TSH 2.80 11/20/2022   PSA 3.29 08/17/2020   HGBA1C 5.6 08/17/2020   Assessment/Plan:  John Herring is a 73 y.o. White or Caucasian [1] male with  has a past medical history of Arthritis, CKD (chronic kidney disease), stage III (HCC), Depression (05/06/2011), Enlarged prostate, Former consumption of alcohol, GERD (gastroesophageal reflux disease) (12/02/2012), History of kidney stones, Hyperlipidemia (05/06/2011), Hypersomnia (05/06/2011), Hypotension, 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HCC) (05/06/2011), Memory loss (07/12/2012), Pseudobulbar affect (05/06/2011), Skin cancer (05/06/2011), and Syncope.  Encounter for well adult exam with abnormal findings Age and sex appropriate education and counseling updated with regular exercise and diet Referrals for preventative services - declines cologuard Immunizations addressed - declines covid booster Smoking counseling  - none needed Evidence for depression or other mood disorder - none significant Most recent labs reviewed. I have personally reviewed and have noted: 1) the patient's medical and social history 2) The patient's current medications and supplements 3) The patient's height, weight, and BMI have been recorded in the chart   Orthostatic hypotension Symptomatically stable on midodrine and florinef, also on myrbetrix  B12 deficiency Lab Results  Component Value Date   VITAMINB12  518 08/20/2021   Stable, cont oral replacement - b12 1000 mcg qd   CKD (chronic kidney disease) stage 3, GFR 30-59 ml/min Lab Results  Component Value Date   CREATININE 2.53 (H) 02/09/2022   Stable overall, cont to avoid nephrotoxins, f/u renal as planned,  Hyperglycemia Lab Results  Component Value Date   HGBA1C 5.6 08/17/2020   Stable, pt to continue current medical treatment  - diet, wt  control   Hyperlipidemia Lab Results  Component Value Date   LDLCALC 61 11/20/2022   Stable, pt to continue current statin zetia 10 mg, crestor 5 mg   Vitamin D deficiency Last vitamin D Lab Results  Component Value Date   VD25OH 41.74 08/17/2020   Stable, cont oral replacement   Wedge compression fracture of fourth thoracic vertebra, subsequent encounter for fracture with routine healing Also with 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  Oliver Barre, MD 05/25/2023 9:10 PM [facility]

## 2023-05-25 NOTE — Assessment & Plan Note (Signed)
Last vitamin D Lab Results  Component Value Date   VD25OH 41.74 08/17/2020   Stable, cont oral replacement  

## 2023-05-25 NOTE — Assessment & Plan Note (Signed)
Symptomatically stable on midodrine and florinef, also on myrbetrix

## 2023-05-25 NOTE — Assessment & Plan Note (Signed)
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

## 2023-05-25 NOTE — Assessment & Plan Note (Signed)
Lab Results  Component Value Date   LDLCALC 61 11/20/2022   Stable, pt to continue current statin zetia 10 mg, crestor 5 mg

## 2023-05-25 NOTE — Assessment & Plan Note (Signed)
Lab Results  ?Component Value Date  ? [id number] 08/20/2021  ? ?Stable, cont oral replacement - b12 1000 mcg qd ? ?

## 2023-05-25 NOTE — Assessment & Plan Note (Addendum)
Age and sex appropriate education and counseling updated with regular exercise and diet Referrals for preventative services - declines cologuard Immunizations addressed - declines covid booster Smoking counseling  - none needed Evidence for depression or other mood disorder - none significant Most recent labs reviewed. I have personally reviewed and have noted: 1) the patient's medical and social history 2) The patient's current medications and supplements 3) The patient's height, weight, and BMI have been recorded in the chart

## 2023-05-26 ENCOUNTER — Encounter: Payer: Self-pay | Admitting: Internal Medicine

## 2023-06-02 ENCOUNTER — Other Ambulatory Visit: Payer: Self-pay | Admitting: Internal Medicine

## 2023-06-02 DIAGNOSIS — G20A1 Parkinson's disease without dyskinesia, without mention of fluctuations: Secondary | ICD-10-CM

## 2023-06-02 DIAGNOSIS — R296 Repeated falls: Secondary | ICD-10-CM

## 2023-06-18 DIAGNOSIS — H52223 Regular astigmatism, bilateral: Secondary | ICD-10-CM | POA: Diagnosis not present

## 2023-06-18 DIAGNOSIS — H5203 Hypermetropia, bilateral: Secondary | ICD-10-CM | POA: Diagnosis not present

## 2023-06-18 DIAGNOSIS — H524 Presbyopia: Secondary | ICD-10-CM | POA: Diagnosis not present

## 2023-06-18 DIAGNOSIS — H25013 Cortical age-related cataract, bilateral: Secondary | ICD-10-CM | POA: Diagnosis not present

## 2023-06-18 DIAGNOSIS — H2513 Age-related nuclear cataract, bilateral: Secondary | ICD-10-CM | POA: Diagnosis not present

## 2023-06-18 DIAGNOSIS — H35372 Puckering of macula, left eye: Secondary | ICD-10-CM | POA: Diagnosis not present

## 2023-06-18 DIAGNOSIS — H25042 Posterior subcapsular polar age-related cataract, left eye: Secondary | ICD-10-CM | POA: Diagnosis not present

## 2023-07-06 DIAGNOSIS — D509 Iron deficiency anemia, unspecified: Secondary | ICD-10-CM | POA: Diagnosis not present

## 2023-07-06 DIAGNOSIS — E785 Hyperlipidemia, unspecified: Secondary | ICD-10-CM | POA: Diagnosis not present

## 2023-07-06 DIAGNOSIS — R399 Unspecified symptoms and signs involving the genitourinary system: Secondary | ICD-10-CM | POA: Diagnosis not present

## 2023-07-06 DIAGNOSIS — N184 Chronic kidney disease, stage 4 (severe): Secondary | ICD-10-CM | POA: Diagnosis not present

## 2023-07-06 DIAGNOSIS — G903 Multi-system degeneration of the autonomic nervous system: Secondary | ICD-10-CM | POA: Diagnosis not present

## 2023-08-11 ENCOUNTER — Encounter: Payer: Self-pay | Admitting: Internal Medicine

## 2023-08-11 ENCOUNTER — Telehealth: Payer: Self-pay | Admitting: Internal Medicine

## 2023-08-11 DIAGNOSIS — G20A1 Parkinson's disease without dyskinesia, without mention of fluctuations: Secondary | ICD-10-CM

## 2023-08-11 NOTE — Telephone Encounter (Signed)
 Copied from CRM [id number]. Topic: Clinical - Medical Advice >> Aug 11, 2023  1:36 PM Jethro Morrison wrote: Reason for CRM: [facility] WITH [employer] CONTACT #[id number]. PT IS AT A [facility]ENCE C[facility] HOME AND HIS FAMILY IS REQUESTING TO TRANSFER HOSPICE CARE AND WOULD LIKE DR Rosalia Colonel TO BE THERE PROVIDER FOR SUCH. PLEASE CONTACT [facility] WITH [employer] FOR FURTHER INSTRUCTIONS IF THIS CAN BE DONE.

## 2023-08-12 NOTE — Telephone Encounter (Signed)
 Ok referral is done  [facility] for myself as PCP, but also ok for Hospice MD for symptom management   thanks

## 2023-08-24 NOTE — Telephone Encounter (Signed)
 error

## 2023-08-26 ENCOUNTER — Ambulatory Visit (INDEPENDENT_AMBULATORY_CARE_PROVIDER_SITE_OTHER)

## 2023-08-26 VITALS — Ht 70.0 in | Wt 166.0 lb

## 2023-08-26 DIAGNOSIS — Z0000 Encounter for general adult medical examination without abnormal findings: Secondary | ICD-10-CM | POA: Diagnosis not present

## 2023-08-26 NOTE — Progress Notes (Signed)
 Subjective:   John Herring is a 73 y.o. who presents for a Medicare Wellness preventive visit.  Visit Complete: Virtual I connected with  John Herring on 08/26/23 by a audio enabled telemedicine application and verified that I am speaking with the correct person using two identifiers.  Patient Location: Skilled Nursing Facility  Provider Location: Home Office  I discussed the limitations of evaluation and management by telemedicine. The patient expressed understanding and agreed to proceed.  Vital Signs: Because this visit was a virtual/telehealth visit, some criteria may be missing or patient reported. Any vitals not documented were not able to be obtained and vitals that have been documented are patient reported.  VideoDeclined- This patient declined Librarian, academic. Therefore the visit was completed with audio only.  Persons Participating in Visit: Patient assisted by wife.  AWV Questionnaire: No: Patient Medicare AWV questionnaire was not completed prior to this visit.  Cardiac Risk Factors include: advanced age (>52men, >53 women);Other (see comment), Risk factor comments: OSA, CKD,  BPH     Objective:    Today's Vitals   08/26/23 1348  Weight: 166 lb (75.3 kg)  Height: 5\' 10"  (1.778 m)   Body mass index is 23.82 kg/m.     08/26/2023    2:25 PM 08/25/2022   10:25 AM 02/09/2022   11:06 PM 08/12/2021   10:11 PM 08/05/2021    1:53 PM 02/28/2019   11:22 AM 01/20/2019    3:14 PM  Advanced Directives  Does Patient Have a Medical Advance Directive? Yes Yes Yes No Yes Yes Yes  Type of Estate agent of [name];Living will Healthcare Power of [name];Living will   Living will;Healthcare Power of [name] Power of [place];Living will Healthcare Power of [name];Living will  Does patient want to make changes to medical advance directive?   Yes (ED - Information included in AVS)  No - Patient declined No -  Patient declined No - Patient declined  Copy of Healthcare Power of Attorney in Chart? No - copy requested No - copy requested   No - copy requested No - copy requested No - copy requested  Would patient like information on creating a medical advance directive?       No - Patient declined    Current Medications (verified) Outpatient Encounter Medications as of 08/26/2023  Medication Sig   aspirin  EC 81 MG tablet Take 1 tablet (81 mg total) by mouth daily.   carbidopa -levodopa  (SINEMET  IR) 25-100 MG tablet Take 1 tablet by mouth 6 (six) times daily. Taking 1 tablet every 2-3 hours daily   Cholecalciferol (VITAMIN D3) 50 MCG (2000 UT) TABS Take 50 mcg by mouth daily.   clonazePAM (KLONOPIN) 0.5 MG tablet Take 1/2 tab at bedtime x 1 week, then increase to 1 tab at bedtime   cloNIDine (CATAPRES) 0.1 MG tablet Take 0.1 mg by mouth 2 (two) times daily.   COMIRNATY SUSP injection Inject into the muscle.   dextromethorphan (DELSYM) 30 MG/5ML liquid Take by mouth.   ezetimibe  (ZETIA ) 10 MG tablet Take 1 tablet (10 mg total) by mouth daily.   finasteride (PROSCAR) 5 MG tablet = 1 Tab, ORAL, QEvening, 0 Refill(s), Maintenance   fludrocortisone (FLORINEF) 0.1 MG tablet Take by mouth daily.   midodrine  (PROAMATINE ) 5 MG tablet TAKE 1 TABLET BY MOUTH 3 TIMES DAILY WITH MEALS.   mirabegron  ER (MYRBETRIQ ) 50 MG TB24 tablet Take 1 tablet (50 mg total) by mouth daily.   rivastigmine (EXELON)  9.5 mg/24hr 1 Patch, TOP, DAILY, 0 Refill(s), Maintenance   rosuvastatin  (CRESTOR ) 5 MG tablet TAKE 1 TABLET (5 MG TOTAL) BY MOUTH DAILY. PLEASE CALL [phone] TO SCHEDULE AN APPOINTMENT FOR FUTURE REFILLS. THANK YOU. 2ND ATTEMPT.   triamcinolone  cream (KENALOG) 0.1 % Apply 1 application. topically 2 (two) times daily.   No facility-administered encounter medications on file as of 08/26/2023.    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   Skin cancer 05/06/2011   Syncope    orthostatic syncope, no problems in tha last 3-4 months   Past Surgical History:  Procedure Laterality Date   BASAL CELL CARCINOMA EXCISION N/A 03/15/2018   Procedure: RE EXCISION BACK LESION;  Surgeon: Alger Infield, MD;  Location: [facility];  Service: Plastics;  Laterality: N/A;   CARDIAC CATHETERIZATION  02/28/2019   COLONOSCOPY  2013   deep brain stimulation     Parkinson's disease   LEFT HEART CATH AND CORONARY ANGIOGRAPHY N/A 02/28/2019   Procedure: LEFT HEART CATH AND CORONARY ANGIOGRAPHY;  Surgeon: Sammy Crisp, MD;  [facility];  Service: Cardiovascular;  Laterality: N/A;   MASS EXCISION N/A 02/12/2018   Procedure: excision lesion back, layered closure 15 cm;  Surgeon: Alger Infield, MD;  Location: [facility];  Service: Plastics;  Laterality: N/A;   SKIN SPLIT GRAFT Left 03/15/2018   Procedure: SKIN GRAFT FROM RIGHT OR LEFT THIGH;  Surgeon: Alger Infield, MD;  Location: [facility];  Service: Plastics;  Laterality: Left;   SUBTHALAMIC STIMULATOR BATTERY REPLACEMENT N/A 09/01/2012   Procedure: SUBTHALAMIC STIMULATOR BATTERY REPLACEMENT;  Surgeon: Manya Sells, MD;  Location: [facility];  Service: Neurosurgery;  Laterality: N/A;  Deep Brain Stimulator battery change   SUBTHALAMIC STIMULATOR BATTERY REPLACEMENT N/A 07/19/2015   Procedure: Deep Brain stimulator battery replacement;  Surgeon: Manya Sells, MD;  [facility]: [facility];  Service: Neurosurgery;  Laterality:  N/A;  Deep Brain stimulator battery replacement   SUBTHALAMIC STIMULATOR BATTERY REPLACEMENT N/A 01/20/2019   Procedure: Deep brain stimulator battery change;  Surgeon: Manya Sells, MD;  Location: [facility];  Service: Neurosurgery;  Laterality: N/A;  Deep brain stimulator battery change   TONSILLECTOMY     As a child   Family History  Problem Relation Age of Onset   Colon cancer Neg Hx    Social History   Socioeconomic History   Marital status: Married    Spouse name: Tyra Galley   Number of children: 2   Years of education: 18   Highest education level: Bachelor's degree (e.g., BA, AB, BS)  Occupational History   Occupation: Disabled  Tobacco Use   Smoking status: Never   Smokeless tobacco: Never  Vaping Use   Vaping status: Never Used  Substance and Sexual Activity   Alcohol use: No    Comment: heavy drinker at times in the past. 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   Social Drivers of Health   Financial Resource Strain: Patient Unable To Answer (08/26/2023)   Overall Financial Resource Strain (CARDIA)    Difficulty of Paying Living Expenses: Patient unable to answer  Food Insecurity: Patient Unable To Answer (08/26/2023)   Hunger Vital Sign    Worried About Running Out of Food in the Last Year: Patient unable to answer    Ran Out of Food in the Last Year: Patient unable to answer  Transportation Needs: No Transportation Needs (08/26/2023)   PRAPARE - Administrator, Civil Service (Medical): No    Lack of Transportation (Non-Medical): No  Physical Activity: Inactive (08/26/2023)   Exercise Vital Sign    Days of Exercise per Week: 0 days    Minutes of Exercise per Session: 0 min  Stress: Patient Unable To Answer (08/26/2023)   [employer] of Occupational Health - Occupational Stress Questionnaire    Feeling of  Stress : Patient unable to answer  Social Connections: Patient Unable To Answer (08/26/2023)   Social Connection and Isolation Panel [NHANES]    Frequency of Communication with Friends and Family: Patient unable to answer    Frequency of Social Gatherings with Friends and Family: Patient unable to answer    Attends Religious Services: Patient unable to answer    Active Member of Clubs or Organizations: Patient unable to answer    Attends Banker Meetings: Patient unable to answer    Marital Status: Patient unable to answer    Tobacco Counseling Counseling given: Not Answered    Clinical Intake:  Pre-visit preparation completed: Yes  Pain : No/denies pain     BMI - recorded: 23.82 Nutritional Status: BMI of 19-24  Normal Nutritional Risks: None Diabetes: No  Lab Results  Component Value Date   HGBA1C 5.6 08/17/2020   HGBA1C 5.9 06/06/2019   HGBA1C 5.7 06/23/2018     How often do you need to have someone help you when you read instructions, pamphlets, or other written materials from your doctor or pharmacy?: 5 - Always     Comments: Wife helped with visit Information entered by :: Salisa Broz, RMA   Activities of Daily Living     08/26/2023    1:51 PM  In your present state of health, do you have any difficulty performing the following activities:  Hearing? 1  Difficulty concentrating or making decisions? 1  Walking or climbing stairs? 1  Dressing or bathing? 1  Doing errands, shopping? 0  Comment wife drives  Quarry manager and eating ? Y  Using the Toilet? Y  In the past six months, have you accidently leaked urine? Y  Do you have problems with loss of bowel control? Y  Managing your Medications? Y  Managing your Finances? Y  Housekeeping or managing your Housekeeping? Y    Patient Care Team: Roslyn Coombe, MD as PCP - General (Internal Medicine) Hugh Madura, MD as PCP - Cardiology (Cardiology) Guinevere Lefevre, OD as Referring Physician  (Optometry)  Indicate any recent Medical Services you may have received from other than [facility] providers in the past year (date may be approximate).     Assessment:   This is a routine wellness examination for John Herring.  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 1 0 1 1 1   Number falls in past yr: 1 0 1 1 1   Injury with Fall? 0 0 1 1 1   Risk for fall due to : Impaired balance/gait;Medication side effect  History of fall(s);Impaired balance/gait;Orthopedic patient Impaired balance/gait;Impaired mobility Impaired balance/gait;Impaired mobility  Follow up Falls evaluation completed;Falls prevention discussed Falls evaluation completed Education provided;Falls prevention discussed Falls evaluation completed;Education provided Falls evaluation completed    MEDICARE RISK AT HOME:  Medicare Risk at Home Any stairs in or around the home?: No If so, are there any without handrails?: No Home free of loose throw rugs in walkways, pet beds, electrical cords, etc?: Yes Adequate lighting in your home to reduce risk of falls?: Yes Life alert?: Yes Use of a cane, walker or w/c?: Yes (walker) Grab bars in the bathroom?: Yes Shower chair or bench in shower?:  Yes Elevated toilet seat or a handicapped toilet?: Yes  TIMED UP AND GO:  Was the test performed?  No  Cognitive Function: Impaired: Patient has current diagnosis of cognitive impairment.    08/25/2022   10:55 AM 08/05/2021    2:08 PM  MMSE - Mini Mental State Exam  Not completed: Unable to complete Unable to complete        Immunizations Immunization History  Administered Date(s) Administered   Fluad Quad(high Dose 65+) 01/05/2019, 02/07/2020   Influenza, High Dose Seasonal PF 01/29/2021   Influenza,inj,Quad PF,6+ Mos 02/09/2015   Influenza-Unspecified 12/20/2012, 01/31/2017, 01/02/2018, 02/05/2021, 02/05/2022, 02/03/2023   PFIZER(Purple Top)SARS-COV-2 Vaccination 05/10/2019, 05/31/2019, 01/17/2020, 09/06/2020, 02/05/2021   PPD Test 12/29/2020   Pfizer Covid-19 Vaccine Bivalent Booster 4yrs & up 02/12/2022   Pneumococcal Conjugate-13 08/08/2019, 12/28/2020   Pneumococcal Polysaccharide-23 08/17/2020   Pneumococcal-Unspecified 12/31/2021, 03/28/2022   Respiratory Syncytial Virus Vaccine,Recomb Aduvanted(Arexvy) 03/03/2022   Td 06/09/2008   Tdap 08/08/2019   Zoster Recombinant(Shingrix) 12/31/2021, 03/11/2022   Zoster, Live 05/03/2001    Screening Tests Health Maintenance  Topic Date Due   COVID-19 Vaccine (7 - 2024-25 season) 12/21/2022   Fecal DNA (Cologuard)  05/24/2024 (Originally 05/03/1996)   INFLUENZA VACCINE  11/20/2023   DTaP/Tdap/Td (3 - Td or Tdap) 08/07/2029   Pneumonia Vaccine 41+ Years old  Completed   Hepatitis C Screening  Completed   Zoster Vaccines- Shingrix  Completed   HPV VACCINES  Aged Out   Meningococcal B Vaccine  Aged Out   Colonoscopy  Discontinued    Health Maintenance  Health Maintenance Due  Topic Date Due   COVID-19 Vaccine (7 - 2024-25 season) 12/21/2022  Health Maintenance Items Addressed: See Nurse Notes  Additional Screening:  Vision Screening: Recommended annual ophthalmology exams for early detection of glaucoma and  other disorders of the eye.  Dental Screening: Recommended annual dental exams for proper oral hygiene  Community Resource Referral / Chronic Care Management: CRR required this visit?  No   CCM required this visit?  No     Plan:     I have personally reviewed and noted the following in the patient's chart:   Medical and social history Use of alcohol, tobacco or illicit drugs  Current medications and supplements including opioid prescriptions. Patient is not currently taking opioid prescriptions. Functional ability and status Nutritional status Physical activity Advanced directives List of other physicians Hospitalizations, surgeries, and ER visits in previous 12 months Vitals Screenings to include cognitive, depression, and falls Referrals and appointments  In addition, I have reviewed and discussed with patient certain preventive protocols, quality metrics, and best practice recommendations. A written personalized care plan for preventive services as well as general preventive health recommendations were provided to patient.     Ladonya Jerkins L Helyn Schwan, CMA   08/26/2023   After Visit Summary: (MyChart) Due to this being a telephonic visit, the after visit summary with patients personalized plan was offered to patient via MyChart   Notes: Nothing significant to report at this time.  Subjective:   VENUS MORIARITY is a 73 y.o. who presents for a Medicare Wellness preventive visit.  Visit Complete: Virtual I connected with  John Herring on 08/26/23 by a audio enabled telemedicine application and verified that I am speaking with the correct person using two identifiers.  Patient Location: Skilled Nursing Facility  Provider Location: Home Office  I discussed the limitations of evaluation and management by telemedicine. The patient expressed understanding and agreed to proceed.  Vital Signs: Because this visit was a virtual/telehealth visit, some criteria may be missing or  patient reported. Any vitals not documented were not able to be obtained and vitals that have been documented are patient reported.  VideoDeclined- This patient declined Librarian, academic. Therefore the visit was completed with audio only.  Persons Participating in Visit: Patient assisted by his wife.  AWV Questionnaire: No: Patient Medicare AWV questionnaire was not completed prior to this visit.  Cardiac Risk Factors include: advanced age (>47men, >73 women);Other (see comment), Risk factor comments: OSA, CKD,  BPH     Objective:    Today's Vitals   08/26/23 1348  Weight: 166 lb (75.3 kg)  Height: 5\' 10"  (1.778 m)   Body mass index is 23.82 kg/m.     08/26/2023    2:25 PM 08/25/2022   10:25 AM 02/09/2022   11:06 PM 08/12/2021   10:11 PM 08/05/2021    1:53 PM 02/28/2019   11:22 AM 01/20/2019    3:14 PM  Advanced Directives  Does Patient Have a Medical Advance Directive? Yes Yes Yes No Yes Yes Yes  Type of Estate agent of [place];Living will Healthcare Power of [place];Living will   Living will;Healthcare Power of [name] Power of [place];Living will Healthcare Power of [place];Living will  Does patient want to make changes to medical advance directive?   Yes (ED - Information included in AVS)  No - Patient declined No - Patient declined No - Patient declined  Copy of Healthcare Power of Attorney in Chart? No - copy requested No - copy requested   No - copy requested No - copy requested No -  copy requested  Would patient like information on creating a medical advance directive?       No - Patient declined    Current Medications (verified) Outpatient Encounter Medications as of 08/26/2023  Medication Sig   aspirin  EC 81 MG tablet Take 1 tablet (81 mg total) by mouth daily.   carbidopa -levodopa  (SINEMET  IR) 25-100 MG tablet Take 1 tablet by mouth 6 (six) times daily. Taking 1 tablet every 2-3 hours daily    Cholecalciferol (VITAMIN D3) 50 MCG (2000 UT) TABS Take 50 mcg by mouth daily.   clonazePAM (KLONOPIN) 0.5 MG tablet Take 1/2 tab at bedtime x 1 week, then increase to 1 tab at bedtime   cloNIDine (CATAPRES) 0.1 MG tablet Take 0.1 mg by mouth 2 (two) times daily.   COMIRNATY SUSP injection Inject into the muscle.   dextromethorphan (DELSYM) 30 MG/5ML liquid Take by mouth.   ezetimibe  (ZETIA ) 10 MG tablet Take 1 tablet (10 mg total) by mouth daily.   finasteride (PROSCAR) 5 MG tablet = 1 Tab, ORAL, QEvening, 0 Refill(s), Maintenance   fludrocortisone (FLORINEF) 0.1 MG tablet Take by mouth daily.   midodrine  (PROAMATINE ) 5 MG tablet TAKE 1 TABLET BY MOUTH 3 TIMES DAILY WITH MEALS.   mirabegron  ER (MYRBETRIQ ) 50 MG TB24 tablet Take 1 tablet (50 mg total) by mouth daily.   rivastigmine (EXELON) 9.5 mg/24hr 1 Patch, TOP, DAILY, 0 Refill(s), Maintenance   rosuvastatin  (CRESTOR ) 5 MG tablet TAKE 1 TABLET (5 MG TOTAL) BY MOUTH DAILY. PLEASE CALL [phone] TO SCHEDULE AN APPOINTMENT FOR FUTURE REFILLS. THANK YOU. 2ND ATTEMPT.   triamcinolone  cream (KENALOG) 0.1 % Apply 1 application. topically 2 (two) times daily.   No facility-administered encounter medications on file as of 08/26/2023.    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   Skin cancer 05/06/2011   Syncope    orthostatic syncope, no problems in tha last 3-4 months   Past Surgical History:  Procedure Laterality Date   BASAL CELL CARCINOMA EXCISION N/A  03/15/2018   Procedure: RE EXCISION BACK LESION;  Surgeon: Alger Infield, MD;  Location: [facility];  Service: Plastics;  Laterality: N/A;   CARDIAC CATHETERIZATION  02/28/2019   COLONOSCOPY  2013   deep brain stimulation     Parkinson's disease   LEFT HEART CATH AND CORONARY ANGIOGRAPHY N/A 02/28/2019   Procedure: LEFT HEART CATH AND CORONARY ANGIOGRAPHY;  Surgeon: Sammy Crisp, MD;  [facility];  Service: Cardiovascular;  Laterality: N/A;   MASS EXCISION N/A 02/12/2018   Procedure: excision lesion back, layered closure 15 cm;  Surgeon: Alger Infield, MD;  Location: [facility];  Service: Plastics;  Laterality: N/A;   SKIN SPLIT GRAFT Left 03/15/2018   Procedure: SKIN GRAFT FROM RIGHT OR LEFT THIGH;  Surgeon: Alger Infield, MD;  Location: [facility];  Service: Plastics;  Laterality: Left;   SUBTHALAMIC STIMULATOR BATTERY REPLACEMENT N/A 09/01/2012   Procedure: SUBTHALAMIC STIMULATOR BATTERY  REPLACEMENT;  Surgeon: Manya Sells, MD;  [facility];  Service: Neurosurgery;  Laterality: N/A;  Deep Brain Stimulator battery change   SUBTHALAMIC STIMULATOR BATTERY REPLACEMENT N/A 07/19/2015   Procedure: Deep Brain stimulator battery replacement;  Surgeon: Manya Sells, MD;  [facility];  Service: Neurosurgery;  Laterality: N/A;  Deep Brain stimulator battery replacement   SUBTHALAMIC STIMULATOR BATTERY REPLACEMENT N/A 01/20/2019   Procedure: Deep brain stimulator battery change;  Surgeon: Manya Sells, MD;  Location: [facility];  Service: Neurosurgery;  Laterality: N/A;  Deep brain stimulator battery change   TONSILLECTOMY     As a child   Family History  Problem Relation Age of Onset   Colon cancer Neg Hx    Social History   Socioeconomic History   Marital status: Married    Spouse name: Tyra Galley   Number of children: 2   Years of education: 18   Highest education level: Bachelor's degree (e.g., BA, AB,  BS)  Occupational History   Occupation: Disabled  Tobacco Use   Smoking status: Never   Smokeless tobacco: Never  Vaping Use   Vaping status: Never Used  Substance and Sexual Activity   Alcohol use: No    Comment: heavy drinker at times in the past. 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   Social Drivers of Health   Financial Resource Strain: Patient Unable To Answer (08/26/2023)   Overall Financial Resource Strain (CARDIA)    Difficulty of Paying Living Expenses: Patient unable to answer  Food Insecurity: Patient Unable To Answer (08/26/2023)   Hunger Vital Sign    Worried About Running Out of Food in the Last Year: Patient unable to answer    Ran Out of Food in the Last Year: Patient unable to answer  Transportation Needs: No Transportation Needs (08/26/2023)   PRAPARE - Administrator, Civil Service (Medical): No    Lack of Transportation (Non-Medical): No  Physical Activity: Inactive (08/26/2023)   Exercise Vital Sign    Days of Exercise per Week: 0 days    Minutes of Exercise per Session: 0 min  Stress: Patient Unable To Answer (08/26/2023)   [employer] of Occupational Health - Occupational Stress Questionnaire    Feeling of Stress : Patient unable to answer  Social Connections: Patient Unable To Answer (08/26/2023)   Social Connection and Isolation Panel [NHANES]    Frequency of Communication with Friends and Family: Patient unable to answer    Frequency of Social Gatherings with Friends and Family: Patient unable to answer    Attends Religious Services: Patient unable to answer    Active Member of Clubs or Organizations: Patient unable to answer    Attends Banker Meetings: Patient unable to answer    Marital Status: Patient unable to answer    Tobacco Counseling Counseling given: Not  Answered    Clinical Intake:  Pre-visit preparation completed: Yes  Pain : No/denies pain     BMI - recorded: 23.82 Nutritional Status: BMI of 19-24  Normal Nutritional Risks: None Diabetes: No  Lab Results  Component Value Date   HGBA1C 5.6 08/17/2020   HGBA1C 5.9 06/06/2019   HGBA1C 5.7 06/23/2018     How often do you need to have someone help you when you read instructions, pamphlets,  or other written materials from your doctor or pharmacy?: 5 - Always     Comments: Wife helped with visit Information entered by :: Shinika Estelle, RMA   Activities of Daily Living     08/26/2023    1:51 PM  In your present state of health, do you have any difficulty performing the following activities:  Hearing? 1  Difficulty concentrating or making decisions? 1  Walking or climbing stairs? 1  Dressing or bathing? 1  Doing errands, shopping? 0  Comment wife drives  Quarry manager and eating ? Y  Using the Toilet? Y  In the past six months, have you accidently leaked urine? Y  Do you have problems with loss of bowel control? Y  Managing your Medications? Y  Managing your Finances? Y  Housekeeping or managing your Housekeeping? Y    Patient Care Team: Roslyn Coombe, MD as PCP - General (Internal Medicine) Hugh Madura, MD as PCP - Cardiology (Cardiology) Guinevere Lefevre, OD as Referring Physician (Optometry)  Indicate any recent Medical Services you may have received from other than [facility] providers in the past year (date may be approximate).     Assessment:   This is a routine wellness examination for John Herring.  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 1 0 1 1 1   Number falls in past yr: 1 0 1 1 1   Injury with Fall? 0 0 1 1 1   Risk for fall due to : Impaired balance/gait;Medication side effect  History of fall(s);Impaired balance/gait;Orthopedic patient Impaired balance/gait;Impaired mobility Impaired balance/gait;Impaired mobility  Follow up Falls evaluation completed;Falls prevention discussed Falls evaluation completed Education provided;Falls prevention discussed Falls evaluation completed;Education provided Falls evaluation completed    MEDICARE RISK AT HOME:  Medicare Risk at Home Any stairs in or around the home?: No If so, are there any without handrails?: No Home free of loose throw rugs in walkways, pet beds, electrical cords, etc?: Yes Adequate lighting in your home to reduce risk of falls?: Yes Life alert?: Yes Use of a cane, walker or w/c?: Yes (walker) Grab bars in the bathroom?: Yes Shower chair or bench in shower?: Yes Elevated toilet seat or a handicapped toilet?: Yes  TIMED UP AND GO:  Was the test performed?  No  Cognitive Function: Impaired: Patient has current diagnosis of cognitive impairment.    08/25/2022   10:55 AM 08/05/2021    2:08 PM  MMSE - Mini Mental State Exam  Not completed: Unable to complete Unable to complete        Immunizations Immunization History  Administered Date(s) Administered   Fluad Quad(high Dose 65+) 01/05/2019, 02/07/2020   Influenza, High Dose Seasonal PF 01/29/2021   Influenza,inj,Quad PF,6+ Mos 02/09/2015   Influenza-Unspecified 12/20/2012,  01/31/2017, 01/02/2018, 02/05/2021, 02/05/2022, 02/03/2023   PFIZER(Purple Top)SARS-COV-2 Vaccination 05/10/2019, 05/31/2019, 01/17/2020, 09/06/2020, 02/05/2021   PPD Test 12/29/2020   Pfizer Covid-19 Vaccine Bivalent Booster 81yrs & up 02/12/2022   Pneumococcal Conjugate-13 08/08/2019, 12/28/2020   Pneumococcal Polysaccharide-23 08/17/2020   Pneumococcal-Unspecified 12/31/2021, 03/28/2022   Respiratory Syncytial Virus Vaccine,Recomb Aduvanted(Arexvy) 03/03/2022   Td 06/09/2008   Tdap 08/08/2019   Zoster Recombinant(Shingrix) 12/31/2021, 03/11/2022   Zoster, Live 05/03/2001    Screening Tests Health Maintenance  Topic Date Due   COVID-19 Vaccine (7 - 2024-25 season) 12/21/2022   Fecal DNA (Cologuard)  05/24/2024 (Originally 05/03/1996)   INFLUENZA VACCINE  11/20/2023   DTaP/Tdap/Td (3 - Td or Tdap) 08/07/2029   Pneumonia Vaccine 23+ Years old  Completed   Hepatitis C Screening  Completed   Zoster Vaccines- Shingrix  Completed   HPV VACCINES  Aged Out   Meningococcal B Vaccine  Aged Out   Colonoscopy  Discontinued    Health Maintenance  Health Maintenance Due  Topic Date Due   COVID-19 Vaccine (7 - 2024-25 season) 12/21/2022   Health Maintenance Items Addressed: See Nurse Notes  Additional Screening:  Vision Screening: Recommended annual ophthalmology exams for early detection of glaucoma and other disorders of the eye.  Dental Screening: Recommended annual dental exams for proper oral hygiene  Community Resource Referral / Chronic Care Management: CRR required this visit?  No   CCM required this visit?  No     Plan:     I have personally reviewed and noted the following in the patient's chart:   Medical and social history Use of alcohol, tobacco or illicit drugs  Current medications and supplements including opioid prescriptions. Patient is not currently taking opioid prescriptions. Functional ability and status Nutritional status Physical  activity Advanced directives List of other physicians Hospitalizations, surgeries, and ER visits in previous 12 months Vitals Screenings to include cognitive, depression, and falls Referrals and appointments  In addition, I have reviewed and discussed with patient certain preventive protocols, quality metrics, and best practice recommendations. A written personalized care plan for preventive services as well as general preventive health recommendations were provided to patient.     Markita Stcharles L Damyen Knoll, CMA   08/26/2023   After Visit Summary: (MyChart) Due to this being a telephonic visit, the after visit summary with patients personalized plan was offered to patient via MyChart   Notes: Please refer to Routing Comments.

## 2023-08-26 NOTE — Patient Instructions (Signed)
 John Herring , Thank you for taking time to come for your Medicare Wellness Visit. I appreciate your ongoing commitment to your health goals. Please review the following plan we discussed and let me know if I can assist you in the future.   Referrals/Orders/Follow-Ups/Clinician Recommendations: It was nice speaking to you today.  Each day, aim for 6 glasses of water, plenty of protein in your diet and try to get up and walk/ stretch every hour for 5-10 minutes at a time.    This is a list of the screening recommended for you and due dates:  Health Maintenance  Topic Date Due   COVID-19 Vaccine (7 - 2024-25 season) 12/21/2022   Cologuard (Stool DNA test)  05/24/2024*   Flu Shot  11/20/2023   DTaP/Tdap/Td vaccine (3 - Td or Tdap) 08/07/2029   Pneumonia Vaccine  Completed   Hepatitis C Screening  Completed   Zoster (Shingles) Vaccine  Completed   HPV Vaccine  Aged Out   Meningitis B Vaccine  Aged Out   Colon Cancer Screening  Discontinued  *Topic was postponed. The date shown is not the original due date.    Advanced directives: (Copy Requested) Please bring a copy of your health care power of attorney and living will to the office to be added to your chart at your convenience. You can mail to [facility] [address] [place], [place] [address] or email to [email or web address]  Next Medicare Annual Wellness Visit scheduled for next year: Yes  Have you seen your provider in the last 6 months (3 months if uncontrolled diabetes)? Yes.  Last OV on 05/25/23.

## 2023-09-04 LAB — LAB REPORT - SCANNED: EGFR: 26

## 2023-10-19 DIAGNOSIS — L570 Actinic keratosis: Secondary | ICD-10-CM | POA: Diagnosis not present

## 2023-10-19 DIAGNOSIS — Z85828 Personal history of other malignant neoplasm of skin: Secondary | ICD-10-CM | POA: Diagnosis not present

## 2023-10-19 DIAGNOSIS — L821 Other seborrheic keratosis: Secondary | ICD-10-CM | POA: Diagnosis not present

## 2023-10-19 DIAGNOSIS — L218 Other seborrheic dermatitis: Secondary | ICD-10-CM | POA: Diagnosis not present

## 2023-11-23 ENCOUNTER — Ambulatory Visit: Payer: Self-pay | Admitting: Internal Medicine

## 2023-11-23 ENCOUNTER — Ambulatory Visit: Payer: Medicare PPO | Admitting: Internal Medicine

## 2023-11-23 ENCOUNTER — Encounter: Payer: Self-pay | Admitting: Internal Medicine

## 2023-11-23 VITALS — BP 158/108 | HR 64 | Ht 70.0 in | Wt 152.0 lb

## 2023-11-23 DIAGNOSIS — R739 Hyperglycemia, unspecified: Secondary | ICD-10-CM

## 2023-11-23 DIAGNOSIS — F02B0 Dementia in other diseases classified elsewhere, moderate, without behavioral disturbance, psychotic disturbance, mood disturbance, and anxiety: Secondary | ICD-10-CM

## 2023-11-23 DIAGNOSIS — E7800 Pure hypercholesterolemia, unspecified: Secondary | ICD-10-CM | POA: Diagnosis not present

## 2023-11-23 DIAGNOSIS — E559 Vitamin D deficiency, unspecified: Secondary | ICD-10-CM | POA: Diagnosis not present

## 2023-11-23 DIAGNOSIS — N1832 Chronic kidney disease, stage 3b: Secondary | ICD-10-CM | POA: Diagnosis not present

## 2023-11-23 DIAGNOSIS — G20A1 Parkinson's disease without dyskinesia, without mention of fluctuations: Secondary | ICD-10-CM

## 2023-11-23 DIAGNOSIS — E538 Deficiency of other specified B group vitamins: Secondary | ICD-10-CM | POA: Diagnosis not present

## 2023-11-23 LAB — CBC WITH DIFFERENTIAL/PLATELET
Basophils Absolute: 0.1 K/uL (ref 0.0–0.1)
Basophils Relative: 1.7 % (ref 0.0–3.0)
Eosinophils Absolute: 0.2 K/uL (ref 0.0–0.7)
Eosinophils Relative: 2.5 % (ref 0.0–5.0)
HCT: 34.8 % — ABNORMAL LOW (ref 39.0–52.0)
Hemoglobin: 11.8 g/dL — ABNORMAL LOW (ref 13.0–17.0)
Lymphocytes Relative: 24.4 % (ref 12.0–46.0)
Lymphs Abs: 1.7 K/uL (ref 0.7–4.0)
MCHC: 33.8 g/dL (ref 30.0–36.0)
MCV: 92.6 fl (ref 78.0–100.0)
Monocytes Absolute: 0.6 K/uL (ref 0.1–1.0)
Monocytes Relative: 9.2 % (ref 3.0–12.0)
Neutro Abs: 4.2 K/uL (ref 1.4–7.7)
Neutrophils Relative %: 62.2 % (ref 43.0–77.0)
Platelets: 213 K/uL (ref 150.0–400.0)
RBC: 3.76 Mil/uL — ABNORMAL LOW (ref 4.22–5.81)
RDW: 14.6 % (ref 11.5–15.5)
WBC: 6.8 K/uL (ref 4.0–10.5)

## 2023-11-23 LAB — LIPID PANEL
Cholesterol: 130 mg/dL (ref 0–200)
HDL: 49.3 mg/dL (ref >39.00)
LDL Cholesterol: 67 mg/dL (ref 0–99)
NonHDL: 80.25
Total CHOL/HDL Ratio: 3
Triglycerides: 68 mg/dL (ref 0.0–149.0)
VLDL: 13.6 mg/dL (ref 0.0–40.0)

## 2023-11-23 LAB — BASIC METABOLIC PANEL WITH GFR
BUN: 43 mg/dL — ABNORMAL HIGH (ref 6–23)
CO2: 26 meq/L (ref 19–32)
Calcium: 9.2 mg/dL (ref 8.4–10.5)
Chloride: 106 meq/L (ref 96–112)
Creatinine, Ser: 2.69 mg/dL — ABNORMAL HIGH (ref 0.40–1.50)
GFR: 22.93 mL/min — ABNORMAL LOW (ref >60.00)
Glucose, Bld: 105 mg/dL — ABNORMAL HIGH (ref 70–99)
Potassium: 4.6 meq/L (ref 3.5–5.1)
Sodium: 141 meq/L (ref 135–145)

## 2023-11-23 LAB — HEMOGLOBIN A1C: Hgb A1c MFr Bld: 5.7 % (ref 4.6–6.5)

## 2023-11-23 LAB — HEPATIC FUNCTION PANEL
ALT: 2 U/L (ref 0–53)
AST: 9 U/L (ref 0–37)
Albumin: 4.1 g/dL (ref 3.5–5.2)
Alkaline Phosphatase: 81 U/L (ref 39–117)
Bilirubin, Direct: 0.1 mg/dL (ref 0.0–0.3)
Total Bilirubin: 0.4 mg/dL (ref 0.2–1.2)
Total Protein: 6.7 g/dL (ref 6.0–8.3)

## 2023-11-23 LAB — VITAMIN B12: Vitamin B-12: 346 pg/mL (ref 211–911)

## 2023-11-23 LAB — VITAMIN D 25 HYDROXY (VIT D DEFICIENCY, FRACTURES): VITD: 59.78 ng/mL (ref 30.00–100.00)

## 2023-11-23 LAB — TSH: TSH: 2.58 u[IU]/mL (ref 0.35–5.50)

## 2023-11-23 NOTE — Patient Instructions (Signed)

## 2023-11-23 NOTE — Progress Notes (Signed)
 Patient ID: John Herring, male   DOB: 30-Jun-1950, 73 y.o.   MRN: [id number]        Chief Complaint: follow up dementia, hld, hyperglycemia, ckd3a, low b12       HPI:  John Herring is a 73 y.o. male here with wife, now resides at [facility] with palliative hospice; Pt denies chest pain, increased sob or doe, wheezing, orthopnea, PND, increased LE swelling, palpitations, though did have episode of passing out earlier today after getting him up to come to the appt.   Pt denies polydipsia, polyuria, or new focal neuro s/s.    Pt denies fever, night sweats, loss of appetite, or other constitutional symptoms   Lost wt recently for unclear reason, except not taking his ensure currently as before. Dementia overall stable symptomatically, and not assoc with behavioral changes such as hallucinations, paranoia, or agitation. 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   Skin cancer 05/06/2011   Syncope    orthostatic syncope, no problems in tha last 3-4 months   Past Surgical History:  Procedure Laterality Date   BASAL CELL CARCINOMA EXCISION N/A 03/15/2018   Procedure: RE EXCISION BACK LESION;  Surgeon: Arelia Filippo, MD;  Location: [facility];  Service: Plastics;  Laterality: N/A;   CARDIAC CATHETERIZATION  02/28/2019    COLONOSCOPY  2013   deep brain stimulation     Parkinson's disease   LEFT HEART CATH AND CORONARY ANGIOGRAPHY N/A 02/28/2019   Procedure: LEFT HEART CATH AND CORONARY ANGIOGRAPHY;  Surgeon: Mady Bruckner, MD;  Location: [facility];  Service: Cardiovascular;  Laterality: N/A;   MASS EXCISION N/A 02/12/2018   Procedure: excision lesion back, layered closure 15 cm;  Surgeon: Arelia Filippo, MD;  Location: [facility];  Service: Plastics;  Laterality: N/A;   SKIN SPLIT GRAFT Left 03/15/2018   Procedure: SKIN GRAFT FROM RIGHT OR LEFT THIGH;  Surgeon: Arelia Filippo, MD;  Location: [facility];  Service: Plastics;  Laterality: Left;   SUBTHALAMIC STIMULATOR BATTERY REPLACEMENT N/A 09/01/2012   Procedure: SUBTHALAMIC STIMULATOR BATTERY REPLACEMENT;  Surgeon: Fairy Levels, MD;  Location: [facility];  Service: Neurosurgery;  Laterality: N/A;  Deep Brain Stimulator battery change   SUBTHALAMIC STIMULATOR BATTERY REPLACEMENT N/A 07/19/2015   Procedure: Deep Brain stimulator battery replacement;  Surgeon: Fairy Levels, MD;  Location: [facility];  Service: Neurosurgery;  Laterality: N/A;  Deep Brain stimulator battery replacement   SUBTHALAMIC STIMULATOR BATTERY REPLACEMENT N/A 01/20/2019   Procedure: Deep brain stimulator battery change;  Surgeon: Levels Fairy, MD;  Location: [facility];  Service: Neurosurgery;  Laterality: N/A;  Deep brain stimulator battery change   TONSILLECTOMY     As a  child    reports that he has never smoked. He has never used smokeless tobacco. He reports that he does not drink alcohol and does not use drugs. family history is not on file. Allergies  Allergen Reactions   Statins     Makes parkinson worse, blood pressure drop   Tramadol  Other (See Comments)    Confusion upset   Current Outpatient Medications on File Prior to Visit  Medication Sig Dispense Refill   aspirin  EC 81 MG tablet Take 1 tablet (81 mg total) by mouth daily. 90  tablet 3   carbidopa -levodopa  (SINEMET  IR) 25-100 MG tablet Take 1 tablet by mouth 6 (six) times daily. Taking 1 tablet every 2-3 hours daily     Cholecalciferol (VITAMIN D3) 50 MCG (2000 UT) TABS Take 50 mcg by mouth daily.     clonazePAM (KLONOPIN) 0.5 MG tablet Take 1/2 tab at bedtime x 1 week, then increase to 1 tab at bedtime     cloNIDine (CATAPRES) 0.1 MG tablet Take 0.1 mg by mouth 2 (two) times daily.     COMIRNATY SUSP injection Inject into the muscle.     dextromethorphan (DELSYM) 30 MG/5ML liquid Take by mouth.     ezetimibe  (ZETIA ) 10 MG tablet Take 1 tablet (10 mg total) by mouth daily. 90 tablet 3   finasteride (PROSCAR) 5 MG tablet = 1 Tab, ORAL, QEvening, 0 Refill(s), Maintenance     fludrocortisone (FLORINEF) 0.1 MG tablet Take by mouth daily.     midodrine  (PROAMATINE ) 5 MG tablet TAKE 1 TABLET BY MOUTH 3 TIMES DAILY WITH MEALS. 270 tablet 3   mirabegron  ER (MYRBETRIQ ) 50 MG TB24 tablet Take 1 tablet (50 mg total) by mouth daily. 90 tablet 3   rivastigmine (EXELON) 9.5 mg/24hr 1 Patch, TOP, DAILY, 0 Refill(s), Maintenance     rosuvastatin  (CRESTOR ) 5 MG tablet TAKE 1 TABLET (5 MG TOTAL) BY MOUTH DAILY. PLEASE CALL [phone] TO SCHEDULE AN APPOINTMENT FOR FUTURE REFILLS. THANK YOU. 2ND ATTEMPT. 90 tablet 2   triamcinolone  cream (KENALOG) 0.1 % Apply 1 application. topically 2 (two) times daily.     No current facility-administered medications on file prior to visit.        ROS:  All others reviewed and negative.  Objective        PE:  BP (!) 158/108   Pulse 64   Ht 5' 10 (1.778 m)   Wt 152 lb (68.9 kg)   SpO2 97%   BMI 21.81 kg/m                 Constitutional: Pt appears in NAD in wheelchair               HENT: Head: NCAT.                Right Ear: External ear normal.                 Left Ear: External ear normal.                Eyes: . Pupils are equal, round, and reactive to light. Conjunctivae and EOM are normal               Nose: without d/c or  deformity               Neck: Neck supple. Gross normal ROM               Cardiovascular: Normal rate and regular rhythm.  Pulmonary/Chest: Effort normal and breath sounds without rales or wheezing.                Abd:  Soft, NT, ND, + BS, no organomegaly               Neurological: Pt is alert. At baseline orientation, motor grossly intact               Skin: Skin is warm. No rashes, no other new lesions, LE edema - none               Psychiatric: Pt behavior is normal without agitation   Micro: none  Cardiac tracings I have personally interpreted today:  none  Pertinent Radiological findings (summarize): none   Lab Results  Component Value Date   WBC 6.7 02/09/2022   HGB 10.0 (L) 02/09/2022   HCT 31.7 (L) 02/09/2022   PLT 235 02/09/2022   GLUCOSE 106 (H) 02/09/2022   CHOL 119 11/20/2022   TRIG 94.0 11/20/2022   HDL 39.00 (L) 11/20/2022   LDLDIRECT 76.0 06/06/2019   LDLCALC 61 11/20/2022   ALT 2 11/20/2022   AST 11 11/20/2022   NA 140 02/09/2022   K 4.1 02/09/2022   CL 110 02/09/2022   CREATININE 2.53 (H) 02/09/2022   BUN 40 (H) 02/09/2022   CO2 20 (L) 02/09/2022   TSH 2.80 11/20/2022   PSA 3.29 08/17/2020   HGBA1C 5.6 08/17/2020   Assessment/Plan:  John Herring is a 73 y.o. White or Caucasian [1] male with  has a past medical history of Arthritis, CKD (chronic kidney disease), stage III (HCC), Depression (05/06/2011), Enlarged prostate, Former consumption of alcohol, GERD (gastroesophageal reflux disease) (12/02/2012), History of kidney stones, Hyperlipidemia (05/06/2011), Hypersomnia (05/06/2011), Hypotension, 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HCC) (05/06/2011), Memory loss (07/12/2012), Pseudobulbar affect (05/06/2011), Skin cancer (05/06/2011), and Syncope.  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, without behavioral disturbance, psychotic disturbance, mood disturbance, or anxiety (HCC) Stable, cont current med tx   Vitamin D  deficiency Last vitamin  D Lab Results  Component Value Date   VD25OH 41.74 08/17/2020   Stable, cont oral replacement   Hyperlipidemia Lab Results  Component Value Date   LDLCALC 61 11/20/2022   Stable, pt to continue current statin zetia  10 mg and crestor  5 mg every day, for f/u lab today   Hyperglycemia Lab Results  Component Value Date   HGBA1C 5.6 08/17/2020   Stable, pt to continue current medical treatment  - diet, wt control   CKD (chronic kidney disease) stage 3, GFR 30-59 ml/min Lab Results  Component Value Date   CREATININE 2.53 (H) 02/09/2022   Stable overall, cont to avoid nephrotoxins, for f/u lab, f/u renal as planned  B12 deficiency Lab Results  Component Value Date   VITAMINB12 518 08/20/2021   Stable, cont oral replacement - b12 1000 mcg qd  Followup: Return in about 6 months (around 05/25/2024).  Lynwood Rush, MD 11/23/2023 4:49 PM [facility] Internal Medicine

## 2023-11-23 NOTE — Assessment & Plan Note (Signed)
Lab Results  Component Value Date   HGBA1C 5.6 08/17/2020   Stable, pt to continue current medical treatment  - diet, wt control

## 2023-11-23 NOTE — Progress Notes (Signed)
 The test results show that your current treatment is OK, as the tests are stable.  Please continue the same plan.  There is no other need for change of treatment or further evaluation based on these results, at this time.  thanks

## 2023-11-23 NOTE — Assessment & Plan Note (Signed)
Lab Results  ?Component Value Date  ? [id number] 08/20/2021  ? ?Stable, cont oral replacement - b12 1000 mcg qd ? ?

## 2023-11-23 NOTE — Assessment & Plan Note (Signed)
Stable, cont current med tx

## 2023-11-23 NOTE — Assessment & Plan Note (Signed)
 Lab Results  Component Value Date   CREATININE 2.53 (H) 02/09/2022   Stable overall, cont to avoid nephrotoxins, for f/u lab, f/u renal as planned

## 2023-11-23 NOTE — Assessment & Plan Note (Signed)
Last vitamin D Lab Results  Component Value Date   VD25OH 41.74 08/17/2020   Stable, cont oral replacement  

## 2023-11-23 NOTE — Assessment & Plan Note (Signed)
 Lab Results  Component Value Date   LDLCALC 61 11/20/2022   Stable, pt to continue current statin zetia  10 mg and crestor  5 mg every day, for f/u lab today

## 2023-11-26 ENCOUNTER — Other Ambulatory Visit: Payer: Self-pay | Admitting: Internal Medicine

## 2023-12-29 DIAGNOSIS — N184 Chronic kidney disease, stage 4 (severe): Secondary | ICD-10-CM | POA: Diagnosis not present

## 2024-01-04 DIAGNOSIS — G20A1 Parkinson's disease without dyskinesia, without mention of fluctuations: Secondary | ICD-10-CM | POA: Diagnosis not present

## 2024-01-04 DIAGNOSIS — E785 Hyperlipidemia, unspecified: Secondary | ICD-10-CM | POA: Diagnosis not present

## 2024-01-04 DIAGNOSIS — G903 Multi-system degeneration of the autonomic nervous system: Secondary | ICD-10-CM | POA: Diagnosis not present

## 2024-01-04 DIAGNOSIS — N184 Chronic kidney disease, stage 4 (severe): Secondary | ICD-10-CM | POA: Diagnosis not present

## 2024-01-04 DIAGNOSIS — R399 Unspecified symptoms and signs involving the genitourinary system: Secondary | ICD-10-CM | POA: Diagnosis not present

## 2024-02-16 ENCOUNTER — Encounter (HOSPITAL_COMMUNITY): Payer: Self-pay

## 2024-02-16 ENCOUNTER — Emergency Department (HOSPITAL_COMMUNITY)
Admission: EM | Admit: 2024-02-16 | Discharge: 2024-02-16 | Disposition: A | Discharge status: Home / Self Care | Attending: Emergency Medicine | Admitting: Emergency Medicine

## 2024-02-16 ENCOUNTER — Other Ambulatory Visit: Payer: Self-pay

## 2024-02-16 ENCOUNTER — Emergency Department (HOSPITAL_COMMUNITY)

## 2024-02-16 DIAGNOSIS — Z7982 Long term (current) use of aspirin: Secondary | ICD-10-CM | POA: Diagnosis not present

## 2024-02-16 DIAGNOSIS — T360X5A Adverse effect of penicillins, initial encounter: Secondary | ICD-10-CM | POA: Diagnosis not present

## 2024-02-16 DIAGNOSIS — R059 Cough, unspecified: Secondary | ICD-10-CM | POA: Insufficient documentation

## 2024-02-16 DIAGNOSIS — E860 Dehydration: Secondary | ICD-10-CM | POA: Insufficient documentation

## 2024-02-16 DIAGNOSIS — T50905A Adverse effect of unspecified drugs, medicaments and biological substances, initial encounter: Secondary | ICD-10-CM

## 2024-02-16 DIAGNOSIS — N189 Chronic kidney disease, unspecified: Secondary | ICD-10-CM | POA: Insufficient documentation

## 2024-02-16 DIAGNOSIS — G20A1 Parkinson's disease without dyskinesia, without mention of fluctuations: Secondary | ICD-10-CM | POA: Diagnosis not present

## 2024-02-16 DIAGNOSIS — D649 Anemia, unspecified: Secondary | ICD-10-CM | POA: Insufficient documentation

## 2024-02-16 LAB — CBC WITH DIFFERENTIAL/PLATELET
Abs Immature Granulocytes: 0.02 K/uL (ref 0.00–0.07)
Basophils Absolute: 0.1 K/uL (ref 0.0–0.1)
Basophils Relative: 1 %
Eosinophils Absolute: 0.2 K/uL (ref 0.0–0.5)
Eosinophils Relative: 2 %
HCT: 33.9 % — ABNORMAL LOW (ref 39.0–52.0)
Hemoglobin: 11.1 g/dL — ABNORMAL LOW (ref 13.0–17.0)
Immature Granulocytes: 0 %
Lymphocytes Relative: 15 %
Lymphs Abs: 1.2 K/uL (ref 0.7–4.0)
MCH: 31.4 pg (ref 26.0–34.0)
MCHC: 32.7 g/dL (ref 30.0–36.0)
MCV: 95.8 fL (ref 80.0–100.0)
Monocytes Absolute: 0.6 K/uL (ref 0.1–1.0)
Monocytes Relative: 7 %
Neutro Abs: 5.7 K/uL (ref 1.7–7.7)
Neutrophils Relative %: 75 %
Platelets: 194 K/uL (ref 150–400)
RBC: 3.54 MIL/uL — ABNORMAL LOW (ref 4.22–5.81)
RDW: 14.1 % (ref 11.5–15.5)
WBC: 7.6 K/uL (ref 4.0–10.5)
nRBC: 0 % (ref 0.0–0.2)

## 2024-02-16 LAB — I-STAT CHEM 8, ED
BUN: 37 mg/dL — ABNORMAL HIGH (ref 8–23)
Calcium, Ion: 1.32 mmol/L (ref 1.15–1.40)
Chloride: 113 mmol/L — ABNORMAL HIGH (ref 98–111)
Creatinine, Ser: 2.2 mg/dL — ABNORMAL HIGH (ref 0.61–1.24)
Glucose, Bld: 97 mg/dL (ref 70–99)
HCT: 32 % — ABNORMAL LOW (ref 39.0–52.0)
Hemoglobin: 10.9 g/dL — ABNORMAL LOW (ref 13.0–17.0)
Potassium: 4.3 mmol/L (ref 3.5–5.1)
Sodium: 147 mmol/L — ABNORMAL HIGH (ref 135–145)
TCO2: 20 mmol/L — ABNORMAL LOW (ref 22–32)

## 2024-02-16 LAB — URINALYSIS, ROUTINE W REFLEX MICROSCOPIC
Bilirubin Urine: NEGATIVE
Glucose, UA: NEGATIVE mg/dL
Hgb urine dipstick: NEGATIVE
Ketones, ur: 5 mg/dL — AB
Leukocytes,Ua: NEGATIVE
Nitrite: NEGATIVE
Protein, ur: 30 mg/dL — AB
Specific Gravity, Urine: 1.021 (ref 1.005–1.030)
pH: 5 (ref 5.0–8.0)

## 2024-02-16 LAB — COMPREHENSIVE METABOLIC PANEL WITH GFR
ALT: <5 U/L (ref 0–44)
AST: 21 U/L (ref 15–41)
Albumin: 3.8 g/dL (ref 3.5–5.0)
Alkaline Phosphatase: 117 U/L (ref 38–126)
Anion gap: 7 (ref 5–15)
BUN: 38 mg/dL — ABNORMAL HIGH (ref 8–23)
CO2: 25 mmol/L (ref 22–32)
Calcium: 9.5 mg/dL (ref 8.9–10.3)
Chloride: 113 mmol/L — ABNORMAL HIGH (ref 98–111)
Creatinine, Ser: 2.05 mg/dL — ABNORMAL HIGH (ref 0.61–1.24)
GFR, Estimated: 34 mL/min — ABNORMAL LOW (ref >60)
Glucose, Bld: 99 mg/dL (ref 70–99)
Potassium: 4.3 mmol/L (ref 3.5–5.1)
Sodium: 145 mmol/L (ref 135–145)
Total Bilirubin: 0.3 mg/dL (ref 0.0–1.2)
Total Protein: 6.5 g/dL (ref 6.5–8.1)

## 2024-02-16 LAB — RESP PANEL BY RT-PCR (RSV, FLU A&B, COVID)  RVPGX2
Influenza A by PCR: NEGATIVE
Influenza B by PCR: NEGATIVE
Resp Syncytial Virus by PCR: NEGATIVE
SARS Coronavirus 2 by RT PCR: NEGATIVE

## 2024-02-16 LAB — CBG MONITORING, ED: Glucose-Capillary: 87 mg/dL (ref 70–99)

## 2024-02-16 LAB — I-STAT CG4 LACTIC ACID, ED: Lactic Acid, Venous: 0.8 mmol/L (ref 0.5–1.9)

## 2024-02-16 MED ORDER — SODIUM CHLORIDE 0.9 % IV BOLUS
500.0000 mL | Freq: Once | INTRAVENOUS | Status: AC
  Administered 2024-02-16: 500 mL via INTRAVENOUS

## 2024-02-16 NOTE — ED Triage Notes (Signed)
 Pt BIB EMS from [facility], Hospice/ Palliative Care due to rapid decline over past few days. Pt at baseline EMS reports pt eat, talks, and raises head independently. GCS of 8 with EMS. Pt has Brain stimulator for Parkinson's. Pt seems to respond better to yes and no questions.  BP 129/86 HR 75 SpO2 97% RA

## 2024-02-16 NOTE — Discharge Instructions (Signed)
 Do not give pt the day time dose of Seroquel.  Decrease the Seroquel dose to 12.5 mg at night.

## 2024-02-16 NOTE — ED Provider Notes (Signed)
 [place] EMERGENCY DEPARTMENT AT [facility] Provider Note   CSN: [id number] Arrival date & time: 02/16/24  1718     Patient presents with: Failure To Thrive   John Herring is a 73 y.o. male.   Pt is a 73 yo male with pmhx significant for end stage Parkinson's disease, HLD, Depression, arthritis, GERD, CKD, and hx kidney stones.  Pt's wife said he's on hospice, but she wants treatment for anything that can be treated.  He received a flu shot about 1.5 week ago and started getting a cough.  He was started on Augmentin (today is his 5th day).  Pt's wife said he was started on Seroquel BID and that made him very tired during the day.  The facility recently changed it to just at night.  Pt's wife said he is getting less resposnive and she wanted him checked.       Prior to Admission medications   Medication Sig Start Date End Date Taking? Authorizing Provider  aspirin  EC 81 MG tablet Take 1 tablet (81 mg total) by mouth daily. 02/21/19   Jeffrie Oneil BROCKS, MD  carbidopa -levodopa  (SINEMET  IR) 25-100 MG tablet Take 1 tablet by mouth 6 (six) times daily. Taking 1 tablet every 2-3 hours daily    [provider]  Cholecalciferol (VITAMIN D3) 50 MCG (2000 UT) TABS Take 50 mcg by mouth daily.    [provider]  clonazePAM (KLONOPIN) 0.5 MG tablet Take 1/2 tab at bedtime x 1 week, then increase to 1 tab at bedtime 03/05/22   [provider]  cloNIDine (CATAPRES) 0.1 MG tablet Take 0.1 mg by mouth 2 (two) times daily. 11/27/21   [provider]  COMIRNATY SUSP injection Inject into the muscle. 02/12/22   [provider]  dextromethorphan (DELSYM) 30 MG/5ML liquid Take by mouth.    [provider]  ezetimibe  (ZETIA ) 10 MG tablet Take 1 tablet (10 mg total) by mouth daily. 03/09/23   Norleen Lynwood ORN, MD  finasteride (PROSCAR) 5 MG tablet = 1 Tab, ORAL, QEvening, 0 Refill(s), Maintenance 05/13/20   [provider]  fludrocortisone  (FLORINEF) 0.1 MG tablet Take by mouth daily. 05/18/21   [provider]  midodrine  (PROAMATINE ) 5 MG tablet TAKE 1 TABLET BY MOUTH 3 TIMES DAILY WITH MEALS. 09/24/21   Norleen Lynwood ORN, MD  mirabegron  ER (MYRBETRIQ ) 50 MG TB24 tablet Take 1 tablet (50 mg total) by mouth daily. 08/20/21   Norleen Lynwood ORN, MD  rivastigmine (EXELON) 9.5 mg/24hr 1 Patch, TOP, DAILY, 0 Refill(s), Maintenance 12/04/20   [provider]  rosuvastatin  (CRESTOR ) 5 MG tablet TAKE 1 TABLET (5 MG TOTAL) BY MOUTH DAILY. PLEASE CALL [phone] TO SCHEDULE AN APPOINTMENT FOR FUTURE REFILLS. THANK YOU. 2ND ATTEMPT. 03/16/23   Jeffrie Oneil BROCKS, MD  triamcinolone  cream (KENALOG) 0.1 % Apply 1 application. topically 2 (two) times daily. 07/02/21   [provider]    Allergies: Statins and Tramadol     Review of Systems  Unable to perform ROS: Mental status change  All other systems reviewed and are negative.   Updated Vital Signs BP (!) 173/93   Pulse 79   Temp 98.4 F (36.9 C) (Oral)   Resp 16   SpO2 99%   Physical Exam Vitals and nursing note reviewed.  Constitutional:      Appearance: Normal appearance.  HENT:     Head: Normocephalic and atraumatic.     Right Ear: External ear normal.  Left Ear: External ear normal.     Nose: Nose normal.     Mouth/Throat:     Mouth: Mucous membranes are moist.     Pharynx: Oropharynx is clear.  Eyes:     Extraocular Movements: Extraocular movements intact.     Conjunctiva/sclera: Conjunctivae normal.     Pupils: Pupils are equal, round, and reactive to light.  Cardiovascular:     Rate and Rhythm: Normal rate and regular rhythm.     Pulses: Normal pulses.     Heart sounds: Normal heart sounds.  Pulmonary:     Effort: Pulmonary effort is normal.     Breath sounds: Normal breath sounds.  Abdominal:     General: Abdomen is flat. Bowel sounds are normal.     Palpations: Abdomen is soft.  Musculoskeletal:        General: Normal range of motion.      Cervical back: Normal range of motion and neck supple.  Skin:    General: Skin is warm.     Capillary Refill: Capillary refill takes less than 2 seconds.  Neurological:     Mental Status: He is alert. He is disoriented.     Comments: Pt responds to name.  He will follow simple commands.  Psychiatric:        Mood and Affect: Mood normal.     (all labs ordered are listed, but only abnormal results are displayed) Labs Reviewed  CBC WITH DIFFERENTIAL/PLATELET - Abnormal; Notable for the following components:      Result Value   RBC 3.54 (*)    Hemoglobin 11.1 (*)    HCT 33.9 (*)    All other components within normal limits  COMPREHENSIVE METABOLIC PANEL WITH GFR - Abnormal; Notable for the following components:   Chloride 113 (*)    BUN 38 (*)    Creatinine, Ser 2.05 (*)    GFR, Estimated 34 (*)    All other components within normal limits  URINALYSIS, ROUTINE W REFLEX MICROSCOPIC - Abnormal; Notable for the following components:   Ketones, ur 5 (*)    Protein, ur 30 (*)    Bacteria, UA RARE (*)    All other components within normal limits  I-STAT CHEM 8, ED - Abnormal; Notable for the following components:   Sodium 147 (*)    Chloride 113 (*)    BUN 37 (*)    Creatinine, Ser 2.20 (*)    TCO2 20 (*)    Hemoglobin 10.9 (*)    HCT 32.0 (*)    All other components within normal limits  RESP PANEL BY RT-PCR (RSV, FLU A&B, COVID)  RVPGX2  CBG MONITORING, ED  I-STAT CG4 LACTIC ACID, ED    EKG: EKG Interpretation Date/Time:  Tuesday February 16 2024 17:56:36 EDT Ventricular Rate:  77 PR Interval:  162 QRS Duration:  110 QT Interval:  368 QTC Calculation: 417 R Axis:   5  Text Interpretation: Sinus rhythm Abnormal R-wave progression, early transition LVH with IVCD and secondary repol abnrm Artifact in lead(s) I V1 No significant change since last tracing Confirmed by Dean Clarity [phone]) on 02/16/2024 6:17:00 PM  Radiology: CT Head Wo Contrast Result Date:  02/16/2024 EXAM: CT HEAD WITHOUT CONTRAST 02/16/2024 06:47:15 PM TECHNIQUE: CT of the head was performed without the administration of intravenous contrast. Automated exposure control, iterative reconstruction, and/or weight based adjustment of the mA/kV was utilized to reduce the radiation dose to as low as reasonably achievable. COMPARISON: Prior CT from 02/09/2022.  CLINICAL HISTORY: Mental status change, unknown cause. Per chart: Pt BIB EMS from [facility], Hospice/ Palliative Care due to rapid decline over past few days. Pt at baseline EMS reports pt eat, talks, and raises head independently. GCS of 8 with EMS. Pt has Brain stimulator for Parkinson's. Pt ; seems to respond better to yes and no questions. FINDINGS: BRAIN AND VENTRICLES: No acute hemorrhage. No evidence of acute infarct. No hydrocephalus. No extra-axial collection. No mass effect or midline shift. Deep brain stimulator leads terminating in bilateral basal ganglia. Associated streak artifact. Underlying age related cerebral atrophy with chronic microvascular ischemic disease. ORBITS: No acute abnormality. SINUSES: No acute abnormality. SOFT TISSUES AND SKULL: No acute soft tissue abnormality. No skull fracture. Bifrontal burr holes. IMPRESSION: 1. No acute intracranial abnormality. 2. Deep brain stimulator hardware with expected streak artifact and bifrontal burr holes. 3. Cerebral atrophy and chronic microvascular ischemic disease. Electronically signed by: Morene Hoard MD 02/16/2024 07:06 PM EDT RP Workstation: [id number]   DG Chest Port 1 View Result Date: 02/16/2024 EXAM: 1 VIEW(S) XRAY OF THE CHEST 02/16/2024 05:58:00 PM COMPARISON: Chest x-ray 02/11/1999 and 02/10/2022. CLINICAL HISTORY: [id number] AMS (altered mental status) [id number]. Failure to thrive/ams- Pt BIB EMS from [facility], Hospice/ Palliative Care due to rapid decline over past few days. FINDINGS: LINES, TUBES AND DEVICES: Generator overlies the  right chest. LUNGS AND PLEURA: No focal pulmonary opacity. No pulmonary edema. No pleural effusion. No pneumothorax. HEART AND MEDIASTINUM: No acute abnormality of the cardiac and mediastinal silhouettes. BONES AND SOFT TISSUES: Degenerative changes of the left shoulder. No acute osseous abnormality. IMPRESSION: 1. No acute process. Electronically signed by: Greig Pique MD 02/16/2024 06:02 PM EDT RP Workstation: [id number]     Procedures   Medications Ordered in the ED  sodium chloride  0.9 % bolus 500 mL (500 mLs Intravenous New Bag/Given 02/16/24 1825)                                    Medical Decision Making Amount and/or Complexity of Data Reviewed Labs: ordered. Radiology: ordered.   This patient presents to the ED for concern of ams, this involves an extensive number of treatment options, and is a complaint that carries with it a high risk of complications and morbidity.  The differential diagnosis includes medication reaction, infection, head injury   Co morbidities that complicate the patient evaluation  end stage Parkinson's disease, HLD, Depression, arthritis, GERD, CKD, and hx kidney stones   Additional history obtained:  Additional history obtained from epic chart review External records from outside source obtained and reviewed including wife/EMS report   Lab Tests:  I Ordered, and personally interpreted labs.  The pertinent results include:  cbc with hgb 11.1 (stable); ua neg for uti; cmp with cr 2.05 (stable); covid/flu/rsv neg   Imaging Studies ordered:  I ordered imaging studies including cxr and ct head  I independently visualized and interpreted imaging which showed  CXR: No acute process.  CT head: 1. No acute intracranial abnormality.  2. Deep brain stimulator hardware with expected streak artifact and bifrontal  burr holes.  3. Cerebral atrophy and chronic microvascular ischemic disease.   I agree with the radiologist  interpretation   Cardiac Monitoring:  The patient was maintained on a cardiac monitor.  I personally viewed and interpreted the cardiac monitored which showed an underlying rhythm of: nsr   Medicines ordered and prescription drug management:  I ordered medication including ivfs  for sx  Reevaluation of the patient after these medicines showed that the patient improved I have reviewed the patients home medicines and have made adjustments as needed   Test Considered:  ct   Critical Interventions:  ivfs   Problem List / ED Course:  Sedation:  likely due to seroquel.  Recommend decrease dose.     Reevaluation:  After the interventions noted above, I reevaluated the patient and found that they have :improved   Social Determinants of Health:  Lives at facility   Dispostion:  After consideration of the diagnostic results and the patients response to treatment, I feel that the patent would benefit from discharge with outpatient f/u.       Final diagnoses:  Dehydration  Adverse effect of drug, initial encounter    ED Discharge Orders     None          Dean Clarity, MD 02/16/24 2033

## 2024-04-11 ENCOUNTER — Emergency Department (HOSPITAL_COMMUNITY)

## 2024-04-11 ENCOUNTER — Other Ambulatory Visit: Payer: Self-pay

## 2024-04-11 ENCOUNTER — Emergency Department (HOSPITAL_COMMUNITY)
Admission: EM | Admit: 2024-04-11 | Discharge: 2024-04-11 | Disposition: A | Discharge status: Home / Self Care | Attending: Emergency Medicine | Admitting: Emergency Medicine

## 2024-04-11 DIAGNOSIS — S0101XA Laceration without foreign body of scalp, initial encounter: Secondary | ICD-10-CM | POA: Insufficient documentation

## 2024-04-11 DIAGNOSIS — W06XXXA Fall from bed, initial encounter: Secondary | ICD-10-CM | POA: Insufficient documentation

## 2024-04-11 DIAGNOSIS — Y92019 Unspecified place in single-family (private) house as the place of occurrence of the external cause: Secondary | ICD-10-CM | POA: Insufficient documentation

## 2024-04-11 DIAGNOSIS — S0990XA Unspecified injury of head, initial encounter: Secondary | ICD-10-CM | POA: Diagnosis present

## 2024-04-11 DIAGNOSIS — Z7982 Long term (current) use of aspirin: Secondary | ICD-10-CM | POA: Diagnosis not present

## 2024-04-11 DIAGNOSIS — W19XXXA Unspecified fall, initial encounter: Secondary | ICD-10-CM

## 2024-04-11 DIAGNOSIS — Z79899 Other long term (current) drug therapy: Secondary | ICD-10-CM | POA: Diagnosis not present

## 2024-04-11 MED ORDER — LIDOCAINE-EPINEPHRINE-TETRACAINE (LET) TOPICAL GEL
3.0000 mL | Freq: Once | TOPICAL | Status: AC
  Administered 2024-04-11: 3 mL via TOPICAL
  Filled 2024-04-11: qty 3

## 2024-04-11 NOTE — ED Provider Notes (Signed)
 " [place] EMERGENCY DEPARTMENT AT [facility] Provider Note   CSN: [id number] Arrival date & time: 04/11/24  1244     Patient presents with: No chief complaint on file.   John Herring is a 73 y.o. male.   HPI Pt brought in [facility] for fall with a head injury. Fell off bedside commode. Pt is in a residential home for hospice pts. Fall was unwitnessed. Pt at baseline. PT hypotensive when EMS arrived.     Vitals BP 122/70 after 500 NS 70 HR O2 96% RA CBG170  Patient denies pain, seemingly is aware of where he is. History obtained by EMS at bedside.     Prior to Admission medications  Medication Sig Start Date End Date Taking? Authorizing Provider  aspirin  EC 81 MG tablet Take 1 tablet (81 mg total) by mouth daily. 02/21/19   Jeffrie Oneil BROCKS, MD  carbidopa -levodopa  (SINEMET  IR) 25-100 MG tablet Take 1 tablet by mouth 6 (six) times daily. Taking 1 tablet every 2-3 hours daily    [provider]  Cholecalciferol (VITAMIN D3) 50 MCG (2000 UT) TABS Take 50 mcg by mouth daily.    [provider]  clonazePAM (KLONOPIN) 0.5 MG tablet Take 1/2 tab at bedtime x 1 week, then increase to 1 tab at bedtime 03/05/22   [provider]  cloNIDine (CATAPRES) 0.1 MG tablet Take 0.1 mg by mouth 2 (two) times daily. 11/27/21   [provider]  COMIRNATY SUSP injection Inject into the muscle. 02/12/22   [provider]  dextromethorphan (DELSYM) 30 MG/5ML liquid Take by mouth.    [provider]  ezetimibe  (ZETIA ) 10 MG tablet Take 1 tablet (10 mg total) by mouth daily. 03/09/23   Norleen Lynwood ORN, MD  finasteride (PROSCAR) 5 MG tablet = 1 Tab, ORAL, QEvening, 0 Refill(s), Maintenance 05/13/20   [provider]  fludrocortisone (FLORINEF) 0.1 MG tablet Take by mouth daily. 05/18/21   [provider]  midodrine  (PROAMATINE ) 5 MG tablet TAKE 1 TABLET BY MOUTH 3 TIMES DAILY WITH MEALS. 09/24/21   Norleen Lynwood ORN, MD  mirabegron  ER  (MYRBETRIQ ) 50 MG TB24 tablet Take 1 tablet (50 mg total) by mouth daily. 08/20/21   Norleen Lynwood ORN, MD  rivastigmine (EXELON) 9.5 mg/24hr 1 Patch, TOP, DAILY, 0 Refill(s), Maintenance 12/04/20   [provider]  rosuvastatin  (CRESTOR ) 5 MG tablet TAKE 1 TABLET (5 MG TOTAL) BY MOUTH DAILY. PLEASE CALL [phone] TO SCHEDULE AN APPOINTMENT FOR FUTURE REFILLS. THANK YOU. 2ND ATTEMPT. 03/16/23   Jeffrie Oneil BROCKS, MD  triamcinolone  cream (KENALOG) 0.1 % Apply 1 application. topically 2 (two) times daily. 07/02/21   [provider]    Allergies: Statins and Tramadol     Review of Systems  Updated Vital Signs BP (!) 145/84   Pulse 72   Temp 98 F (36.7 C) (Oral)   Resp 16   Ht 1.803 m (5' 11)   Wt 67.1 kg   SpO2 100%   BMI 20.64 kg/m   Physical Exam Vitals and nursing note reviewed.  Constitutional:      General: He is not in acute distress.    Appearance: He is well-developed.  HENT:     Head: Normocephalic.   Eyes:     Conjunctiva/sclera: Conjunctivae normal.  Cardiovascular:     Rate and Rhythm: Normal rate and regular rhythm.  Pulmonary:     Effort: Pulmonary effort is normal. No respiratory distress.     Breath sounds:  No stridor.  Abdominal:     General: There is no distension.  Skin:    General: Skin is warm and dry.  Neurological:     Mental Status: He is alert.     Cranial Nerves: No facial asymmetry.     Motor: Atrophy present.     (all labs ordered are listed, but only abnormal results are displayed) Labs Reviewed - No data to display  EKG: None  Radiology: CT Cervical Spine Wo Contrast Result Date: 04/11/2024 EXAM: CT CERVICAL SPINE WITHOUT CONTRAST 04/11/2024 01:39:02 PM TECHNIQUE: CT of the cervical spine was performed without the administration of intravenous contrast. Multiplanar reformatted images are provided for review. Automated exposure control, iterative reconstruction, and/or weight based adjustment of the mA/kV was utilized to  reduce the radiation dose to as low as reasonably achievable. COMPARISON: CT cervical spine 02/09/2022. CLINICAL HISTORY: Polytrauma, blunt. FINDINGS: BONES AND ALIGNMENT: No acute fracture or traumatic malalignment. Unchanged 3 mm retrolisthesis of C3 on C4. Unchanged 3 mm anterolisthesis of C4 on C5 and C5 on C6. DEGENERATIVE CHANGES: Unchanged severe, right greater than left upper cervical facet arthropathy with heterotopic ossification about the C4-C5 facet joint on the right. SOFT TISSUES: No prevertebral soft tissue swelling. IMPRESSION: 1. No acute fracture or traumatic malalignment of the cervical spine. Electronically signed by: Ryan Chess MD 04/11/2024 02:16 PM EST RP Workstation: [id number]   CT Head Wo Contrast Result Date: 04/11/2024 EXAM: CT HEAD WITHOUT CONTRAST 04/11/2024 01:39:02 PM TECHNIQUE: CT of the head was performed without the administration of intravenous contrast. Automated exposure control, iterative reconstruction, and/or weight based adjustment of the mA/kV was utilized to reduce the radiation dose to as low as reasonably achievable. COMPARISON: CT head 02/16/2024. CLINICAL HISTORY: Polytrauma, blunt. FINDINGS: BRAIN AND VENTRICLES: Unchanged frontal approach deep brain stimulation electrodes terminating in the expected location of the subthalamic nuclei bilaterally. No acute hemorrhage. No evidence of acute infarct. No hydrocephalus. No extra-axial collection. No mass effect or midline shift. ORBITS: No acute abnormality. SINUSES: No acute abnormality. SOFT TISSUES AND SKULL: No acute soft tissue abnormality. No skull fracture. IMPRESSION: 1. No acute intracranial abnormality. 2. Unchanged frontal approach deep brain stimulation electrodes terminating in the expected location of the subthalamic nuclei bilaterally. Electronically signed by: Ryan Chess MD 04/11/2024 02:13 PM EST RP Workstation: [id number]     Procedures   Medications Ordered in the ED   lidocaine -EPINEPHrine -tetracaine  (LET) topical gel (3 mLs Topical Given 04/11/24 1500)                                    Medical Decision Making Elderly male with dementia presents after mechanical fall at his facility. Patient is awake, alert, hemodynamically unremarkable, has head injury with active bleeding, concern for intracranial abnormality, fracture, cutaneous injury. Patient's vital signs, absence of history suggesting new concurrent malady reassuring. Cardiac 70 sinus normal pulse ox 100% room air normal  Amount and/or Complexity of Data Reviewed Independent Historian: EMS External Data Reviewed: notes. Radiology: ordered and independent interpretation performed. Decision-making details documented in ED Course.  Risk Decision regarding hospitalization. Diagnosis or treatment significantly limited by social determinants of health.   3:08 PM Patient coming by his wife.  We reviewed and discussed the CT imaging, no intracranial abnormality, no cervical spine fracture.  She notes the patient has had multiple prior falls, has had repair. We discussed indication for repair, now on repeat exam, after removal of the  dressing, patient has a 4 cm curvilinear laceration with a distal area of skin avulsion.  Patient tolerated laceration repair with staples without complication, had let applied for some superficial pain.  With reassuring findings, no intracranial abnormality, unremarkable vitals and no decompensation in hours here, patient discharged back to his facility.   LACERATION REPAIR Performed by: Lamar Salen Authorized by: Lamar Salen Consent: Verbal consent obtained. Risks and benefits: risks, benefits and alternatives were discussed Consent given by: patient Patient identity confirmed: provided demographic data Prepped and Draped in normal sterile fashion Wound explored  Laceration Location: Scalp  Laceration Length: 4cm  No Foreign Bodies seen or  palpated   Irrigation method: syringe Amount of cleaning: standard  Skin closure: 2 staples    Technique: close  Patient tolerance: Patient tolerated the procedure well with no immediate complications.      Final diagnoses:  Fall, initial encounter  Injury of head, initial encounter    ED Discharge Orders     None          Salen Lamar, MD 04/11/24 1508  "

## 2024-04-11 NOTE — ED Triage Notes (Signed)
 Pt brought in [facility] for fall with a head injury. Fell off bedside commode. Pt is in a residential home for hospice pts. Fall was unwitnessed. Pt at baseline. PT hypotensive when EMS arrived.   Vitals BP 122/70 after 500 NS 70 HR O2 96% RA CBG170

## 2024-04-11 NOTE — Discharge Instructions (Addendum)
 Monitor your condition carefully and do not hesitate to return here.  Otherwise follow-up with your physician in 1 week for staple removal.

## 2024-04-11 NOTE — ED Notes (Signed)
 Wife at bedside and says she will be transporting pt back to facility. Discharge instructions was given to wife and she verbalized understanding.

## 2024-04-11 NOTE — Progress Notes (Signed)
 [facility] Liaison Note  This patient is a current hospice patient with [facility], admitted for Parkinsons disease.  Istvan Behar [phone] is his primary caregiver.  We will continue to follow for any discharge planning needs and to coordinate continuation of hospice care.  Please don't hesitate to call with any hospice related questions or concerns.  Thank you Inocente Jacobs RN BSN [facility] Liaison [phone]

## 2024-05-25 ENCOUNTER — Ambulatory Visit: Admitting: Internal Medicine

## 2024-08-26 ENCOUNTER — Ambulatory Visit
